# Patient Record
Sex: Female | Born: 1970 | Race: White | Hispanic: No | Marital: Single | Smoking: Current every day smoker
Health system: Southern US, Community
[De-identification: ages and names within clinical notes are randomized; demographics above are authoritative.]

## PROBLEM LIST (undated history)

## (undated) DIAGNOSIS — F1410 Cocaine abuse, uncomplicated: Secondary | ICD-10-CM

## (undated) DIAGNOSIS — F1110 Opioid abuse, uncomplicated: Secondary | ICD-10-CM

## (undated) DIAGNOSIS — K122 Cellulitis and abscess of mouth: Secondary | ICD-10-CM

## (undated) DIAGNOSIS — F1190 Opioid use, unspecified, uncomplicated: Secondary | ICD-10-CM

## (undated) DIAGNOSIS — F329 Major depressive disorder, single episode, unspecified: Secondary | ICD-10-CM

## (undated) DIAGNOSIS — F1120 Opioid dependence, uncomplicated: Secondary | ICD-10-CM

## (undated) DIAGNOSIS — I10 Essential (primary) hypertension: Secondary | ICD-10-CM

## (undated) DIAGNOSIS — F1310 Sedative, hypnotic or anxiolytic abuse, uncomplicated: Secondary | ICD-10-CM

## (undated) DIAGNOSIS — F1010 Alcohol abuse, uncomplicated: Secondary | ICD-10-CM

## (undated) DIAGNOSIS — F32A Depression, unspecified: Secondary | ICD-10-CM

## (undated) DIAGNOSIS — F419 Anxiety disorder, unspecified: Secondary | ICD-10-CM

## (undated) DIAGNOSIS — B1920 Unspecified viral hepatitis C without hepatic coma: Secondary | ICD-10-CM

---

## 1997-09-23 ENCOUNTER — Encounter: Admission: RE | Admit: 1997-09-23 | Discharge: 1997-09-23 | Payer: Self-pay | Admitting: Sports Medicine

## 1998-04-03 ENCOUNTER — Inpatient Hospital Stay (HOSPITAL_COMMUNITY): Admission: AD | Admit: 1998-04-03 | Discharge: 1998-04-03 | Payer: Self-pay | Admitting: Obstetrics

## 1998-04-05 ENCOUNTER — Ambulatory Visit (HOSPITAL_COMMUNITY): Admission: RE | Admit: 1998-04-05 | Discharge: 1998-04-05 | Payer: Self-pay | Admitting: Obstetrics

## 1999-09-07 ENCOUNTER — Emergency Department (HOSPITAL_COMMUNITY): Admission: EM | Admit: 1999-09-07 | Discharge: 1999-09-08 | Payer: Self-pay | Admitting: Emergency Medicine

## 2000-02-18 ENCOUNTER — Emergency Department (HOSPITAL_COMMUNITY): Admission: EM | Admit: 2000-02-18 | Discharge: 2000-02-18 | Payer: Self-pay

## 2000-08-21 ENCOUNTER — Encounter: Payer: Self-pay | Admitting: *Deleted

## 2000-08-21 ENCOUNTER — Inpatient Hospital Stay (HOSPITAL_COMMUNITY): Admission: AD | Admit: 2000-08-21 | Discharge: 2000-08-21 | Payer: Self-pay | Admitting: *Deleted

## 2000-11-07 ENCOUNTER — Inpatient Hospital Stay (HOSPITAL_COMMUNITY): Admission: AD | Admit: 2000-11-07 | Discharge: 2000-11-09 | Payer: Self-pay | Admitting: *Deleted

## 2000-11-07 ENCOUNTER — Encounter: Payer: Self-pay | Admitting: Obstetrics

## 2000-11-28 ENCOUNTER — Inpatient Hospital Stay (HOSPITAL_COMMUNITY): Admission: AD | Admit: 2000-11-28 | Discharge: 2000-11-30 | Payer: Self-pay | Admitting: *Deleted

## 2002-02-09 ENCOUNTER — Encounter: Payer: Self-pay | Admitting: Obstetrics and Gynecology

## 2002-02-09 ENCOUNTER — Inpatient Hospital Stay (HOSPITAL_COMMUNITY): Admission: AD | Admit: 2002-02-09 | Discharge: 2002-02-09 | Payer: Self-pay | Admitting: Obstetrics and Gynecology

## 2002-03-19 ENCOUNTER — Emergency Department (HOSPITAL_COMMUNITY): Admission: EM | Admit: 2002-03-19 | Discharge: 2002-03-19 | Payer: Self-pay | Admitting: Emergency Medicine

## 2002-05-13 ENCOUNTER — Emergency Department (HOSPITAL_COMMUNITY): Admission: EM | Admit: 2002-05-13 | Discharge: 2002-05-13 | Payer: Self-pay | Admitting: Emergency Medicine

## 2003-04-18 ENCOUNTER — Emergency Department (HOSPITAL_COMMUNITY): Admission: EM | Admit: 2003-04-18 | Discharge: 2003-04-18 | Payer: Self-pay | Admitting: Emergency Medicine

## 2003-06-03 ENCOUNTER — Emergency Department (HOSPITAL_COMMUNITY): Admission: EM | Admit: 2003-06-03 | Discharge: 2003-06-03 | Payer: Self-pay | Admitting: Emergency Medicine

## 2003-11-05 ENCOUNTER — Emergency Department (HOSPITAL_COMMUNITY): Admission: EM | Admit: 2003-11-05 | Discharge: 2003-11-05 | Payer: Self-pay | Admitting: Emergency Medicine

## 2004-02-18 ENCOUNTER — Emergency Department (HOSPITAL_COMMUNITY): Admission: EM | Admit: 2004-02-18 | Discharge: 2004-02-18 | Payer: Self-pay | Admitting: Emergency Medicine

## 2004-03-30 ENCOUNTER — Emergency Department (HOSPITAL_COMMUNITY): Admission: EM | Admit: 2004-03-30 | Discharge: 2004-03-30 | Payer: Self-pay | Admitting: Emergency Medicine

## 2004-06-26 ENCOUNTER — Emergency Department (HOSPITAL_COMMUNITY): Admission: EM | Admit: 2004-06-26 | Discharge: 2004-06-26 | Payer: Self-pay | Admitting: Emergency Medicine

## 2004-10-13 ENCOUNTER — Emergency Department (HOSPITAL_COMMUNITY): Admission: EM | Admit: 2004-10-13 | Discharge: 2004-10-13 | Payer: Self-pay | Admitting: Emergency Medicine

## 2004-11-18 ENCOUNTER — Emergency Department (HOSPITAL_COMMUNITY): Admission: EM | Admit: 2004-11-18 | Discharge: 2004-11-18 | Payer: Self-pay | Admitting: Emergency Medicine

## 2005-03-29 ENCOUNTER — Emergency Department (HOSPITAL_COMMUNITY): Admission: EM | Admit: 2005-03-29 | Discharge: 2005-03-29 | Payer: Self-pay | Admitting: Family Medicine

## 2005-11-28 ENCOUNTER — Emergency Department (HOSPITAL_COMMUNITY): Admission: EM | Admit: 2005-11-28 | Discharge: 2005-11-28 | Payer: Self-pay | Admitting: Emergency Medicine

## 2008-07-05 ENCOUNTER — Emergency Department (HOSPITAL_COMMUNITY): Admission: EM | Admit: 2008-07-05 | Discharge: 2008-07-05 | Payer: Self-pay | Admitting: Emergency Medicine

## 2008-09-15 ENCOUNTER — Emergency Department (HOSPITAL_COMMUNITY): Admission: EM | Admit: 2008-09-15 | Discharge: 2008-09-15 | Payer: Self-pay | Admitting: Emergency Medicine

## 2008-11-05 ENCOUNTER — Emergency Department (HOSPITAL_COMMUNITY): Admission: EM | Admit: 2008-11-05 | Discharge: 2008-11-05 | Payer: Self-pay | Admitting: Emergency Medicine

## 2009-09-05 ENCOUNTER — Emergency Department (HOSPITAL_COMMUNITY): Admission: EM | Admit: 2009-09-05 | Discharge: 2009-09-05 | Payer: Self-pay | Admitting: Emergency Medicine

## 2009-09-30 ENCOUNTER — Emergency Department (HOSPITAL_COMMUNITY): Admission: EM | Admit: 2009-09-30 | Discharge: 2009-09-30 | Payer: Self-pay | Admitting: Emergency Medicine

## 2009-10-11 ENCOUNTER — Emergency Department (HOSPITAL_COMMUNITY): Admission: EM | Admit: 2009-10-11 | Discharge: 2009-10-11 | Payer: Self-pay | Admitting: Emergency Medicine

## 2009-10-22 ENCOUNTER — Emergency Department (HOSPITAL_COMMUNITY): Admission: EM | Admit: 2009-10-22 | Discharge: 2009-10-22 | Payer: Self-pay | Admitting: Emergency Medicine

## 2009-11-02 ENCOUNTER — Emergency Department (HOSPITAL_COMMUNITY): Admission: EM | Admit: 2009-11-02 | Discharge: 2009-11-02 | Payer: Self-pay | Admitting: Emergency Medicine

## 2009-11-27 ENCOUNTER — Emergency Department (HOSPITAL_COMMUNITY): Admission: EM | Admit: 2009-11-27 | Discharge: 2009-11-27 | Payer: Self-pay | Admitting: Emergency Medicine

## 2009-12-08 ENCOUNTER — Emergency Department (HOSPITAL_COMMUNITY): Admission: EM | Admit: 2009-12-08 | Discharge: 2009-12-09 | Payer: Self-pay | Admitting: Emergency Medicine

## 2010-09-04 LAB — COMPREHENSIVE METABOLIC PANEL
ALT: 24 U/L (ref 0–35)
Albumin: 3.5 g/dL (ref 3.5–5.2)
CO2: 23 mEq/L (ref 19–32)
Chloride: 108 mEq/L (ref 96–112)
GFR calc non Af Amer: >60 mL/min (ref >60)
Glucose, Bld: 89 mg/dL (ref 70–99)
Potassium: 3.9 mEq/L (ref 3.5–5.1)
Sodium: 138 mEq/L (ref 135–145)
Total Bilirubin: 0.3 mg/dL (ref 0.3–1.2)
Total Protein: 7.8 g/dL (ref 6.0–8.3)

## 2010-09-04 LAB — URINALYSIS, ROUTINE W REFLEX MICROSCOPIC
Glucose, UA: NEGATIVE mg/dL
Ketones, ur: NEGATIVE mg/dL
Leukocytes, UA: NEGATIVE
Nitrite: NEGATIVE
Protein, ur: NEGATIVE mg/dL
Urobilinogen, UA: 0.2 mg/dL (ref 0.0–1.0)

## 2010-09-04 LAB — CBC
RDW: 12.7 % (ref 11.5–15.5)
WBC: 8.3 10*3/uL (ref 4.0–10.5)

## 2010-09-04 LAB — DIFFERENTIAL
Eosinophils Absolute: 0 10*3/uL (ref 0.0–0.7)
Lymphs Abs: 2.2 10*3/uL (ref 0.7–4.0)
Neutro Abs: 5.5 10*3/uL (ref 1.7–7.7)
Neutrophils Relative %: 65 % (ref 43–77)

## 2010-09-04 LAB — POCT PREGNANCY, URINE: Preg Test, Ur: NEGATIVE

## 2010-09-06 LAB — URINE MICROSCOPIC-ADD ON

## 2010-09-06 LAB — URINALYSIS, ROUTINE W REFLEX MICROSCOPIC
Nitrite: POSITIVE — AB
Protein, ur: 30 mg/dL — AB
Urobilinogen, UA: 2 mg/dL — ABNORMAL HIGH (ref 0.0–1.0)
pH: 5 (ref 5.0–8.0)

## 2010-09-06 LAB — PREGNANCY, URINE: Preg Test, Ur: NEGATIVE

## 2010-09-29 LAB — DIFFERENTIAL
Band Neutrophils: 0 % (ref 0–10)
Basophils Absolute: 0 10*3/uL (ref 0.0–0.1)
Eosinophils Absolute: 0 10*3/uL (ref 0.0–0.7)
Lymphs Abs: 2.3 10*3/uL (ref 0.7–4.0)
Monocytes Absolute: 0.6 10*3/uL (ref 0.1–1.0)
Monocytes Relative: 10 % (ref 3–12)
Neutro Abs: 3.4 10*3/uL (ref 1.7–7.7)
Neutrophils Relative %: 53 % (ref 43–77)
Promyelocytes Absolute: 0 %

## 2010-09-29 LAB — BASIC METABOLIC PANEL
CO2: 23 mEq/L (ref 19–32)
Creatinine, Ser: 0.66 mg/dL (ref 0.4–1.2)
GFR calc Af Amer: >60 mL/min (ref >60)
Glucose, Bld: 134 mg/dL — ABNORMAL HIGH (ref 70–99)

## 2010-09-29 LAB — CBC
Hemoglobin: 11.5 g/dL — ABNORMAL LOW (ref 12.0–15.0)
Platelets: 258 10*3/uL (ref 150–400)
RDW: 13.5 % (ref 11.5–15.5)

## 2011-06-08 ENCOUNTER — Encounter: Payer: Self-pay | Admitting: *Deleted

## 2011-06-08 ENCOUNTER — Emergency Department (HOSPITAL_COMMUNITY)
Admission: EM | Admit: 2011-06-08 | Discharge: 2011-06-08 | Disposition: A | Discharge status: Home / Self Care | Payer: Self-pay | Attending: Emergency Medicine | Admitting: Emergency Medicine

## 2011-06-08 DIAGNOSIS — H9209 Otalgia, unspecified ear: Secondary | ICD-10-CM | POA: Insufficient documentation

## 2011-06-08 DIAGNOSIS — F17200 Nicotine dependence, unspecified, uncomplicated: Secondary | ICD-10-CM | POA: Insufficient documentation

## 2011-06-08 DIAGNOSIS — K089 Disorder of teeth and supporting structures, unspecified: Secondary | ICD-10-CM | POA: Insufficient documentation

## 2011-06-08 DIAGNOSIS — K0889 Other specified disorders of teeth and supporting structures: Secondary | ICD-10-CM

## 2011-06-08 MED ORDER — PENICILLIN V POTASSIUM 500 MG PO TABS: 500.0000 mg | ORAL_TABLET | Freq: Three times a day (TID) | ORAL | Status: AC

## 2011-06-08 MED ORDER — HYDROCODONE-ACETAMINOPHEN 5-325 MG PO TABS: ORAL_TABLET | ORAL | Status: AC

## 2011-06-08 MED ORDER — IBUPROFEN 800 MG PO TABS: 800.0000 mg | ORAL_TABLET | Freq: Three times a day (TID) | ORAL | Status: AC | PRN

## 2011-06-08 NOTE — ED Provider Notes (Signed)
History     CSN: [id number]  Arrival date & time 06/08/11  1510   First MD Initiated Contact with Patient 06/08/11 1733      Chief Complaint  Patient presents with  . Dental Pain    (Consider location/radiation/quality/duration/timing/severity/associated sxs/prior treatment) HPI Comments: Patient with poor dentition presents with worsening sensation of swelling in right maxillary jaw and tooth pain in right mandibular jaw. She is requesting antibiotics to treat abscess. Patient has had multiple teeth extracted in the past. She denies throat swelling or trouble breathing.  Patient is a 40 y.o. female presenting with tooth pain. The history is provided by the patient.  Dental PainThe primary symptoms include mouth pain. Primary symptoms do not include dental injury, headaches, fever, shortness of breath or sore throat. The symptoms began 2 days ago. The symptoms are worsening.  Additional symptoms include: gum swelling, gum tenderness and ear pain. Additional symptoms do not include: trismus, facial swelling and trouble swallowing.    History reviewed. No pertinent past medical history.  History reviewed. No pertinent past surgical history.  No family history on file.  History  Substance Use Topics  . Smoking status: Current Everyday Smoker  . Smokeless tobacco: Not on file  . Alcohol Use: No    OB History    Grav Para Term Preterm Abortions TAB SAB Ect Mult Living                  Review of Systems  Constitutional: Negative for fever.  HENT: Positive for ear pain. Negative for sore throat, facial swelling, rhinorrhea and trouble swallowing.        Positive for dental pain  Eyes: Negative for discharge.  Respiratory: Negative for shortness of breath.   Skin: Negative for color change.  Neurological: Negative for headaches.    Allergies  Review of patient's allergies indicates no known allergies.  Home Medications   Current Outpatient Rx  Name Route Sig Dispense  Refill  . ACETAMINOPHEN 500 MG PO TABS Oral Take 500 mg by mouth every 6 (six) hours as needed. pain     . HYDROCODONE-ACETAMINOPHEN 5-325 MG PO TABS  Take 1-2 tablets every 6 hours as needed for severe pain 10 tablet 0  . IBUPROFEN 800 MG PO TABS Oral Take 1 tablet (800 mg total) by mouth every 8 (eight) hours as needed for pain. 15 tablet 0  . PENICILLIN V POTASSIUM 500 MG PO TABS Oral Take 1 tablet (500 mg total) by mouth 3 (three) times daily. 21 tablet 0    BP 134/89  Pulse 87  Temp(Src) 98.7 F (37.1 C) (Oral)  Resp 18  SpO2 100%  LMP 05/29/2011  Physical Exam  Nursing note and vitals reviewed. Constitutional: She is oriented to person, place, and time. She appears well-developed and well-nourished.  HENT:  Head: Normocephalic and atraumatic.  Right Ear: External ear normal.  Left Ear: External ear normal.       Patient with R maxillary/mandibular tooth pain and tenderness to palpation in area of right mandibular molar and right maxillary first premolar. No swelling or erythema noted on exam. Teeth are in poor repair, multiple extractions.  Eyes: Conjunctivae are normal.  Neck: Normal range of motion. Neck supple.       No Ludwig's angina  Lymphadenopathy:    She has no cervical adenopathy.  Neurological: She is alert and oriented to person, place, and time.  Skin: Skin is warm and dry.    ED Course  Procedures (including  critical care time)  Labs Reviewed - No data to display No results found.   1. Pain, dental    6:01 PM Patient counseled to take prescribed medications as directed, return with worsening facial or neck swelling, and to follow-up with his dentist as soon as possible.   6:01 PM Patient counseled on use of narcotic pain medications. Counseled not to combine these medications with others containing tylenol. Urged not to drink alcohol, drive, or perform any other activities that requires focus while taking these medications. The patient verbalizes  understanding and agrees with the plan.    MDM  Patient with toothache.  No gross abscess.  Exam unconcerning for Ludwig's angina or other deep tissue infection in neck.  Will treat with penicillin and pain medicine.  Urged patient to follow-up with dentist.          Carolee Rota, PA 06/08/11 [phone]

## 2011-06-08 NOTE — ED Notes (Signed)
Pt states she has a toothache on the right side. Pt states she need abx for her tooth because she can not afford to go to the denitist

## 2011-06-09 NOTE — ED Provider Notes (Signed)
Medical screening examination/treatment/procedure(s) were performed by non-physician practitioner and as supervising physician I was immediately available for consultation/collaboration.   Shelda Jakes, MD 06/09/11 1630

## 2012-02-09 ENCOUNTER — Encounter (HOSPITAL_COMMUNITY): Payer: Self-pay | Admitting: Emergency Medicine

## 2012-02-09 ENCOUNTER — Emergency Department (HOSPITAL_COMMUNITY)
Admission: EM | Admit: 2012-02-09 | Discharge: 2012-02-09 | Disposition: A | Discharge status: Home / Self Care | Payer: Self-pay | Attending: Emergency Medicine | Admitting: Emergency Medicine

## 2012-02-09 DIAGNOSIS — F17200 Nicotine dependence, unspecified, uncomplicated: Secondary | ICD-10-CM | POA: Insufficient documentation

## 2012-02-09 DIAGNOSIS — K089 Disorder of teeth and supporting structures, unspecified: Secondary | ICD-10-CM | POA: Insufficient documentation

## 2012-02-09 DIAGNOSIS — K0889 Other specified disorders of teeth and supporting structures: Secondary | ICD-10-CM

## 2012-02-09 HISTORY — DX: Cellulitis and abscess of mouth: K12.2

## 2012-02-09 MED ORDER — OXYCODONE-ACETAMINOPHEN 5-325 MG PO TABS: 2.0000 | ORAL_TABLET | ORAL | Status: AC | PRN

## 2012-02-09 MED ORDER — PENICILLIN V POTASSIUM 500 MG PO TABS: 500.0000 mg | ORAL_TABLET | Freq: Four times a day (QID) | ORAL | Status: AC

## 2012-02-09 MED ORDER — OXYCODONE-ACETAMINOPHEN 5-325 MG PO TABS
2.0000 | ORAL_TABLET | Freq: Once | ORAL | Status: AC
  Administered 2012-02-09: 2 via ORAL
  Filled 2012-02-09: qty 2

## 2012-02-09 NOTE — ED Notes (Signed)
Patient states she has 2 abscessed teeth but doesn't have insurance and has not seen a dentist

## 2012-02-09 NOTE — ED Provider Notes (Signed)
History     CSN: [id number]  Arrival date & time 02/09/12  1258   First MD Initiated Contact with Patient 02/09/12 1510      Chief Complaint  Patient presents with  . Dental Pain    (Consider location/radiation/quality/duration/timing/severity/associated sxs/prior treatment) Patient is a 41 y.o. female presenting with tooth pain.  Dental PainPrimary symptoms do not include fever or shortness of breath.  Additional symptoms do not include: trouble swallowing.   Alyssa Kelley 41 y.o. female   The chief complaint is:  Chief Complaint  Patient presents with  . Dental Pain    41 year old female with a history of poor dentition and previous dental abscess he presents today with a chief complaint of dental pain. She states that she has several cracked teeth in her mouth. She has pain in the right posterior upper molar and right inferior molar. She denies any discharge from the area. She denies any radiating pain into her ear or jaw. She rates her pain right now to 7/10. She denies any difficulty breathing or swallowing. She denies any fevers shakes chills arthralgias myalgias or other constitutional symptoms indicatung systemic infection     Past Medical History  Diagnosis Date  . Abscess of mouth     History reviewed. No pertinent past surgical history.  History reviewed. No pertinent family history.  History  Substance Use Topics  . Smoking status: Current Everyday Smoker  . Smokeless tobacco: Not on file  . Alcohol Use: No    OB History    Grav Para Term Preterm Abortions TAB SAB Ect Mult Living                  Review of Systems  Constitutional: Negative for fever and chills.  HENT: Negative for trouble swallowing.   Respiratory: Negative for shortness of breath.   Cardiovascular: Negative for chest pain.  Neurological: Negative for light-headedness.    Allergies  Review of patient's allergies indicates no known allergies.  Home Medications   Current  Outpatient Rx  Name Route Sig Dispense Refill  . ACETAMINOPHEN 500 MG PO TABS Oral Take 1,000 mg by mouth every 6 (six) hours as needed. pain    . OXYCODONE-ACETAMINOPHEN 5-325 MG PO TABS Oral Take 2 tablets by mouth every 4 (four) hours as needed for pain. 10 tablet 0  . PENICILLIN V POTASSIUM 500 MG PO TABS Oral Take 1 tablet (500 mg total) by mouth 4 (four) times daily. 40 tablet 0    BP 111/80  Pulse 72  Temp 98.1 F (36.7 C) (Oral)  Resp 20  Ht 4\' 11"  (1.499 m)  Wt 150 lb (68.04 kg)  BMI 30.30 kg/m2  SpO2 97%  LMP 02/07/2012  Physical Exam  Vitals reviewed. Constitutional: She is oriented to person, place, and time. She appears well-developed and well-nourished. No distress.  HENT:  Head: Normocephalic and atraumatic.       Poor dentition with multiple broken teeth. No fluctuance noted in the gumline. Conjunctiva are red and inflamed.  she is tender to palpation. No swelling or pharyngeal erythema noted.  Eyes: Conjunctivae are normal. No scleral icterus.  Neck: Normal range of motion.  Cardiovascular: Normal rate, regular rhythm and normal heart sounds.  Exam reveals no gallop and no friction rub.   No murmur heard. Pulmonary/Chest: Effort normal and breath sounds normal. No respiratory distress.  Abdominal: Soft. Bowel sounds are normal. There is no tenderness.  Lymphadenopathy:    She has no cervical adenopathy.  Neurological: She is alert and oriented to person, place, and time.  Skin: Skin is warm and dry. She is not diaphoretic.    ED Course  Procedures (including critical care time)  Labs Reviewed - No data to display No results found.   1. Pain, dental    Patient with dental pain. Poor dentition. I will discharge the patient with Percocet for her pain as well as a ten-day course of penicillin. I have advised her to call the doctor on followup to see her about her teeth. I've also provided the patient with a list of dental resources in the community. All  questions answered fully. Patient agrees with plan.   MDM  Patient safe for discharge. Discussed reasons to seek immediate care. Patient expresses understanding and agrees with plan.           Arthor Captain, PA-C 02/09/12 1641

## 2012-02-10 NOTE — ED Provider Notes (Signed)
Medical screening examination/treatment/procedure(s) were performed by non-physician practitioner and as supervising physician I was immediately available for consultation/collaboration.   Lyanne Co, MD 02/10/12 [phone]

## 2012-11-29 ENCOUNTER — Emergency Department (HOSPITAL_COMMUNITY): Payer: Self-pay

## 2012-11-29 ENCOUNTER — Emergency Department (HOSPITAL_COMMUNITY)
Admission: EM | Admit: 2012-11-29 | Discharge: 2012-11-29 | Disposition: A | Discharge status: Home / Self Care | Payer: Self-pay | Attending: Emergency Medicine | Admitting: Emergency Medicine

## 2012-11-29 ENCOUNTER — Encounter (HOSPITAL_COMMUNITY): Payer: Self-pay | Admitting: Emergency Medicine

## 2012-11-29 DIAGNOSIS — X500XXA Overexertion from strenuous movement or load, initial encounter: Secondary | ICD-10-CM | POA: Insufficient documentation

## 2012-11-29 DIAGNOSIS — Y929 Unspecified place or not applicable: Secondary | ICD-10-CM | POA: Insufficient documentation

## 2012-11-29 DIAGNOSIS — S82899A Other fracture of unspecified lower leg, initial encounter for closed fracture: Secondary | ICD-10-CM | POA: Insufficient documentation

## 2012-11-29 DIAGNOSIS — Y939 Activity, unspecified: Secondary | ICD-10-CM | POA: Insufficient documentation

## 2012-11-29 DIAGNOSIS — F17200 Nicotine dependence, unspecified, uncomplicated: Secondary | ICD-10-CM | POA: Insufficient documentation

## 2012-11-29 DIAGNOSIS — S82839A Other fracture of upper and lower end of unspecified fibula, initial encounter for closed fracture: Secondary | ICD-10-CM

## 2012-11-29 DIAGNOSIS — Z8719 Personal history of other diseases of the digestive system: Secondary | ICD-10-CM | POA: Insufficient documentation

## 2012-11-29 MED ORDER — HYDROCODONE-ACETAMINOPHEN 5-325 MG PO TABS: 1.0000 | ORAL_TABLET | ORAL | Status: DC | PRN

## 2012-11-29 MED ORDER — HYDROCODONE-ACETAMINOPHEN 5-325 MG PO TABS
2.0000 | ORAL_TABLET | Freq: Once | ORAL | Status: AC
  Administered 2012-11-29: 2 via ORAL
  Filled 2012-11-29: qty 2

## 2012-11-29 NOTE — Progress Notes (Signed)
[facility] has seen patient. Patient stated that she tried to get the [facility], but left because she had to wait to long. Patient stated she lived with a friend at the time but was told to go to [facility] because housing was not perminant. I provided her with primary care resources highlighting [facility] in case she needed the information again. I also provided her with a [facility] application and a notarized letter form just in case there is anyone who currently helps her financially.

## 2012-11-29 NOTE — ED Provider Notes (Signed)
  Medical screening examination/treatment/procedure(s) were performed by non-physician practitioner and as supervising physician I was immediately available for consultation/collaboration.   Tamea Bai, MD 11/29/12 1630 

## 2012-11-29 NOTE — ED Provider Notes (Signed)
History     CSN: [id number]  Arrival date & time 11/29/12  1221   First MD Initiated Contact with Patient 11/29/12 1227      Chief Complaint  Patient presents with  . Ankle Pain    3 day hx of r/ankle pain,     (Consider location/radiation/quality/duration/timing/severity/associated sxs/prior treatment) HPI Comments: Pt states that she twisted her right ankle 3 days ago and she has continued to have pain and swelling to there area on the right lateral area  Patient is a 42 y.o. female presenting with ankle pain. The history is provided by the patient. No language interpreter was used.  Ankle Pain Location:  Ankle Time since incident:  3 days Injury: yes   Mechanism of injury comment:  Twisting Ankle location:  R ankle Pain details:    Quality:  Aching   Radiates to:  Does not radiate   Severity:  Moderate   Past Medical History  Diagnosis Date  . Abscess of mouth     History reviewed. No pertinent past surgical history.  History reviewed. No pertinent family history.  History  Substance Use Topics  . Smoking status: Current Every Day Smoker    Types: Cigarettes  . Smokeless tobacco: Not on file  . Alcohol Use: No    OB History   Grav Para Term Preterm Abortions TAB SAB Ect Mult Living                  Review of Systems  Constitutional: Negative.   Respiratory: Negative.   Cardiovascular: Negative.     Allergies  Review of patient's allergies indicates no known allergies.  Home Medications   Current Outpatient Rx  Name  Route  Sig  Dispense  Refill  . acetaminophen (TYLENOL) 500 MG tablet   Oral   Take 1,000 mg by mouth every 6 (six) hours as needed. pain           BP 113/80  Pulse 89  Temp(Src) 98.4 F (36.9 C) (Oral)  Resp 16  Wt 185 lb (83.915 kg)  BMI 37.35 kg/m2  SpO2 98%  LMP 10/15/2012  Physical Exam  Nursing note and vitals reviewed. Constitutional: She is oriented to person, place, and time. She appears well-developed and  well-nourished.  HENT:  Head: Normocephalic and atraumatic.  Cardiovascular: Normal rate and regular rhythm.   Pulmonary/Chest: Effort normal and breath sounds normal.  Musculoskeletal: Normal range of motion.  Swelling noted to the right lateral ankle  Neurological: She is alert and oriented to person, place, and time.  Skin: Skin is warm and dry.    ED Course  Procedures (including critical care time)  Labs Reviewed - No data to display Dg Ankle Complete Right  11/29/2012   *RADIOLOGY REPORT*  Clinical Data: Ankle pain post twisted ankle 2 days ago  RIGHT ANKLE - COMPLETE 3+ VIEW  Comparison: None.  Findings: Three views of the right ankle submitted.  There is nondisplaced fracture in distal fibula.  Ankle mortise is preserved.  Soft tissue swelling adjacent to lateral malleolus.  IMPRESSION: Nondisplaced fracture distal fibula with adjacent soft tissue swelling.  Ankle mortise is preserved.   Original Report Authenticated By: Natasha Mead, M.D.     1. Fracture of distal fibula       MDM  Pt splinted and given crutches:pt given vicodin for pain:pt to follow up with DR. Yates with ortho       Teressa Lower, NP 11/29/12 1530

## 2012-11-29 NOTE — ED Notes (Signed)
Patient transported to X-ray 

## 2012-11-29 NOTE — ED Notes (Signed)
Ortho tech called 

## 2012-11-29 NOTE — Progress Notes (Signed)
Orthopedic Tech Progress Note Patient Details:  Alyssa Kelley 06-Jan-1971 [id number] Applied stirrup splint to RLE.  Fitted crutches and instructed pt. In use of same. Ortho Devices Type of Ortho Device: Crutches;Stirrup splint Ortho Device/Splint Location: RLE Ortho Device/Splint Interventions: Application   Lesle Chris 11/29/2012, 2:51 PM

## 2012-11-29 NOTE — Progress Notes (Signed)
During [facility] ED 11/29/12 visit CM spoke with pt who confirms self pay [facility] resident with no pcp. CM discussed and provided written information for self pay pcps, importance of pcp for f/u care, www.needymeds.org, discounted pharmacies and other [place] resources such as financial assistance, DSS and  health department  Reviewed resources for [employer] self pay pcps like [facility], family medicine at [address], [facility] family practice, general medical clinics, [facility] urgent care plus others, [facility] out patient pharmacies and housing Pt voiced understanding and appreciation of resources provided  Pt states she was informed to go to [facility] to get "orange card" but has not been to [facility] yeat

## 2012-11-29 NOTE — ED Notes (Signed)
Pt reports that she twisted r/ankle 3 days ago. Pain unresponsive to Tylenol

## 2013-09-09 ENCOUNTER — Encounter (HOSPITAL_COMMUNITY): Payer: Self-pay | Admitting: Emergency Medicine

## 2013-09-09 ENCOUNTER — Emergency Department (HOSPITAL_COMMUNITY)
Admission: EM | Admit: 2013-09-09 | Discharge: 2013-09-10 | Disposition: A | Discharge status: Home / Self Care | Payer: Self-pay | Attending: Emergency Medicine | Admitting: Emergency Medicine

## 2013-09-09 DIAGNOSIS — Z91199 Patient's noncompliance with other medical treatment and regimen due to unspecified reason: Secondary | ICD-10-CM | POA: Insufficient documentation

## 2013-09-09 DIAGNOSIS — F1410 Cocaine abuse, uncomplicated: Secondary | ICD-10-CM | POA: Diagnosis present

## 2013-09-09 DIAGNOSIS — F1010 Alcohol abuse, uncomplicated: Secondary | ICD-10-CM | POA: Diagnosis present

## 2013-09-09 DIAGNOSIS — Z9119 Patient's noncompliance with other medical treatment and regimen: Secondary | ICD-10-CM | POA: Insufficient documentation

## 2013-09-09 DIAGNOSIS — Z8659 Personal history of other mental and behavioral disorders: Secondary | ICD-10-CM | POA: Insufficient documentation

## 2013-09-09 DIAGNOSIS — F1110 Opioid abuse, uncomplicated: Secondary | ICD-10-CM | POA: Diagnosis present

## 2013-09-09 DIAGNOSIS — Z872 Personal history of diseases of the skin and subcutaneous tissue: Secondary | ICD-10-CM | POA: Insufficient documentation

## 2013-09-09 DIAGNOSIS — F1910 Other psychoactive substance abuse, uncomplicated: Secondary | ICD-10-CM

## 2013-09-09 DIAGNOSIS — F17200 Nicotine dependence, unspecified, uncomplicated: Secondary | ICD-10-CM | POA: Insufficient documentation

## 2013-09-09 DIAGNOSIS — Z79899 Other long term (current) drug therapy: Secondary | ICD-10-CM | POA: Insufficient documentation

## 2013-09-09 NOTE — ED Notes (Signed)
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  Pt denies SI and HI.  Pt is alert and oriented NAD

## 2013-09-10 ENCOUNTER — Encounter (HOSPITAL_COMMUNITY): Payer: Self-pay | Admitting: Emergency Medicine

## 2013-09-10 DIAGNOSIS — F1910 Other psychoactive substance abuse, uncomplicated: Secondary | ICD-10-CM

## 2013-09-10 DIAGNOSIS — F1010 Alcohol abuse, uncomplicated: Secondary | ICD-10-CM | POA: Diagnosis present

## 2013-09-10 DIAGNOSIS — F1110 Opioid abuse, uncomplicated: Secondary | ICD-10-CM | POA: Diagnosis present

## 2013-09-10 DIAGNOSIS — F1410 Cocaine abuse, uncomplicated: Secondary | ICD-10-CM | POA: Diagnosis present

## 2013-09-10 LAB — CBC WITH DIFFERENTIAL/PLATELET
BASOS PCT: 0 % (ref 0–1)
Basophils Absolute: 0 10*3/uL (ref 0.0–0.1)
EOS ABS: 0 10*3/uL (ref 0.0–0.7)
Eosinophils Relative: 0 % (ref 0–5)
HCT: 38.4 % (ref 36.0–46.0)
Hemoglobin: 12.5 g/dL (ref 12.0–15.0)
LYMPHS ABS: 2.1 10*3/uL (ref 0.7–4.0)
Lymphocytes Relative: 22 % (ref 12–46)
MCH: 29.4 pg (ref 26.0–34.0)
MCHC: 32.6 g/dL (ref 30.0–36.0)
MCV: 90.4 fL (ref 78.0–100.0)
Monocytes Absolute: 0.8 10*3/uL (ref 0.1–1.0)
Monocytes Relative: 8 % (ref 3–12)
NEUTROS PCT: 70 % (ref 43–77)
Neutro Abs: 6.6 10*3/uL (ref 1.7–7.7)
Platelets: 264 10*3/uL (ref 150–400)
RBC: 4.25 MIL/uL (ref 3.87–5.11)
RDW: 13.5 % (ref 11.5–15.5)
WBC: 9.4 10*3/uL (ref 4.0–10.5)

## 2013-09-10 LAB — RAPID URINE DRUG SCREEN, HOSP PERFORMED
AMPHETAMINES: NOT DETECTED
BARBITURATES: NOT DETECTED
BENZODIAZEPINES: NOT DETECTED
Cocaine: POSITIVE — AB
Opiates: NOT DETECTED
Tetrahydrocannabinol: NOT DETECTED

## 2013-09-10 LAB — ETHANOL: Alcohol, Ethyl (B): <11 mg/dL (ref 0–11)

## 2013-09-10 LAB — COMPREHENSIVE METABOLIC PANEL WITH GFR
ALT: 46 U/L — ABNORMAL HIGH (ref 0–35)
AST: 53 U/L — ABNORMAL HIGH (ref 0–37)
Albumin: 3.2 g/dL — ABNORMAL LOW (ref 3.5–5.2)
Alkaline Phosphatase: 99 U/L (ref 39–117)
BUN: 9 mg/dL (ref 6–23)
CO2: 28 meq/L (ref 19–32)
Calcium: 9.6 mg/dL (ref 8.4–10.5)
Chloride: 95 meq/L — ABNORMAL LOW (ref 96–112)
Creatinine, Ser: 0.67 mg/dL (ref 0.50–1.10)
GFR calc Af Amer: >90 mL/min
GFR calc non Af Amer: >90 mL/min
Glucose, Bld: 117 mg/dL — ABNORMAL HIGH (ref 70–99)
Potassium: 3.5 meq/L — ABNORMAL LOW (ref 3.7–5.3)
Sodium: 136 meq/L — ABNORMAL LOW (ref 137–147)
Total Bilirubin: 0.3 mg/dL (ref 0.3–1.2)
Total Protein: 8.4 g/dL — ABNORMAL HIGH (ref 6.0–8.3)

## 2013-09-10 MED ORDER — NICOTINE 21 MG/24HR TD PT24
21.0000 mg | MEDICATED_PATCH | Freq: Every day | TRANSDERMAL | Status: DC
  Administered 2013-09-10: 21 mg via TRANSDERMAL
  Filled 2013-09-10: qty 1

## 2013-09-10 MED ORDER — LORAZEPAM 1 MG PO TABS: 0.0000 mg | ORAL_TABLET | Freq: Two times a day (BID) | ORAL | Status: DC

## 2013-09-10 MED ORDER — VITAMIN B-1 100 MG PO TABS
100.0000 mg | ORAL_TABLET | Freq: Every day | ORAL | Status: DC
  Administered 2013-09-10: 100 mg via ORAL
  Filled 2013-09-10: qty 1

## 2013-09-10 MED ORDER — IBUPROFEN 200 MG PO TABS: 600.0000 mg | ORAL_TABLET | Freq: Three times a day (TID) | ORAL | Status: DC | PRN

## 2013-09-10 MED ORDER — THIAMINE HCL 100 MG/ML IJ SOLN: 100.0000 mg | Freq: Every day | INTRAMUSCULAR | Status: DC

## 2013-09-10 MED ORDER — LORAZEPAM 1 MG PO TABS
0.0000 mg | ORAL_TABLET | Freq: Four times a day (QID) | ORAL | Status: DC
  Administered 2013-09-10: 1 mg via ORAL
  Filled 2013-09-10: qty 1

## 2013-09-10 MED ORDER — ALUM & MAG HYDROXIDE-SIMETH 200-200-20 MG/5ML PO SUSP: 30.0000 mL | ORAL | Status: DC | PRN

## 2013-09-10 MED ORDER — ONDANSETRON HCL 4 MG PO TABS: 4.0000 mg | ORAL_TABLET | Freq: Three times a day (TID) | ORAL | Status: DC | PRN

## 2013-09-10 NOTE — Discharge Instructions (Signed)
Alcohol and Nutrition °Nutrition serves two purposes. It provides energy. It also maintains body structure and function. Food supplies energy. It also provides the building blocks needed to replace worn or damaged cells. Alcoholics often eat poorly. This limits their supply of essential nutrients. This affects energy supply and structure maintenance. Alcohol also affects the body's nutrients in: °· Digestion. °· Storage. °· Using and getting rid of waste products. °IMPAIRMENT OF NUTRIENT DIGESTION AND UTILIZATION  °· Once ingested, food must be broken down into small components (digested). Then it is available for energy. It helps maintain body structure and function. Digestion begins in the mouth. It continues in the stomach and intestines, with help from the pancreas. The nutrients from digested food are absorbed from the intestines into the blood. Then they are carried to the liver. The liver prepares nutrients for: °· Immediate use. °· Storage and future use. °· Alcohol inhibits the breakdown of nutrients into usable molecules. °· It decreases secretion of digestive enzymes from the pancreas. °· Alcohol impairs nutrient absorption by damaging the cells lining the stomach and intestines. °· It also interferes with moving some nutrients into the blood. °· In addition, nutritional deficiencies themselves may lead to further absorption problems. °· For example, folate deficiency changes the cells that line the small intestine. This impairs how water is absorbed. It also affects absorbed nutrients. These include glucose, sodium, and additional folate. °· Even if nutrients are digested and absorbed, alcohol can prevent them from being fully used. It changes their transport, storage, and excretion. Impaired utilization of nutrients by alcoholics is indicated by: °· Decreased liver stores of vitamins, such as vitamin A. °· Increased excretion of nutrients such as fat. °ALCOHOL AND ENERGY SUPPLY  °· Three basic  nutritional components found in food are: °· Carbohydrates. °· Proteins. °· Fats. °· These are used as energy. Some alcoholics take in as much as 50% of their total daily calories from alcohol. They often neglect important foods. °· Even when enough food is eaten, alcohol can impair the ways the body controls blood sugar (glucose) levels. It may either increase or decrease blood sugar. °· In non-diabetic alcoholics, increased blood sugar (hyperglycemia) is caused by poor insulin secretion. It is usually temporary. °· Decreased blood sugar (hypoglycemia) can cause serious injury even if this condition is short-lived. Low blood sugar can happen when a fasting or malnourished person drinks alcohol. When there is no food to supply energy, stored sugar is used up. The products of alcohol inhibit forming glucose from other compounds such as amino acids. As a result, alcohol causes the brain and other body tissue to lack glucose. It is needed for energy and function. °· Alcohol is an energy source. But how the body processes and uses the energy from alcohol is complex. Also, when alcohol is substituted for carbohydrates, subjects tend to lose weight. This indicates that they get less energy from alcohol than from food. °ALCOHOL - MAINTAINING CELL STRUCTURE AND FUNCTION  °Structure °Cells are made mostly of protein. So an adequate protein diet is important for maintaining cell structure. This is especially true if cells are being damaged. Research indicates that alcohol affects protein nutrition by causing impaired: °· Digestion of proteins to amino acids. °· Processing of amino acids by the small intestine and liver. °· Synthesis of proteins from amino acids. °· Protein secretion by the liver. °Function °Nutrients are essential for the body to function well. They provide the tools that the body needs to work well:  °·   Proteins.  Vitamins.  Minerals. Alcohol can disrupt body function. It may cause nutrient  deficiencies. And it may interfere with the way nutrients are processed. Vitamins  Vitamins are essential to maintain growth and normal metabolism. They regulate many of the body`s processes. Chronic heavy drinking causes deficiencies in many vitamins. This is caused by eating less. And, in some cases, vitamins may be poorly absorbed. For example, alcohol inhibits fat absorption. It impairs how the vitamins A, E, and D are normally absorbed along with dietary fats. Not enough vitamin A may cause night blindness. Not enough vitamin D may cause softening of the bones.  Some alcoholics lack vitamins A, C, D, E, K, and the B vitamins. These are all involved in wound healing and cell maintenance. In particular, because vitamin K is necessary for blood clotting, lacking that vitamin can cause delayed clotting. The result is excess bleeding. Lacking other vitamins involved in brain function may cause severe neurological damage. Minerals Deficiencies of minerals such as calcium, magnesium, iron, and zinc are common in alcoholics. The alcohol itself does not seem to affect how these minerals are absorbed. Rather, they seem to occur secondary to other alcohol-related problems, such as:  Less calcium absorbed.  Not enough magnesium.  More urinary excretion.  Vomiting.  Diarrhea.  Not enough iron due to gastrointestinal bleeding.  Not enough zinc or losses related to other nutrient deficiencies.  Mineral deficiencies can cause a variety of medical consequences. These range from calcium-related bone disease to zinc-related night blindness and skin lesions. ALCOHOL, MALNUTRITION, AND MEDICAL COMPLICATIONS  Liver Disease   Alcoholic liver damage is caused primarily by alcohol itself. But poor nutrition may increase the risk of alcohol-related liver damage. For example, nutrients normally found in the liver are known to be affected by drinking alcohol. These include carotenoids, which are the major  sources of vitamin A, and vitamin E compounds. Decreases in such nutrients may play some role in alcohol-related liver damage. Pancreatitis  Research suggests that malnutrition may increase the risk of developing alcoholic pancreatitis. Research suggests that a diet lacking in protein may increase alcohol's damaging effect on the pancreas. Brain  Nutritional deficiencies may have severe effects on brain function. These may be permanent. Specifically, thiamine deficiencies are often seen in alcoholics. They can cause severe neurological problems. These include:  Impaired movement.  Memory loss seen in Wernicke-Korsakoff syndrome. Pregnancy  Alcohol has toxic effects on fetal development. It causes alcohol-related birth defects. They include fetal alcohol syndrome. Alcohol itself is toxic to the fetus. Also, the nutritional deficiency can affect how the fetus develops. That may compound the risk of developmental damage.  Nutritional needs during pregnancy are 10% to 30% greater than normal. Food intake can increase by as much as 140% to cover the needs of both mother and fetus. An alcoholic mother`s nutritional problems may adversely affect the nutrition of the fetus. And alcohol itself can also restrict nutrition flow to the fetus. NUTRITIONAL STATUS OF ALCOHOLICS  Techniques for assessing nutritional status include:  Taking body measurements to estimate fat reserves. They include:  Weight.  Height.  Mass.  Skin fold thickness.  Performing blood analysis to provide measurements of circulating:  Proteins.  Vitamins.  Minerals.  These techniques tend to be imprecise. For many nutrients, there is no clear "cut-off" point that would allow an accurate definition of deficiency. So assessing the nutritional status of alcoholics is limited by these techniques. Dietary status may provide information about the risk of developing nutritional problems.  Dietary status is assessed by:  Taking  patients' dietary histories.  Evaluating the amount and types of food they are eating.  It is difficult to determine what exact amount of alcohol begins to have damaging effects on nutrition. In general, moderate drinkers have 2 drinks or less per day. They seem to be at little risk for nutritional problems. Various medical disorders begin to appear at greater levels.  Research indicates that the majority of even the heaviest drinkers have few obvious nutritional deficiencies. Many alcoholics who are hospitalized for medical complications of their disease do have severe malnutrition. Alcoholics tend to eat poorly. Often they eat less than the amounts of food necessary to provide enough:  Carbohydrates.  Protein.  Fat.  Vitamins A and C.  B vitamins.  Minerals like calcium and iron. Of major concern is alcohol's effect on digesting food and use of nutrients. It may shift a mildly malnourished person toward severe malnutrition. Document Released: 03/30/2005 Document Revised: 08/28/2011 Document Reviewed: 09/13/2005 [facility] Patient Information 2014 [place].  Alcohol Use Disorder Alcohol use disorder is a mental disorder. It is not a one-time incident of heavy drinking. Alcohol use disorder is the excessive and uncontrollable use of alcohol over time that leads to problems with functioning in one or more areas of daily living. People with this disorder risk harming themselves and others when they drink to excess. Alcohol use disorder also can cause other mental disorders, such as mood and anxiety disorders, and serious physical problems. People with alcohol use disorder often misuse other drugs.  Alcohol use disorder is common and widespread. Some people with this disorder drink alcohol to cope with or escape from negative life events. Others drink to relieve chronic pain or symptoms of mental illness. People with a family history of alcohol use disorder are at higher risk of losing  control and using alcohol to excess.  SYMPTOMS  Signs and symptoms of alcohol use disorder may include the following:   Consumption ofalcohol inlarger amounts or over a longer period of time than intended.  Multiple unsuccessful attempts to cutdown or control alcohol use.   A great deal of time spent obtaining alcohol, using alcohol, or recovering from the effects of alcohol (hangover).  A strong desire or urge to use alcohol (cravings).   Continued use of alcohol despite problems at work, school, or home because of alcohol use.   Continued use of alcohol despite problems in relationships because of alcohol use.  Continued use of alcohol in situations when it is physically hazardous, such as driving a car.  Continued use of alcohol despite awareness of a physical or psychological problem that is likely related to alcohol use. Physical problems related to alcohol use can involve the brain, heart, liver, stomach, and intestines. Psychological problems related to alcohol use include intoxication, depression, anxiety, psychosis, delirium, and dementia.   The need for increased amounts of alcohol to achieve the same desired effect, or a decreased effect from the consumption of the same amount of alcohol (tolerance).  Withdrawal symptoms upon reducing or stopping alcohol use, or alcohol use to reduce or avoid withdrawal symptoms. Withdrawal symptoms include:  Racing heart.  Hand tremor.  Difficulty sleeping.  Nausea.  Vomiting.  Hallucinations.  Restlessness.  Seizures. DIAGNOSIS Alcohol use disorder is diagnosed through an assessment by your caregiver. Your caregiver may start by asking three or four questions to screen for excessive or problematic alcohol use. To confirm a diagnosis of alcohol use disorder, at least two symptoms (  see SYMPTOMS) must be present within a 6770-month period. The severity of alcohol use disorder depends on the number of symptoms:  Mild two or  three.  Moderate four or five.  Severe six or more. Your caregiver may perform a physical exam or use results from lab tests to see if you have physical problems resulting from alcohol use. Your caregiver may refer you to a mental health professional for evaluation. TREATMENT  Some people with alcohol use disorder are able to reduce their alcohol use to low-risk levels. Some people with alcohol use disorder need to quit drinking alcohol. When necessary, mental health professionals with specialized training in substance use treatment can help. Your caregiver can help you decide how severe your alcohol use disorder is and what type of treatment you need. The following forms of treatment are available:   Detoxification. Detoxification involves the use of prescription medication to prevent alcohol withdrawal symptoms in the first week after quitting. This is important for people with a history of symptoms of withdrawal and for heavy drinkers who are likely to have withdrawal symptoms. Alcohol withdrawal can be dangerous and, in severe cases, cause death. Detoxification is usually provided in a hospital or in-patient substance use treatment facility.  Counseling or talk therapy. Talk therapy is provided by substance use treatment counselors. It addresses the reasons people use alcohol and ways to keep them from drinking again. The goals of talk therapy are to help people with alcohol use disorder find healthy activities and ways to cope with life stress, to identify and avoid triggers for alcohol use, and to handle cravings, which can cause relapse.  Medication.Different medications can help treat alcohol use disorder through the following actions:  Decrease alcohol cravings.  Decrease the positive reward response felt from alcohol use.  Produce an uncomfortable physical reaction when alcohol is used (aversion therapy).  Support groups. Support groups are run by people who have quit drinking. They  provide emotional support, advice, and guidance. These forms of treatment are often combined. Some people with alcohol use disorder benefit from intensive combination treatment provided by specialized substance use treatment centers. Both inpatient and outpatient treatment programs are available. Document Released: 07/13/2004 Document Revised: 02/05/2013 Document Reviewed: 09/12/2012 [facility] Patient Information 2014 [place].  Drug Abuse and Addiction in Sports There are many types of drugs that one may become addicted to including illegal drugs (marijuana, cocaine, amphetamines, hallucinogens, and narcotics), prescription drugs (hydrocodone, codeine, and alprazolam), and other chemicals such as alcohol or nicotine. Two types of addiction exist: physical and emotional. Physical addiction usually occurs after prolonged use of a drug. However, some drugs may only take a couple uses before addiction can occur. Physical addiction is marked by withdrawal symptoms, in which the person experiences negative symptoms such as sweat, anxiety, tremors, hallucinations, or cravings in the absence of using the drug. Emotional dependence is the psychological desire for the "high" that the drugs produce when taken. SYMPTOMS   Inattentiveness.  Negligence.  Forgetfulness.  Insomnia.  Mood swings. RISK INCREASES WITH:   Family history of addiction.  Personal history of addictive personality. Studies have shown that risktakers, which many athletes are, have a higher risk of addiction. PREVENTION The only adequate prevention of drug abuse is abstinence from drugs. TREATMENT  The first step in quitting substance abuse is recognizing the problem and realizing that one has the power to change. Quitting requires a plan and support from others. It is often necessary to seek medical assistance. Caregivers are available  to offer counseling, and for certain cases, medicine to diminish the physical symptoms of  withdrawal. Many organizations exist such as Alcoholics Anonymous, Narcotics Anonymous, or the [employer] on Alcoholism that offer support for individuals who have chosen to quit their habits. Document Released: 06/05/2005 Document Revised: 08/28/2011 Document Reviewed: 09/17/2008 [facility] Patient Information 2014 [place], [place].  Chemical Dependency Chemical dependency is an addiction to drugs or alcohol. It is characterized by the repeated behavior of seeking out and using drugs and alcohol despite harmful consequences to the health and safety of ones self and others.  RISK FACTORS There are certain situations or behaviors that increase a person's risk for chemical dependency. These include:  A family history of chemical dependency.  A history of mental health issues, including depression and anxiety.  A home environment where drugs and alcohol are easily available to you.  Drug or alcohol use at a young age. SYMPTOMS  The following symptoms can indicate chemical dependency:  Inability to limit the use of drugs or alcohol.  Nausea, sweating, shakiness, and anxiety that occurs when alcohol or drugs are not being used.  An increase in amount of drugs or alcohol that is necessary to get drunk or high. People who experience these symptoms can assess their use of drugs and alcohol by asking themselves the following questions:  Have you been told by friends or family that they are worried about your use of alcohol or drugs?  Do friends and family ever tell you about things you did while drinking alcohol or using drugs that you do not remember?  Do you lie about using alcohol or drugs or about the amounts you use?  Do you have difficulty completing daily tasks unless you use alcohol or drugs?  Is the level of your work or school performance lower because of your drug or alcohol use?  Do you get sick from using drugs or alcohol but keep using anyway?  Do you feel uncomfortable  in social situations unless you use alcohol or drugs?  Do you use drugs or alcohol to help forget problems? An answer of yes to any of these questions may indicate chemical dependency. Professional evaluation is suggested. Document Released: 05/30/2001 Document Revised: 08/28/2011 Document Reviewed: 08/11/2010 [facility] Patient Information 2014 [place], [place].  Polysubstance Abuse When people abuse more than one drug or type of drug it is called polysubstance or polydrug abuse. For example, many smokers also drink alcohol. This is one form of polydrug abuse. Polydrug abuse also refers to the use of a drug to counteract an unpleasant effect produced by another drug. It may also be used to help with withdrawal from another drug. People who take stimulants may become agitated. Sometimes this agitation is countered with a tranquilizer. This helps protect against the unpleasant side effects. Polydrug abuse also refers to the use of different drugs at the same time.  Anytime drug use is interfering with normal living activities, it has become abuse. This includes problems with family and friends. Psychological dependence has developed when your mind tells you that the drug is needed. This is usually followed by physical dependence which has developed when continuing increases of drug are required to get the same feeling or "high". This is known as addiction or chemical dependency. A person's risk is much higher if there is a history of chemical dependency in the family. SIGNS OF CHEMICAL DEPENDENCY  You have been told by friends or family that drugs have become a problem.  You fight when  using drugs.  You are having blackouts (not remembering what you do while using).  You feel sick from using drugs but continue using.  You lie about use or amounts of drugs (chemicals) used.  You need chemicals to get you going.  You are suffering in work performance or in school because of drug use.  You get  sick from use of drugs but continue to use anyway.  You need drugs to relate to people or feel comfortable in social situations.  You use drugs to forget problems. "Yes" answered to any of the above signs of chemical dependency indicates there are problems. The longer the use of drugs continues, the greater the problems will become. If there is a family history of drug or alcohol use, it is best not to experiment with these drugs. Continual use leads to tolerance. After tolerance develops more of the drug is needed to get the same feeling. This is followed by addiction. With addiction, drugs become the most important part of life. It becomes more important to take drugs than participate in the other usual activities of life. This includes relating to friends and family. Addiction is followed by dependency. Dependency is a condition where drugs are now needed not just to get high, but to feel normal. Addiction cannot be cured but it can be stopped. This often requires outside help and the care of professionals. Treatment centers are listed in the yellow pages under: Cocaine, Narcotics, and Alcoholics Anonymous. Most hospitals and clinics can refer you to a specialized care center. Talk to your caregiver if you need help. Document Released: 01/25/2005 Document Revised: 08/28/2011 Document Reviewed: 06/05/2005 [facility] Patient Information 2014 [place], [place].

## 2013-09-10 NOTE — Progress Notes (Signed)
[facility] provided pt with a [facility] Orange Card application, highlighting [facility], to help patient establish primary care.  °

## 2013-09-10 NOTE — BHH Counselor (Signed)
Writer gave patient resource for [facility] treatment [facility] pt sts she has been to before. Pt is interested in long term SA treatment.    Evette Cristalaroline Paige Zyanna Leisinger, [place] Assessment Counselor

## 2013-09-10 NOTE — ED Notes (Signed)
Page, from [facility] Has spoken with her at length; and she is now eating her breakfast.  She has no requests and is in no distress.  She is oriented x 4 with clear speech.

## 2013-09-10 NOTE — [facility] Counselor (Signed)
[facility].   Evette Cristalaroline Paige Brooks Kinnan, [place] Assessment Counselor

## 2013-09-10 NOTE — ED Provider Notes (Signed)
CSN: [id number]     Arrival date & time 09/09/13  2206 History   First MD Initiated Contact with Patient 09/09/13 2327     Chief Complaint  Patient presents with  . Medical Clearance     (Consider location/radiation/quality/duration/timing/severity/associated sxs/prior Treatment) HPI History provided by pt.   Pt presents w/ request for alcohol and crack cocaine detox.  Has been abusing both on a daily basis since she was 43 years old.  Drinks 3-4 40oz beers per day, most recently just pta.  Has gone through rehab programs in the remote past.  No h/o DTs.  Does not abuse any other substances.  Has h/o PTSD and is non-compliant w/ her medication d/t finances.  Denies SI/HI.  Is currently treated at a methadone clinic.  Past Medical History  Diagnosis Date  . Abscess of mouth    History reviewed. No pertinent past surgical history. History reviewed. No pertinent family history. History  Substance Use Topics  . Smoking status: Current Every Day Smoker    Types: Cigarettes  . Smokeless tobacco: Not on file  . Alcohol Use: No   OB History   Grav Para Term Preterm Abortions TAB SAB Ect Mult Living                 Review of Systems  All other systems reviewed and are negative.      Allergies  Review of patient's allergies indicates no known allergies.  Home Medications   Current Outpatient Rx  Name  Route  Sig  Dispense  Refill  . methadone (DOLOPHINE) 5 MG/5ML solution   Oral   Take 70 mg by mouth daily.          BP 147/82  Pulse 65  Temp(Src) 99.3 F (37.4 C) (Oral)  Resp 18  Ht 5\' 1"  (1.549 m)  Wt 160 lb (72.576 kg)  BMI 30.25 kg/m2  SpO2 99% Physical Exam  Nursing note and vitals reviewed. Constitutional: She is oriented to person, place, and time. She appears well-developed and well-nourished. No distress.  HENT:  Head: Normocephalic and atraumatic.  Mouth/Throat: Oropharynx is clear and moist.  Eyes: Pupils are equal, round, and reactive to light.   Normal appearance  Neck: Normal range of motion.  Cardiovascular: Normal rate and regular rhythm.   Pulmonary/Chest: Effort normal and breath sounds normal. No respiratory distress.  Musculoskeletal: Normal range of motion.  Neurological: She is alert and oriented to person, place, and time.  No tremors  Skin: Skin is warm and dry. No rash noted.  Psychiatric: She has a normal mood and affect. Her behavior is normal.    ED Course  Procedures (including critical care time) Labs Review Labs Reviewed - No data to display Imaging Review No results found.   EKG Interpretation None      MDM   Final diagnoses:  Polysubstance abuse    42yo F presents w/ request for alcohol and crack cocaine detox.  No signs of serious withdrawal currently.  Psych holding and CIWA protocol orders written.  Medical clearance labs are pending.  12:07 AM   No significant lab findings.  Medically clear.      Otilio Miuatherine E Conya Ellinwood, PA-C 09/10/13 [phone]

## 2013-09-10 NOTE — BH Assessment (Signed)
Assessment Note  Alyssa Kelley is an 43 y.o. female.  Pt presents voluntarily for [facility] for detox for cocaine and alcohol. Pt's UDS + for cocaine. Pt sts she drinks approx. three to four 40 oz beers. Last drink was 3/24, approx. three 40 oz beers. Pt sts she smokes approx. $65 crack cocaine daily and last use was 3/24. Pt sts she started using methadone three weeks ago [facility]) and prior to that she injected heroin and used pain pills for years.   Patient denies SI and HI. She denies [facility] and no delusions noted. Pt sts she has [facility] to [facility]. Longest amount of clean time is 1 year but she was able to get 6 mos on a few occasions. Pt describes mood as "up and down". She endorses fatigue, tearfulness, loss of appetite, isolating behavior and loss of interest in usual pleasures. Pt sts she has PTSD. She reports her father was shot and killed and then her mom died from cirrhosis 6 mos later when pt was 51. Pt reports severe anxiety. Pt has no hx of seizures and has never been in [employer]. Pt's affect is depressed and she is cooperative. Pt sts she takes Paxil, Vistaril and Indoral for tremors. Pt reports withdrawal symptoms of tremors, nausea and chills.    Axis I:  Alcohol Use Disorder, Severe             Cocaine Use Disorder, Severe             Opioid Use Disorder, on Maintenance Therapy            PTSD            Substance Induced Mood Disorder  Axis II: Deferred Axis III:  Past Medical History  Diagnosis Date  . Abscess of mouth    Axis IV: economic problems, occupational problems, other psychosocial or environmental problems and problems related to social environment Axis V: 41-50 serious symptoms  Past Medical History:  Past Medical History  Diagnosis Date  . Abscess of mouth     History reviewed. No pertinent past surgical history.  Family History: History reviewed. No pertinent family history.  Social History:  reports that she has been  smoking Cigarettes.  She has been smoking about 0.00 packs per day. She does not have any smokeless tobacco history on file. She reports that she drinks alcohol. She reports that she uses illicit drugs (Cocaine, Oxycodone, and Heroin).  Additional Social History:  Alcohol / Drug Use Pain Medications: see PTA meds list - pt sts hx of abusing oxycodone and dilaudid Prescriptions: see PTA meds list - pt denies abuse Over the Counter: see PTA meds list - pt denies abuse History of alcohol / drug use?: Yes Longest period of sobriety (when/how long): 1 yr Negative Consequences of Use: Personal relationships;Financial Withdrawal Symptoms: Nausea / Vomiting;Fever / Chills;Tremors Substance #1 Name of Substance 1: alcohol 1 - Age of First Use: 16 1 - Amount (size/oz): three to four 40 oz beers 1 - Frequency: daily 1 - Duration: since age 34 1 - Last Use / Amount: 09/09/13 - three 40 oz beers Substance #2 Name of Substance 2: crack cocaine 2 - Age of First Use: 16 2 - Amount (size/oz): $65 2 - Frequency: daily 2 - Duration: since age 24 2 - Last Use / Amount: 09/09/13 -  Substance #3 Name of Substance 3: opiates - injected heroin, used pain pills (dilaudid, oxycodone) 3 -  Age of First Use: 18 3 - Amount (size/oz): 1 g of heroin daily or pain pills daily 3 - Frequency: daily 3 - Duration: on and off since age 22 3 - Last Use / Amount: 3 weeks ago [facility]  CIWA: CIWA-Ar BP: 125/71 mmHg Pulse Rate: 62 Nausea and Vomiting: mild nausea with no vomiting Tactile Disturbances: very mild itching, pins and needles, burning or numbness Tremor: not visible, but can be felt fingertip to fingertip Auditory Disturbances: not present Paroxysmal Sweats: no sweat visible Visual Disturbances: very mild sensitivity Anxiety: two Headache, Fullness in Head: none present Agitation: somewhat more than normal activity Orientation and Clouding of Sensorium: oriented and can  do serial additions CIWA-Ar Total: 7 COWS:    Allergies: No Known Allergies  Home Medications:  (Not in a hospital admission)  OB/GYN Status:  No LMP recorded.  General Assessment Data [facility] of [facility] Is this a Tele or Face-to-Face Assessment?: Face-to-Face Is this an Initial Assessment or a Re-assessment for this encounter?: Initial Assessment Living Arrangements: Other relatives (aunt, pt's kids ages 42 & 36) Can pt return to current living arrangement?: Yes Admission Status: Voluntary Is patient capable of signing voluntary admission?: Yes Transfer from: Home Referral Source: Self/Family/Friend     [facility] Crisis Care Plan Living Arrangements: Other relatives (aunt, pt's kids ages 40 & 81)  Education Status Is patient currently in school?: No Highest grade of school patient has completed: 43 Name of school: [employer] in [place]  Risk to self Suicidal Ideation: No Suicidal Intent: No Is patient at risk for suicide?: No Suicidal Plan?: No Access to Means: No What has been your use of drugs/alcohol within the last 12 months?: daily alcohol and crack use Previous Attempts/Gestures: No How many times?: 0 Other Self Harm Risks: none Triggers for Past Attempts:  (n/a) Intentional Self Injurious Behavior: None Family Suicide History: No Recent stressful life event(s): Other (Comment);Financial Problems (substance abuse, cant find job) Persecutory voices/beliefs?: No Depression: Yes Depression Symptoms: Tearfulness;Isolating;Loss of interest in usual pleasures;Fatigue (loss of appetite) Substance abuse history and/or treatment for substance abuse?: Yes Suicide prevention information given to non-admitted patients: Not applicable  Risk to Others Homicidal Ideation: No Thoughts of Harm to Others: No Current Homicidal Intent: No Current Homicidal Plan: No Access to Homicidal Means: No Identified Victim: none History of harm to others?: No Assessment of  Violence: None Noted Violent Behavior Description: none Does patient have access to weapons?: No Criminal Charges Pending?: No Does patient have a court date: No  Psychosis Hallucinations: None noted Delusions: None noted  Mental Status Report Appear/Hygiene: Disheveled Eye Contact: Good Motor Activity: Freedom of movement Speech: Logical/coherent;Soft Level of Consciousness: Quiet/awake;Alert Mood: Labile;Depressed;Anhedonia;Sad;Anxious Affect: Sad;Depressed Anxiety Level: Severe Thought Processes: Relevant;Coherent Judgement: Unimpaired Orientation: Person;Time;Situation;Place Obsessive Compulsive Thoughts/Behaviors: None  Cognitive Functioning Concentration: Normal Memory: Recent Intact;Remote Intact IQ: Average Insight: Fair Impulse Control: Poor Appetite: Poor Sleep: No Change Total Hours of Sleep: 6 Vegetative Symptoms: None  ADLScreening [facility] Assessment Services) Patient's cognitive ability adequate to safely complete daily activities?: Yes Patient able to express need for assistance with ADLs?: Yes Independently performs ADLs?: Yes (appropriate for developmental age)  Prior Inpatient Therapy Prior Inpatient Therapy: Yes Prior Therapy Dates: over sevearl years Prior Therapy [facility]ilty/Provider([facility] Reason for Treatment: substance abuse  Prior Outpatient Therapy Prior Outpatient Therapy: No Prior Therapy Dates: na Prior Therapy Facilty/Provider(s): na Reason for Treatment: na  ADL Screening (condition at time  of admission) Patient's cognitive ability adequate to safely complete daily activities?: Yes Is the patient deaf or have difficulty hearing?: No Does the patient have difficulty seeing, even when wearing glasses/contacts?: No Does the patient have difficulty concentrating, remembering, or making decisions?: No Patient able to express need for assistance with ADLs?: Yes Does the patient have difficulty dressing or bathing?:  No Independently performs ADLs?: Yes (appropriate for developmental age) Does the patient have difficulty walking or climbing stairs?: No Weakness of Legs: None Weakness of Arms/Hands: None  Home Assistive Devices/Equipment Home Assistive Devices/Equipment: None    Abuse/Neglect Assessment (Assessment to be complete while patient is alone) Physical Abuse: Denies Verbal Abuse: Denies Sexual Abuse: Denies Exploitation of patient/patient's resources: Denies Self-Neglect: Denies Values / Beliefs Cultural Requests During Hospitalization: None Spiritual Requests During Hospitalization: None   Advance Directives (For Healthcare) Advance Directive: Patient does not have advance directive;Patient would not like information    Additional Information 1:1 In Past 12 Months?: No CIRT Risk: No Elopement Risk: No Does patient have medical clearance?: Yes     Disposition:  Disposition Initial Assessment Completed for this Encounter: Yes Disposition of Patient: Inpatient treatment program Type of inpatient treatment program: Adult  On Site Evaluation by:   Reviewed with Physician:    Donnamarie RossettiMCLEAN, Ameen Mostafa P 09/10/2013 9:08 AM

## 2013-09-10 NOTE — ED Notes (Signed)
I had seen her at 0730, at which time she was sound asleep with her skin being normal, warm and dry, and she was breathing normally.  She continues to sleep, which we allow her to do.  We anticipate breakfast delivery shortly, which I have confirmed has been ordered.

## 2013-09-10 NOTE — ED Provider Notes (Signed)
Medical screening examination/treatment/procedure(s) were performed by non-physician practitioner and as supervising physician I was immediately available for consultation/collaboration.   EKG Interpretation None       Bonny Vanleeuwen M Case Vassell, MD 09/10/13 0836 

## 2013-09-10 NOTE — Consult Note (Addendum)
[facility] Consult   Reason for Consult:  Heroin detox Referring Physician:  EDP  Alyssa Kelley is an 43 y.o. female. Total Time spent with patient: 45 minutes  Assessment: AXIS I:  Alcohol Abuse and Substance Abuse AXIS II:  Deferred AXIS III:   Past Medical History  Diagnosis Date  . Abscess of mouth    AXIS IV:  other psychosocial or environmental problems and problems related to social environment AXIS V:  61-70 mild symptoms  Plan:  No evidence of imminent risk to self or others at present.   Patient does not meet criteria for psychiatric inpatient admission. Supportive therapy provided about ongoing stressors. Discussed crisis plan, support from social network, calling 911, coming to the Emergency Department, and calling Suicide Hotline.  Subjective:   Alyssa Kelley is a 43 y.o. female patient.  HPI:  Patient states that she want to detox and go to rehab.  Patient states that she uses alcohol, cocaine, and heroin daily. Patient states that she was in rehab 1 year ago ([facility] for 14 days) and was clean for 6 months.  "I am currently in a methadone clinic.   Patient denies suicidal/homicidal ideation, psychosis, and paranoia.  No mental health history other than substance abuse and detox/rehab on multiple occassions.  HPI Elements:   Location:  Polysubstance abuse. Quality:  wanting detox for polysubstances. Severity:  Polysubstance abuse. Timing:  Detox/rehab 1 year ago and relapsed as soon as she was discharged.  Review of Systems  Constitutional: Negative for chills and diaphoresis.  Gastrointestinal: Negative for nausea, vomiting, abdominal pain and diarrhea.  Musculoskeletal: Negative.   Neurological: Negative for seizures and headaches.  Psychiatric/Behavioral: Positive for substance abuse. Negative for depression, suicidal ideas, hallucinations and memory loss. The patient is not nervous/anxious and does not have insomnia.     Denies family  history of substance abuse Past Psychiatric History: Past Medical History  Diagnosis Date  . Abscess of mouth     reports that she has been smoking Cigarettes.  She has been smoking about 0.00 packs per day. She does not have any smokeless tobacco history on file. She reports that she drinks alcohol. She reports that she uses illicit drugs (Cocaine, Oxycodone, and Heroin). History reviewed. No pertinent family history. Family History Substance Abuse: No Family Supports: Yes, List: Living Arrangements: Other relatives (aunt, pt's kids ages 78 & 69) Can pt return to current living arrangement?: Yes Abuse/Neglect [facility]) Physical Abuse: Denies Verbal Abuse: Denies Sexual Abuse: Denies Allergies:  No Known Allergies  ACT Assessment Complete:  No:   Past Psychiatric History: Diagnosis:  Polysubstance abuse and dependence  Hospitalizations:  Multiple for detox and rehab  Outpatient Care:  Methadone clinic  Substance Abuse Care:  Heroin, Cocaine, Alcohol  Self-Mutilation:  Denies  Suicidal Attempts:  Denies  Homicidal Behaviors:  Denies   Violent Behaviors:  Denies   Place of Residence:  [place] Marital Status:  Single Employed/Unemployed:  Unemployed Education:   Family Supports:  Yes Objective: Blood pressure 141/81, pulse 63, temperature 98.2 F (36.8 C), temperature source Oral, resp. rate 16, height $RemoveBe'5\' 1"'qBlceeUmD$  (1.549 m), weight 72.576 kg (160 lb), SpO2 99.00%.Body mass index is 30.25 kg/(m^2). Results for orders placed during the hospital encounter of 09/09/13 (from the past 72 hour(s))  CBC WITH DIFFERENTIAL     Status: None   Collection Time    09/10/13 12:17 AM      Result Value Ref Range   WBC 9.4  4.0 -  10.5 K/uL   RBC 4.25  3.87 - 5.11 MIL/uL   Hemoglobin 12.5  12.0 - 15.0 g/dL   HCT 38.4  36.0 - 46.0 %   MCV 90.4  78.0 - 100.0 fL   MCH 29.4  26.0 - 34.0 pg   MCHC 32.6  30.0 - 36.0 g/dL   RDW 13.5  11.5 - 15.5 %   Platelets 264  150 - 400 K/uL   Neutrophils Relative  % 70  43 - 77 %   Neutro Abs 6.6  1.7 - 7.7 K/uL   Lymphocytes Relative 22  12 - 46 %   Lymphs Abs 2.1  0.7 - 4.0 K/uL   Monocytes Relative 8  3 - 12 %   Monocytes Absolute 0.8  0.1 - 1.0 K/uL   Eosinophils Relative 0  0 - 5 %   Eosinophils Absolute 0.0  0.0 - 0.7 K/uL   Basophils Relative 0  0 - 1 %   Basophils Absolute 0.0  0.0 - 0.1 K/uL  COMPREHENSIVE METABOLIC PANEL     Status: Abnormal   Collection Time    09/10/13 12:17 AM      Result Value Ref Range   Sodium 136 (*) 137 - 147 mEq/L   Potassium 3.5 (*) 3.7 - 5.3 mEq/L   Chloride 95 (*) 96 - 112 mEq/L   CO2 28  19 - 32 mEq/L   Glucose, Bld 117 (*) 70 - 99 mg/dL   BUN 9  6 - 23 mg/dL   Creatinine, Ser 0.67  0.50 - 1.10 mg/dL   Calcium 9.6  8.4 - 10.5 mg/dL   Total Protein 8.4 (*) 6.0 - 8.3 g/dL   Albumin 3.2 (*) 3.5 - 5.2 g/dL   AST 53 (*) 0 - 37 U/L   ALT 46 (*) 0 - 35 U/L   Alkaline Phosphatase 99  39 - 117 U/L   Total Bilirubin 0.3  0.3 - 1.2 mg/dL   GFR calc non Af Amer >90  >90 mL/min   GFR calc Af Amer >90  >90 mL/min   Comment: (NOTE)     The eGFR has been calculated using the CKD EPI equation.     This calculation has not been validated in all clinical situations.     eGFR's persistently <90 mL/min signify possible Chronic Kidney     Disease.  ETHANOL     Status: None   Collection Time    09/10/13 12:17 AM      Result Value Ref Range   Alcohol, Ethyl (B) <11  0 - 11 mg/dL   Comment:            LOWEST DETECTABLE LIMIT FOR     SERUM ALCOHOL IS 11 mg/dL     FOR MEDICAL PURPOSES ONLY  URINE RAPID DRUG SCREEN (HOSP PERFORMED)     Status: Abnormal   Collection Time    09/10/13 12:35 AM      Result Value Ref Range   Opiates NONE DETECTED  NONE DETECTED   Cocaine POSITIVE (*) NONE DETECTED   Benzodiazepines NONE DETECTED  NONE DETECTED   Amphetamines NONE DETECTED  NONE DETECTED   Tetrahydrocannabinol NONE DETECTED  NONE DETECTED   Barbiturates NONE DETECTED  NONE DETECTED   Comment:            DRUG SCREEN  FOR MEDICAL PURPOSES     ONLY.  IF CONFIRMATION IS NEEDED     FOR ANY PURPOSE, NOTIFY LAB  WITHIN 5 DAYS.                LOWEST DETECTABLE LIMITS     FOR URINE DRUG SCREEN     Drug Class       Cutoff (ng/mL)     Amphetamine      1000     Barbiturate      200     Benzodiazepine   591     Tricyclics       638     Opiates          300     Cocaine          300     THC              50   Labs are reviewed and are pertinent for UDS positive for Cocaine and ETOH  < 11.  Home medications reviewed no changes.  Current Facility-Administered Medications  Medication Dose Route Frequency Provider Last Rate Last Dose  . alum & mag hydroxide-simeth (MAALOX/MYLANTA) 200-200-20 MG/5ML suspension 30 mL  30 mL Oral PRN Arville Lime Schinlever, PA-C      . ibuprofen (ADVIL,MOTRIN) tablet 600 mg  600 mg Oral Q8H PRN Remer Macho, PA-C      . LORazepam (ATIVAN) tablet 0-4 mg  0-4 mg Oral 4 times per day Remer Macho, PA-C   1 mg at 09/10/13 0444   Followed by  . [START ON 09/12/2013] LORazepam (ATIVAN) tablet 0-4 mg  0-4 mg Oral Q12H Catherine E Schinlever, PA-C      . nicotine (NICODERM CQ - dosed in mg/24 hours) patch 21 mg  21 mg Transdermal Daily Arville Lime Schinlever, PA-C   21 mg at 09/10/13 1011  . ondansetron (ZOFRAN) tablet 4 mg  4 mg Oral Q8H PRN Arville Lime Schinlever, PA-C      . thiamine (VITAMIN B-1) tablet 100 mg  100 mg Oral Daily Arville Lime Schinlever, PA-C   100 mg at 09/10/13 1011   Or  . thiamine (B-1) injection 100 mg  100 mg Intravenous Daily Remer Macho, PA-C       Current Outpatient Prescriptions  Medication Sig Dispense Refill  . methadone (DOLOPHINE) 5 MG/5ML solution Take 70 mg by mouth daily.        Psychiatric Specialty Exam:     Blood pressure 141/81, pulse 63, temperature 98.2 F (36.8 C), temperature source Oral, resp. rate 16, height _0  (1.549 m), weight 72.576 kg (160 lb), SpO2 99.00%.Body mass index is 30.25 kg/(m^2).  General  Appearance: Casual  Eye Contact::  Good  Speech:  Clear and Coherent and Normal Rate  Volume:  Normal  Mood:  "Good Mood"  Affect:  Congruent  Thought Process:  Circumstantial  Orientation:  Full (Time, Place, and Person)  Thought Content:  "I want long term with methadone"  Suicidal Thoughts:  No  Homicidal Thoughts:  No  Memory:  Immediate;   Good Recent;   Good Remote;   Good  Judgement:  Poor  Insight:  Lacking  Psychomotor Activity:  Normal  Concentration:  Fair  Recall:  Good  Fund of Knowledge:Good  Language: Good  Akathisia:  No  Handed:  Right  AIMS (if indicated):     Assets:  Communication Skills Desire for Improvement Housing  Sleep:      Musculoskeletal: Strength & Muscle Tone: within normal limits Gait & Station: normal Patient leans: N/A  Treatment Plan Summary: Outpatient and rehab resources  Consulted with SW and no rehab beds found.  Disposition:  Discharge home recommended.  Patient to be given resources for outpatient substance abuse and rehab resources to follow up with.    Discharge Assessment     Demographic Factors:  Female  Total Time spent with patient: 15 minutes  Psychiatric Specialty Exam: Same as above  Musculoskeletal: Same as above  Mental Status Per Nursing Assessment::   On Admission:     Current Mental Status by Physician: Patient denies suicidal/homicidal ideation, psychosis, and paranoia  Loss Factors: NA  Historical Factors: NA  Risk Reduction Factors:   Positive social support  Continued Clinical Symptoms:  Alcohol/Substance Abuse/Dependencies  Cognitive Features That Contribute To Risk:  None    Suicide Risk:  Minimal: No identifiable suicidal ideation.  Patients presenting with no risk factors but with morbid ruminations; may be classified as minimal risk based on the severity of the depressive symptoms  Discharge Diagnoses:  Same as above  Plan Of Care/Follow-up recommendations:  Activity:   Resume usual activity Diet:  Resume usual diet  Is patient on multiple antipsychotic therapies at discharge:  No   Has Patient had three or more failed trials of antipsychotic monotherapy by history:  No  Recommended Plan for Multiple Antipsychotic Therapies: NA  Rankin, Shuvon, FNP-BC 09/10/2013 11:01 AM  Face to face evaluation and I agree with this note

## 2013-09-10 NOTE — ED Notes (Signed)
The psychiatrist and his PA are speaking with pt. As I write this.

## 2013-11-24 ENCOUNTER — Encounter (HOSPITAL_COMMUNITY): Payer: Self-pay | Admitting: Emergency Medicine

## 2013-11-24 ENCOUNTER — Emergency Department (HOSPITAL_COMMUNITY)
Admission: EM | Admit: 2013-11-24 | Discharge: 2013-11-24 | Disposition: A | Discharge status: Home / Self Care | Payer: Self-pay | Attending: Emergency Medicine | Admitting: Emergency Medicine

## 2013-11-24 ENCOUNTER — Emergency Department (HOSPITAL_COMMUNITY): Payer: Self-pay

## 2013-11-24 DIAGNOSIS — B9689 Other specified bacterial agents as the cause of diseases classified elsewhere: Secondary | ICD-10-CM | POA: Insufficient documentation

## 2013-11-24 DIAGNOSIS — N760 Acute vaginitis: Secondary | ICD-10-CM | POA: Insufficient documentation

## 2013-11-24 DIAGNOSIS — N393 Stress incontinence (female) (male): Secondary | ICD-10-CM | POA: Insufficient documentation

## 2013-11-24 DIAGNOSIS — R109 Unspecified abdominal pain: Secondary | ICD-10-CM

## 2013-11-24 DIAGNOSIS — Z8719 Personal history of other diseases of the digestive system: Secondary | ICD-10-CM | POA: Insufficient documentation

## 2013-11-24 DIAGNOSIS — R740 Nonspecific elevation of levels of transaminase and lactic acid dehydrogenase [LDH]: Secondary | ICD-10-CM

## 2013-11-24 DIAGNOSIS — R7401 Elevation of levels of liver transaminase levels: Secondary | ICD-10-CM | POA: Insufficient documentation

## 2013-11-24 DIAGNOSIS — R7402 Elevation of levels of lactic acid dehydrogenase (LDH): Secondary | ICD-10-CM | POA: Insufficient documentation

## 2013-11-24 DIAGNOSIS — F17200 Nicotine dependence, unspecified, uncomplicated: Secondary | ICD-10-CM | POA: Insufficient documentation

## 2013-11-24 DIAGNOSIS — A499 Bacterial infection, unspecified: Secondary | ICD-10-CM | POA: Insufficient documentation

## 2013-11-24 LAB — URINALYSIS, ROUTINE W REFLEX MICROSCOPIC
BILIRUBIN URINE: NEGATIVE
Glucose, UA: NEGATIVE mg/dL
Hgb urine dipstick: NEGATIVE
Ketones, ur: NEGATIVE mg/dL
Leukocytes, UA: NEGATIVE
Nitrite: NEGATIVE
PH: 6.5 (ref 5.0–8.0)
Protein, ur: NEGATIVE mg/dL
Specific Gravity, Urine: 1.012 (ref 1.005–1.030)
UROBILINOGEN UA: 1 mg/dL (ref 0.0–1.0)

## 2013-11-24 LAB — COMPREHENSIVE METABOLIC PANEL
ALT: 72 U/L — AB (ref 0–35)
AST: 90 U/L — AB (ref 0–37)
Albumin: 3.3 g/dL — ABNORMAL LOW (ref 3.5–5.2)
Alkaline Phosphatase: 71 U/L (ref 39–117)
BUN: 12 mg/dL (ref 6–23)
CALCIUM: 9.4 mg/dL (ref 8.4–10.5)
CHLORIDE: 100 meq/L (ref 96–112)
CO2: 22 meq/L (ref 19–32)
Creatinine, Ser: 0.69 mg/dL (ref 0.50–1.10)
GFR calc Af Amer: >90 mL/min (ref >90)
Glucose, Bld: 108 mg/dL — ABNORMAL HIGH (ref 70–99)
Potassium: 4.3 mEq/L (ref 3.7–5.3)
SODIUM: 136 meq/L — AB (ref 137–147)
Total Bilirubin: 0.3 mg/dL (ref 0.3–1.2)
Total Protein: 7.8 g/dL (ref 6.0–8.3)

## 2013-11-24 LAB — CBC WITH DIFFERENTIAL/PLATELET
BASOS ABS: 0 10*3/uL (ref 0.0–0.1)
BASOS PCT: 0 % (ref 0–1)
EOS PCT: 0 % (ref 0–5)
Eosinophils Absolute: 0 10*3/uL (ref 0.0–0.7)
HCT: 39.9 % (ref 36.0–46.0)
Hemoglobin: 13.2 g/dL (ref 12.0–15.0)
LYMPHS PCT: 28 % (ref 12–46)
Lymphs Abs: 1.9 10*3/uL (ref 0.7–4.0)
MCH: 29.7 pg (ref 26.0–34.0)
MCHC: 33.1 g/dL (ref 30.0–36.0)
MCV: 89.7 fL (ref 78.0–100.0)
Monocytes Absolute: 0.8 10*3/uL (ref 0.1–1.0)
Monocytes Relative: 12 % (ref 3–12)
NEUTROS ABS: 3.9 10*3/uL (ref 1.7–7.7)
Neutrophils Relative %: 60 % (ref 43–77)
Platelets: 260 10*3/uL (ref 150–400)
RBC: 4.45 MIL/uL (ref 3.87–5.11)
RDW: 14.3 % (ref 11.5–15.5)
WBC: 6.7 10*3/uL (ref 4.0–10.5)

## 2013-11-24 LAB — WET PREP, GENITAL
TRICH WET PREP: NONE SEEN
YEAST WET PREP: NONE SEEN

## 2013-11-24 LAB — LIPASE, BLOOD: Lipase: 25 U/L (ref 11–59)

## 2013-11-24 MED ORDER — HYDROMORPHONE HCL PF 1 MG/ML IJ SOLN
0.5000 mg | INTRAMUSCULAR | Status: AC
  Administered 2013-11-24: 0.5 mg via INTRAVENOUS
  Filled 2013-11-24: qty 1

## 2013-11-24 MED ORDER — SODIUM CHLORIDE 0.9 % IV BOLUS (SEPSIS)
1000.0000 mL | Freq: Once | INTRAVENOUS | Status: AC
  Administered 2013-11-24: 1000 mL via INTRAVENOUS

## 2013-11-24 MED ORDER — ONDANSETRON HCL 4 MG/2ML IJ SOLN
4.0000 mg | Freq: Once | INTRAMUSCULAR | Status: AC
  Administered 2013-11-24: 4 mg via INTRAVENOUS
  Filled 2013-11-24: qty 2

## 2013-11-24 MED ORDER — DIPHENHYDRAMINE HCL 50 MG/ML IJ SOLN
12.5000 mg | Freq: Once | INTRAMUSCULAR | Status: AC
  Administered 2013-11-24: 12.5 mg via INTRAVENOUS
  Filled 2013-11-24: qty 1

## 2013-11-24 MED ORDER — IOHEXOL 300 MG/ML  SOLN
100.0000 mL | Freq: Once | INTRAMUSCULAR | Status: AC | PRN
Start: 2013-11-24 — End: 2013-11-24
  Administered 2013-11-24: 100 mL via INTRAVENOUS

## 2013-11-24 MED ORDER — MORPHINE SULFATE 4 MG/ML IJ SOLN
4.0000 mg | INTRAMUSCULAR | Status: DC | PRN
  Administered 2013-11-24: 4 mg via INTRAVENOUS
  Filled 2013-11-24 (×2): qty 1

## 2013-11-24 MED ORDER — METRONIDAZOLE 500 MG PO TABS
500.0000 mg | ORAL_TABLET | Freq: Once | ORAL | Status: AC
  Administered 2013-11-24: 500 mg via ORAL
  Filled 2013-11-24: qty 1

## 2013-11-24 MED ORDER — METRONIDAZOLE 500 MG PO TABS: 500.0000 mg | ORAL_TABLET | Freq: Two times a day (BID) | ORAL | Status: DC

## 2013-11-24 MED ORDER — IOHEXOL 300 MG/ML  SOLN
50.0000 mL | Freq: Once | INTRAMUSCULAR | Status: AC | PRN
  Administered 2013-11-24: 50 mL via ORAL

## 2013-11-24 NOTE — Progress Notes (Signed)
  CARE MANAGEMENT ED NOTE 11/24/2013  Patient:  ZANARI, EISENSTEIN   Account Number:  [id number]  Date Initiated:  11/24/2013  Documentation initiated by:  Radford Pax  Subjective/Objective Assessment:   Patient presents to Ed with abdominal pain.     Subjective/Objective Assessment Detail:     Action/Plan:   Action/Plan Detail:   Anticipated DC Date:  11/24/2013     Status Recommendation to Physician:   Result of Recommendation:    Other ED Services  Consult Working Plan    DC Planning Services  Other  PCP issues  Medication Assistance    Choice offered to / List presented to:            Status of service:  Completed, signed off  ED Comments:   ED Comments Detail:  EDCM spoke to patient at bedside.  Patient without pcp or insurance.  [facility] has provided patient with resources for pcp and orange card.  Patient to be discharged on Flagyl 14 tablets.  [facility] provided patient discount coupon for [facility] pharmacy and also provide patient with RX discount card.  Patient thankful for resources.  No further EDCM needs at this time.

## 2013-11-24 NOTE — ED Notes (Signed)
Pt returned from CT °

## 2013-11-24 NOTE — ED Provider Notes (Signed)
CSN: [id number]     Arrival date & time 11/24/13  1116 History   First MD Initiated Contact with Patient 11/24/13 1219     Chief Complaint  Patient presents with  . Abdominal Pain     (Consider location/radiation/quality/duration/timing/severity/associated sxs/prior Treatment) HPI  Alyssa Kelley is a 43 y.o. female complaining of severe right lower quadrant pain onset last night. Pain is getting progressively worse, she has no appetite. Denies fever, chills, change in bowel or bladder habits, abnormal vaginal discharge. She is history of 2 prior C-sections, still has both appendix and gallbladder. Patient reports stress incontinence worsening over the last month. He has no primary care OB/GYN because she does have insurance.  Past Medical History  Diagnosis Date  . Abscess of mouth    History reviewed. No pertinent past surgical history. No family history on file. History  Substance Use Topics  . Smoking status: Current Every Day Smoker    Types: Cigarettes  . Smokeless tobacco: Not on file  . Alcohol Use: Yes   OB History   Grav Para Term Preterm Abortions TAB SAB Ect Mult Living                 Review of Systems  10 systems reviewed and found to be negative, except as noted in the HPI.  Allergies  Review of patient's allergies indicates no known allergies.  Home Medications   Prior to Admission medications   Medication Sig Start Date End Date Taking? Authorizing Provider  metroNIDAZOLE (FLAGYL) 500 MG tablet Take 1 tablet (500 mg total) by mouth 2 (two) times daily. One po bid x 7 days 11/24/13   Joni Reining Lynnell Fiumara, PA-C   BP 119/81  Pulse 80  Temp(Src) 98.5 F (36.9 C) (Oral)  Resp 20  SpO2 99%  LMP 11/10/2013 Physical Exam  Nursing note and vitals reviewed. Constitutional: She is oriented to person, place, and time. She appears well-developed and well-nourished. No distress.  HENT:  Head: Normocephalic.  Mouth/Throat: Oropharynx is clear and moist.  Eyes:  Conjunctivae and EOM are normal. Pupils are equal, round, and reactive to light.  Neck: Normal range of motion. Neck supple.  Cardiovascular: Normal rate, regular rhythm and intact distal pulses.   Pulmonary/Chest: Effort normal and breath sounds normal. No stridor. No respiratory distress. She has no wheezes. She has no rales. She exhibits no tenderness.  Abdominal: Soft. Bowel sounds are normal. She exhibits no distension and no mass. There is tenderness. There is no rebound and no guarding.  Bowel sounds normal. Tender to the right lower quadrant. Rovsing, Psoas and obturator are negative.  Genitourinary:  Pelvic exam chaperoned by nurse:  No rashes or lesions, skin tag which patient says is chronic on the be a majora on the right side.  There is a thin, yellow, profuse, foul-smelling vaginal discharge. There is no cervical motion or adnexal tenderness  Musculoskeletal: Normal range of motion.  Neurological: She is alert and oriented to person, place, and time.  Psychiatric: She has a normal mood and affect.    ED Course  Procedures (including critical care time) Labs Review Labs Reviewed  WET PREP, GENITAL - Abnormal; Notable for the following:    Clue Cells Wet Prep HPF POC MANY (*)    WBC, Wet Prep HPF POC FEW (*)    All other components within normal limits  COMPREHENSIVE METABOLIC PANEL - Abnormal; Notable for the following:    Sodium 136 (*)    Glucose, Bld 108 (*)  Albumin 3.3 (*)    AST 90 (*)    ALT 72 (*)    All other components within normal limits  GC/CHLAMYDIA PROBE AMP  CBC WITH DIFFERENTIAL  URINALYSIS, ROUTINE W REFLEX MICROSCOPIC  LIPASE, BLOOD    Imaging Review Ct Abdomen Pelvis W Contrast  11/24/2013   CLINICAL DATA:  43 year old female with right lower quadrant pain. Initial encounter.  EXAM: CT ABDOMEN AND PELVIS WITH CONTRAST  TECHNIQUE: Multidetector CT imaging of the abdomen and pelvis was performed using the standard protocol following bolus  administration of intravenous contrast.  CONTRAST:  63mL OMNIPAQUE IOHEXOL 300 MG/ML SOLN, OMNIPAQUE IOHEXOL 300 MG/ML SOLN  COMPARISON:  Abdominal ultrasound 11/18/2004.  FINDINGS: Mild dependent atelectasis.  No pericardial or pleural effusion.  No acute osseous abnormality identified.  No pelvic free fluid.  Negative rectum.  Negative uterus and adnexa.  Redundant sigmoid colon, otherwise negative. Stool throughout the colon which is nondilated. Other colon segments including the cecum and appendix are normal. Oral contrast in small bowel loops but has not yet reached the distal small bowel. No dilated or inflamed small bowel. Negative stomach and duodenum.  Mild fatty infiltration of the liver along the falciform ligament. The liver otherwise appears normal. Negative gallbladder, spleen, pancreas, adrenal glands, and portal venous system. Major arterial structures in the abdomen and pelvis are patent. No abdominal free fluid.  Mild right renal midpole scarring. Otherwise negative kidneys. No nephrolithiasis or perinephric stranding. Normal renal contrast excretion on delayed images. No lymphadenopathy identified. Unremarkable bladder.  IMPRESSION: Normal appendix and no acute or inflammatory findings identified in the abdomen or pelvis.   Electronically Signed   By: Augusto Gamble M.D.   On: 11/24/2013 15:03     EKG Interpretation None      MDM   Final diagnoses:  Abdominal pain  Bacterial vaginosis  Stress incontinence in female  Transaminitis    Filed Vitals:   11/24/13 1125 11/24/13 1420  BP: 119/81   Pulse: 76 80  Temp: 98.5 F (36.9 C)   TempSrc: Oral   Resp: 20   SpO2: 95% 99%    Medications  morphine 4 MG/ML injection 4 mg (4 mg Intravenous Given 11/24/13 1335)  metroNIDAZOLE (FLAGYL) tablet 500 mg (not administered)  sodium chloride 0.9 % bolus 1,000 mL (1,000 mLs Intravenous New Bag/Given 11/24/13 1348)  iohexol (OMNIPAQUE) 300 MG/ML solution 50 mL (50 mLs Oral Contrast  Given 11/24/13 1257)  diphenhydrAMINE (BENADRYL) injection 12.5 mg (12.5 mg Intravenous Given 11/24/13 1401)  ondansetron (ZOFRAN) injection 4 mg (4 mg Intravenous Given 11/24/13 1402)  HYDROmorphone (DILAUDID) injection 0.5 mg (0.5 mg Intravenous Given 11/24/13 1404)  iohexol (OMNIPAQUE) 300 MG/ML solution 100 mL (100 mLs Intravenous Contrast Given 11/24/13 1426)    Alyssa Kelley is a 43 y.o. female presenting with worsening right lower quadrant pain associated with decreased by mouth intake, patient is afebrile, no white count, serial abdominal exams remained benign. CT to rule out appendicitis, pelvic exam with no cervical motion or adnexal tenderness in foul-smelling profuse vaginal discharge: BV versus trich.  Patient had hive-like reaction when morphine infused. Pt didn't Benadryl and switch to Dilaudid. No further issues.   CAT scan with no acute abnormalities. Serial abdominal exams remained benign. Wet prep with clue cells. Nonspecific elevation in LFTs. Advised recheck with primary care in 2 weeks. Will treat for bacterial vaginosis. Because of patient's history of opiate abuse I've recommended Motrin and Tylenol for pain control. Patient agrees. We have discussed  return precautions which she is verbalized back to me demonstrating understanding.   Evaluation does not show pathology that would require ongoing emergent intervention or inpatient treatment. Pt is hemodynamically stable and mentating appropriately. Discussed findings and plan with patient/guardian, who agrees with care plan. All questions answered. Return precautions discussed and outpatient follow up given.   New Prescriptions   METRONIDAZOLE (FLAGYL) 500 MG TABLET    Take 1 tablet (500 mg total) by mouth 2 (two) times daily. One po bid x 7 days         Wynetta Emeryicole Ereka Brau, PA-C 11/24/13 1623

## 2013-11-24 NOTE — Progress Notes (Signed)
[facility] provided pt with a list of primary care resources and a [facility] [employer] Card application to help patient establish primary care.  °

## 2013-11-24 NOTE — ED Notes (Signed)
Redness , itching and small hives noted to IV insertion site after 1/2 of Morphine administration. PA Joni Reining at bedside and made aware. IV site patent and tested by PA Joni Reining as well as this Char[employer] fundraiser. Pt denies any pain when NS is administered. Other 1/2 of Morphine wasted in sharps container.

## 2013-11-24 NOTE — ED Notes (Signed)
Pt ambulated to restroom with steady gait.

## 2013-11-24 NOTE — Discharge Instructions (Signed)
°  Take your antibiotics as directed and to completion. You should never have any leftover antibiotics! Push fluids and stay well hydrated.   Do not drink alcohol while you are taking flagyl (metronidazole) because it will make you very sick.   Do not hesitate to return to the Emergency Department for any new, worsening or concerning symptoms.   If you do not have a primary care doctor you can establish one at the   [facility]: [address] [place] [address] [phone]  After you establish care. Let them know you were seen in the emergency room. They must obtain records for further management.

## 2013-11-24 NOTE — ED Notes (Signed)
Inflamed site marked to monitor for swelling and hives

## 2013-11-24 NOTE — ED Notes (Addendum)
Pt c/o lower right abd pain since last night. C/o nausea but no v/d. Had fish on the grill last night.

## 2013-11-25 LAB — GC/CHLAMYDIA PROBE AMP
CT Probe RNA: NEGATIVE
GC Probe RNA: NEGATIVE

## 2013-11-25 NOTE — ED Provider Notes (Signed)
Medical screening examination/treatment/procedure(s) were performed by non-physician practitioner and as supervising physician I was immediately available for consultation/collaboration.  Flint Melter, MD 11/25/13 [phone]

## 2013-12-22 ENCOUNTER — Emergency Department (HOSPITAL_COMMUNITY): Payer: Self-pay

## 2013-12-22 ENCOUNTER — Emergency Department (HOSPITAL_COMMUNITY)
Admission: EM | Admit: 2013-12-22 | Discharge: 2013-12-23 | Disposition: A | Discharge status: Home / Self Care | Payer: Self-pay | Attending: Emergency Medicine | Admitting: Emergency Medicine

## 2013-12-22 ENCOUNTER — Encounter (HOSPITAL_COMMUNITY): Payer: Self-pay | Admitting: Emergency Medicine

## 2013-12-22 DIAGNOSIS — J189 Pneumonia, unspecified organism: Secondary | ICD-10-CM

## 2013-12-22 DIAGNOSIS — J159 Unspecified bacterial pneumonia: Secondary | ICD-10-CM | POA: Insufficient documentation

## 2013-12-22 DIAGNOSIS — Z3202 Encounter for pregnancy test, result negative: Secondary | ICD-10-CM | POA: Insufficient documentation

## 2013-12-22 DIAGNOSIS — Z8719 Personal history of other diseases of the digestive system: Secondary | ICD-10-CM | POA: Insufficient documentation

## 2013-12-22 DIAGNOSIS — F17200 Nicotine dependence, unspecified, uncomplicated: Secondary | ICD-10-CM | POA: Insufficient documentation

## 2013-12-22 HISTORY — DX: Opioid abuse, uncomplicated: F11.10

## 2013-12-22 LAB — PREGNANCY, URINE: Preg Test, Ur: NEGATIVE

## 2013-12-22 MED ORDER — KETOROLAC TROMETHAMINE 60 MG/2ML IM SOLN
30.0000 mg | Freq: Once | INTRAMUSCULAR | Status: DC
  Filled 2013-12-22: qty 2

## 2013-12-22 NOTE — ED Notes (Signed)
Patient is alert and oriented x3.  She is complaining of URQ pain that started this morning. Patient states that the pain has gotten worse over the last hour.  Currently she rates her pain  9 of 10.  Patient is a methadone clinic patient

## 2013-12-22 NOTE — ED Notes (Signed)
Bed: [id number] Expected date: 12/22/13 Expected time: 11:01 PM Means of arrival: Ambulance Comments: Side pain

## 2013-12-22 NOTE — ED Provider Notes (Signed)
CSN: [id number]     Arrival date & time 12/22/13  2312 History   First MD Initiated Contact with Patient 12/22/13 2315     Chief Complaint  Patient presents with  . Abdominal Pain    URQ     (Consider location/radiation/quality/duration/timing/severity/associated sxs/prior Treatment) Patient is a 43 y.o. female presenting with abdominal pain.  Abdominal Pain   Alyssa Kelley is a 43 y.o. female complaining of acute onset of right chest right upper quadrant pain onset 30 minutes prior to arrival, pain is severe, states she only feels pain when she takes a deep breath patient reports a dry cough. She denies fever, chills, hemoptysis, shortness of breath, exogenous estrogen, recent travel, history of DVT or PE, nausea, vomiting, change in bowel or bladder habits, abnormal vaginal discharge. 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  Past Medical History  Diagnosis Date  . Abscess of mouth   . Heroin abuse    History reviewed. No pertinent past surgical history. History reviewed. No pertinent family history. History  Substance Use Topics  . Smoking status: Current Every Day Smoker    Types: Cigarettes  . Smokeless tobacco: Not on file  . Alcohol Use: Yes   OB History   Grav Para Term Preterm Abortions TAB SAB Ect Mult Living                 Review of Systems  Gastrointestinal: Positive for abdominal pain.    10 systems reviewed and found to be negative, except as noted in the HPI.   Allergies  Review of patient's allergies indicates no known allergies.  Home Medications   Prior to Admission medications   Medication Sig Start Date End Date Taking? Authorizing Provider  METHADONE HCL PO Take 100 mg by mouth daily.   Yes Historical Provider, MD   BP 120/86  Pulse 78  Temp(Src) 98.1 F (36.7 C) (Oral)  Resp 20  Ht 5\' 1"  (1.549 m)  Wt 178 lb (80.74 kg)  BMI 33.65 kg/m2  SpO2 99%  LMP 11/10/2013 Physical Exam  Nursing note and vitals  reviewed. Constitutional: She is oriented to person, place, and time. She appears well-developed and well-nourished. No distress.  HENT:  Head: Normocephalic and atraumatic.  Mouth/Throat: Oropharynx is clear and moist.  Eyes: Conjunctivae and EOM are normal. Pupils are equal, round, and reactive to light.  Neck: Normal range of motion.  Cardiovascular: Normal rate, regular rhythm and intact distal pulses.   Pulmonary/Chest: Effort normal and breath sounds normal. No stridor. No respiratory distress. She has no wheezes. She has no rales.  Abdominal: Soft. Bowel sounds are normal. She exhibits no distension and no mass. There is no tenderness. There is no rebound and no guarding.  Murphy sign negative, no tenderness to palpation over McBurney's point, Rovsings, Psoas and obturator all negative.   Musculoskeletal: Normal range of motion. She exhibits no edema and no tenderness.  No calf asymmetry, superficial collaterals, palpable cords, edema, Homans sign negative bilaterally.    Neurological: She is alert and oriented to person, place, and time.  Skin: Skin is warm. No rash noted.  Psychiatric: She has a normal mood and affect.    ED Course  Procedures (including critical care time) Labs Review Labs Reviewed  URINALYSIS, ROUTINE W REFLEX MICROSCOPIC - Abnormal; Notable for the following:    Color, Urine AMBER (*)    Specific Gravity, Urine 1.033 (*)    Hgb urine dipstick TRACE (*)    Leukocytes, UA  SMALL (*)    All other components within normal limits  URINE MICROSCOPIC-ADD ON - Abnormal; Notable for the following:    Bacteria, UA FEW (*)    All other components within normal limits  PREGNANCY, URINE  D-DIMER, QUANTITATIVE  CBC WITH DIFFERENTIAL  BASIC METABOLIC PANEL  HEPATIC FUNCTION PANEL  LIPASE, BLOOD  CBC WITH DIFFERENTIAL    Imaging Review No results found.   EKG Interpretation None      MDM   Final diagnoses:  None    Filed Vitals:   12/22/13 2317   BP: 120/86  Pulse: 78  Temp: 98.1 F (36.7 C)  TempSrc: Oral  Resp: 20  Height: 5\' 1"  (1.549 m)  Weight: 178 lb (80.74 kg)  SpO2: 99%    Medications  ketorolac (TORADOL) injection 30 mg (30 mg Intramuscular Given 12/23/13 0029)    Alyssa Kelley is a 43 y.o. female presenting with acute onset of what she describes as right upper quadrant pain. On my physical exam deep palpation of the right upper quadrant shows no tenderness to palpation. The pain appears to be more in the right chest. Physical exam is not consistent with a DVT however given the acute onset of pain and pleuritic nature d-dimer is ordered.  D-dimer is negative, UA and urine pregnancy also unremarkable. Chest x-ray, abdominal ultrasound and majority of blood work pending. [facility].       Wynetta Emeryicole Hanzel Pizzo, PA-C 12/23/13 [phone]

## 2013-12-22 NOTE — ED Notes (Signed)
Pt a hard stick, unable to insert peripheral IV. PA states it is ok to do blood work and hold off on IV for now. Phlebotomist at bedside.

## 2013-12-23 LAB — URINALYSIS, ROUTINE W REFLEX MICROSCOPIC
BILIRUBIN URINE: NEGATIVE
Glucose, UA: NEGATIVE mg/dL
KETONES UR: NEGATIVE mg/dL
Nitrite: NEGATIVE
Protein, ur: NEGATIVE mg/dL
SPECIFIC GRAVITY, URINE: 1.033 — AB (ref 1.005–1.030)
UROBILINOGEN UA: 1 mg/dL (ref 0.0–1.0)
pH: 5.5 (ref 5.0–8.0)

## 2013-12-23 LAB — CBC WITH DIFFERENTIAL/PLATELET
BASOS PCT: 0 % (ref 0–1)
Basophils Absolute: 0 10*3/uL (ref 0.0–0.1)
Eosinophils Absolute: 0 10*3/uL (ref 0.0–0.7)
Eosinophils Relative: 0 % (ref 0–5)
HCT: 39 % (ref 36.0–46.0)
Hemoglobin: 13.1 g/dL (ref 12.0–15.0)
LYMPHS PCT: 8 % — AB (ref 12–46)
Lymphs Abs: 1.3 10*3/uL (ref 0.7–4.0)
MCH: 30.7 pg (ref 26.0–34.0)
MCHC: 33.6 g/dL (ref 30.0–36.0)
MCV: 91.3 fL (ref 78.0–100.0)
Monocytes Absolute: 1.5 10*3/uL — ABNORMAL HIGH (ref 0.1–1.0)
Monocytes Relative: 9 % (ref 3–12)
NEUTROS PCT: 83 % — AB (ref 43–77)
Neutro Abs: 14.4 10*3/uL — ABNORMAL HIGH (ref 1.7–7.7)
Platelets: 223 10*3/uL (ref 150–400)
RBC: 4.27 MIL/uL (ref 3.87–5.11)
RDW: 13.6 % (ref 11.5–15.5)
WBC: 17.1 10*3/uL — ABNORMAL HIGH (ref 4.0–10.5)

## 2013-12-23 LAB — BASIC METABOLIC PANEL
Anion gap: 12 (ref 5–15)
BUN: 10 mg/dL (ref 6–23)
CALCIUM: 8.9 mg/dL (ref 8.4–10.5)
CO2: 25 mEq/L (ref 19–32)
Chloride: 100 mEq/L (ref 96–112)
Creatinine, Ser: 0.66 mg/dL (ref 0.50–1.10)
Glucose, Bld: 99 mg/dL (ref 70–99)
POTASSIUM: 3.9 meq/L (ref 3.7–5.3)
SODIUM: 137 meq/L (ref 137–147)

## 2013-12-23 LAB — HEPATIC FUNCTION PANEL
ALT: 55 U/L — ABNORMAL HIGH (ref 0–35)
AST: 52 U/L — ABNORMAL HIGH (ref 0–37)
Albumin: 2.9 g/dL — ABNORMAL LOW (ref 3.5–5.2)
Alkaline Phosphatase: 79 U/L (ref 39–117)
BILIRUBIN TOTAL: 0.4 mg/dL (ref 0.3–1.2)
Bilirubin, Direct: <0.2 mg/dL (ref 0.0–0.3)
TOTAL PROTEIN: 7.8 g/dL (ref 6.0–8.3)

## 2013-12-23 LAB — URINE MICROSCOPIC-ADD ON

## 2013-12-23 LAB — D-DIMER, QUANTITATIVE: D-Dimer, Quant: 0.39 ug/mL-FEU (ref 0.00–0.48)

## 2013-12-23 LAB — LIPASE, BLOOD: Lipase: 19 U/L (ref 11–59)

## 2013-12-23 MED ORDER — IBUPROFEN 600 MG PO TABS: 600.0000 mg | ORAL_TABLET | Freq: Four times a day (QID) | ORAL | Status: DC | PRN

## 2013-12-23 MED ORDER — LEVOFLOXACIN 500 MG PO TABS
750.0000 mg | ORAL_TABLET | Freq: Once | ORAL | Status: AC
  Administered 2013-12-23: 750 mg via ORAL
  Filled 2013-12-23: qty 2

## 2013-12-23 MED ORDER — IBUPROFEN 800 MG PO TABS: 800.0000 mg | ORAL_TABLET | Freq: Once | ORAL | Status: DC

## 2013-12-23 MED ORDER — AMOXICILLIN-POT CLAVULANATE 875-125 MG PO TABS: 1.0000 | ORAL_TABLET | Freq: Two times a day (BID) | ORAL | Status: DC

## 2013-12-23 MED ORDER — KETOROLAC TROMETHAMINE 60 MG/2ML IM SOLN
30.0000 mg | Freq: Once | INTRAMUSCULAR | Status: AC
  Administered 2013-12-23: 30 mg via INTRAMUSCULAR

## 2013-12-23 NOTE — ED Provider Notes (Signed)
Medical screening examination/treatment/procedure(s) were performed by non-physician practitioner and as supervising physician I was immediately available for consultation/collaboration.   EKG Interpretation None        Hanley SeamenJohn L Nachman Sundt, MD 12/23/13 [phone]

## 2013-12-23 NOTE — Discharge Instructions (Signed)

## 2013-12-23 NOTE — ED Notes (Signed)
Patient is alert and oriented x3.  She was given DC instructions and follow up visit instructions.  Patient gave verbal understanding. She was DC ambulatory under her own power to home.  V/S stable.  He was not showing any signs of distress on DC 

## 2013-12-23 NOTE — ED Provider Notes (Signed)
0230 - patient care assumed from [facility], PA-C at shift change. Patient with labs and imaging pending. She presents today for right thoracic back/chest wall/upper abdominal discomfort. Labs today significant for leukocytosis of 17.1. LFTs at baseline. Lipase normal. Chest x-ray findings consistent with pneumonia to the right mid lung field. Imaging findings consistent with location of patient's discomfort.  Patient treated in ED with Levaquin. She ambulates in the ED without hypoxia. Believe patient is stable and appropriate for discharge for outpatient management of her CAP. Will prescribe Augmentin BID x 10 days and ibuprofen for pain control. PCP f/u advised; referral to [facility] given. Return precautions provided and patient agreeable to plan with no unaddressed concerns.   Filed Vitals:   12/22/13 2317 12/23/13 0215  BP: 120/86   Pulse: 78   Temp: 98.1 F (36.7 C)   TempSrc: Oral   Resp: 20   Height: 5\' 1"  (1.549 m)   Weight: 178 lb (80.74 kg)   SpO2: 99% 97%   Results for orders placed during the hospital encounter of 12/22/13  BASIC METABOLIC PANEL      Result Value Ref Range   Sodium 137  137 - 147 mEq/L   Potassium 3.9  3.7 - 5.3 mEq/L   Chloride 100  96 - 112 mEq/L   CO2 25  19 - 32 mEq/L   Glucose, Bld 99  70 - 99 mg/dL   BUN 10  6 - 23 mg/dL   Creatinine, Ser 1.61  0.50 - 1.10 mg/dL   Calcium 8.9  8.4 - 09.6 mg/dL   GFR calc non Af Amer >90  >90 mL/min   GFR calc Af Amer >90  >90 mL/min   Anion gap 12  5 - 15  HEPATIC FUNCTION PANEL      Result Value Ref Range   Total Protein 7.8  6.0 - 8.3 g/dL   Albumin 2.9 (*) 3.5 - 5.2 g/dL   AST 52 (*) 0 - 37 U/L   ALT 55 (*) 0 - 35 U/L   Alkaline Phosphatase 79  39 - 117 U/L   Total Bilirubin 0.4  0.3 - 1.2 mg/dL   Bilirubin, Direct <0.4  0.0 - 0.3 mg/dL   Indirect Bilirubin NOT CALCULATED  0.3 - 0.9 mg/dL  LIPASE, BLOOD      Result Value Ref Range   Lipase 19  11 - 59 U/L  URINALYSIS, ROUTINE W REFLEX MICROSCOPIC      Result Value Ref Range   Color, Urine AMBER (*) YELLOW   APPearance CLEAR  CLEAR   Specific Gravity, Urine 1.033 (*) 1.005 - 1.030   pH 5.5  5.0 - 8.0   Glucose, UA NEGATIVE  NEGATIVE mg/dL   Hgb urine dipstick TRACE (*) NEGATIVE   Bilirubin Urine NEGATIVE  NEGATIVE   Ketones, ur NEGATIVE  NEGATIVE mg/dL   Protein, ur NEGATIVE  NEGATIVE mg/dL   Urobilinogen, UA 1.0  0.0 - 1.0 mg/dL   Nitrite NEGATIVE  NEGATIVE   Leukocytes, UA SMALL (*) NEGATIVE  PREGNANCY, URINE      Result Value Ref Range   Preg Test, Ur NEGATIVE  NEGATIVE  D-DIMER, QUANTITATIVE      Result Value Ref Range   D-Dimer, Quant 0.39  0.00 - 0.48 ug/mL-FEU  URINE MICROSCOPIC-ADD ON      Result Value Ref Range   Squamous Epithelial / LPF RARE  RARE   WBC, UA 3-6  <3 WBC/hpf   RBC / HPF 3-6  <3 RBC/hpf  Bacteria, UA FEW (*) RARE   Urine-Other MUCOUS PRESENT    CBC WITH DIFFERENTIAL      Result Value Ref Range   WBC 17.1 (*) 4.0 - 10.5 K/uL   RBC 4.27  3.87 - 5.11 MIL/uL   Hemoglobin 13.1  12.0 - 15.0 g/dL   HCT 16.139.0  09.636.0 - 04.546.0 %   MCV 91.3  78.0 - 100.0 fL   MCH 30.7  26.0 - 34.0 pg   MCHC 33.6  30.0 - 36.0 g/dL   RDW 40.913.6  81.111.5 - 91.415.5 %   Platelets 223  150 - 400 K/uL   Neutrophils Relative % 83 (*) 43 - 77 %   Neutro Abs 14.4 (*) 1.7 - 7.7 K/uL   Lymphocytes Relative 8 (*) 12 - 46 %   Lymphs Abs 1.3  0.7 - 4.0 K/uL   Monocytes Relative 9  3 - 12 %   Monocytes Absolute 1.5 (*) 0.1 - 1.0 K/uL   Eosinophils Relative 0  0 - 5 %   Eosinophils Absolute 0.0  0.0 - 0.7 K/uL   Basophils Relative 0  0 - 1 %   Basophils Absolute 0.0  0.0 - 0.1 K/uL   Dg Chest 2 View  12/23/2013   CLINICAL DATA:  ABDOMINAL PAIN  EXAM: CHEST  2 VIEW  COMPARISON:  None.  FINDINGS: Cardiac and mediastinal silhouettes are within normal limits.  Lungs are normally inflated. Patchy and hazy infiltrate seen within the right mid lung, suspicious for pneumonia. Additional patchy opacity within the left lung base may reflect atelectasis  or infiltrate. No pneumothorax. No pulmonary edema or pleural effusion.  No acute osseus abnormality.  IMPRESSION: 1. Patchy infiltrate within the right mid lung, suspicious for pneumonia. 2. Linear and patchy opacities at the left lung base, which may reflect atelectasis or additional infiltrate.   Electronically Signed   By: Rise MuBenjamin  McClintock M.D.   On: 12/23/2013 01:24   [place] Abdomen Complete  12/23/2013   CLINICAL DATA:  Right upper quadrant pain.  EXAM: ULTRASOUND ABDOMEN COMPLETE  COMPARISON:  11/18/2004  FINDINGS: Gallbladder:  No gallstones or wall thickening visualized. No sonographic Murphy sign noted.  Common bile duct:  Diameter: 5 mm  Liver:  No focal lesion identified. Within normal limits in parenchymal echogenicity.  IVC:  No abnormality visualized.  Pancreas:  Visualized portion unremarkable.  Spleen:  Size and appearance within normal limits.  Right Kidney:  Length: 11.9 cm. Echogenicity within normal limits. No mass or hydronephrosis visualized.  Left Kidney:  Length: 10.3 cm. Echogenicity within normal limits. No mass or hydronephrosis visualized.  Abdominal aorta:  No aneurysm visualized.  Other findings:  None.  IMPRESSION: Negative. No evidence of gallstones, hydronephrosis, or other significant abnormality.   Electronically Signed   By: Myles RosenthalJohn  Stahl M.D.   On: 12/23/2013 01:13   Ct Abdomen Pelvis W Contrast  11/24/2013   CLINICAL DATA:  43 year old female with right lower quadrant pain. Initial encounter.  EXAM: CT ABDOMEN AND PELVIS WITH CONTRAST  TECHNIQUE: Multidetector CT imaging of the abdomen and pelvis was performed using the standard protocol following bolus administration of intravenous contrast.  CONTRAST:  50mL OMNIPAQUE IOHEXOL 300 MG/ML SOLN, 100mL OMNIPAQUE IOHEXOL 300 MG/ML SOLN  COMPARISON:  Abdominal ultrasound 11/18/2004.  FINDINGS: Mild dependent atelectasis.  No pericardial or pleural effusion.  No acute osseous abnormality identified.  No pelvic free fluid.  Negative  rectum.  Negative uterus and adnexa.  Redundant sigmoid colon, otherwise negative. Stool throughout  the colon which is nondilated. Other colon segments including the cecum and appendix are normal. Oral contrast in small bowel loops but has not yet reached the distal small bowel. No dilated or inflamed small bowel. Negative stomach and duodenum.  Mild fatty infiltration of the liver along the falciform ligament. The liver otherwise appears normal. Negative gallbladder, spleen, pancreas, adrenal glands, and portal venous system. Major arterial structures in the abdomen and pelvis are patent. No abdominal free fluid.  Mild right renal midpole scarring. Otherwise negative kidneys. No nephrolithiasis or perinephric stranding. Normal renal contrast excretion on delayed images. No lymphadenopathy identified. Unremarkable bladder.  IMPRESSION: Normal appendix and no acute or inflammatory findings identified in the abdomen or pelvis.   Electronically Signed   By: Augusto GambleLee  Hall M.D.   On: 11/24/2013 15:03      Antony MaduraKelly Zayd Bonet, PA-C 12/23/13 0240  Antony MaduraKelly Quinette Hentges, PA-C 12/23/13 0300

## 2013-12-25 ENCOUNTER — Emergency Department (HOSPITAL_COMMUNITY): Payer: Self-pay

## 2013-12-25 ENCOUNTER — Emergency Department (HOSPITAL_COMMUNITY)
Admission: EM | Admit: 2013-12-25 | Discharge: 2013-12-25 | Disposition: A | Discharge status: Home / Self Care | Payer: Self-pay | Attending: Emergency Medicine | Admitting: Emergency Medicine

## 2013-12-25 ENCOUNTER — Encounter (HOSPITAL_COMMUNITY): Payer: Self-pay | Admitting: Emergency Medicine

## 2013-12-25 DIAGNOSIS — Z792 Long term (current) use of antibiotics: Secondary | ICD-10-CM | POA: Insufficient documentation

## 2013-12-25 DIAGNOSIS — J189 Pneumonia, unspecified organism: Secondary | ICD-10-CM

## 2013-12-25 DIAGNOSIS — F17200 Nicotine dependence, unspecified, uncomplicated: Secondary | ICD-10-CM | POA: Insufficient documentation

## 2013-12-25 DIAGNOSIS — J159 Unspecified bacterial pneumonia: Secondary | ICD-10-CM | POA: Insufficient documentation

## 2013-12-25 DIAGNOSIS — Z8719 Personal history of other diseases of the digestive system: Secondary | ICD-10-CM | POA: Insufficient documentation

## 2013-12-25 DIAGNOSIS — Z79899 Other long term (current) drug therapy: Secondary | ICD-10-CM | POA: Insufficient documentation

## 2013-12-25 MED ORDER — IBUPROFEN 800 MG PO TABS
800.0000 mg | ORAL_TABLET | Freq: Once | ORAL | Status: AC
  Administered 2013-12-25: 800 mg via ORAL
  Filled 2013-12-25: qty 1

## 2013-12-25 NOTE — ED Provider Notes (Signed)
CSN: [id number]     Arrival date & time 12/25/13  1546 History   First MD Initiated Contact with Patient 12/25/13 1610     Chief Complaint  Patient presents with  . Shortness of Breath     (Consider location/radiation/quality/duration/timing/severity/associated sxs/prior Treatment) Patient is a 43 y.o. female presenting with cough.  Cough Cough characteristics:  Productive Sputum characteristics:  Wallace Cullens Severity:  Moderate Onset quality:  Gradual Duration:  3 days Timing:  Constant Progression:  Unchanged Chronicity:  New Smoker: yes   Context comment:  Seen in the ED about 2 days ago, dx with pneumonia, but can't afford her Augmentin Relieved by:  Nothing Worsened by:  Nothing tried Ineffective treatments:  None tried Associated symptoms: chest pain (right lower) and shortness of breath   Associated symptoms: no fever     Past Medical History  Diagnosis Date  . Abscess of mouth   . Heroin abuse    History reviewed. No pertinent past surgical history. History reviewed. No pertinent family history. History  Substance Use Topics  . Smoking status: Current Every Day Smoker    Types: Cigarettes  . Smokeless tobacco: Not on file  . Alcohol Use: Yes   OB History   Grav Para Term Preterm Abortions TAB SAB Ect Mult Living                 Review of Systems  Constitutional: Negative for fever.  Respiratory: Positive for cough and shortness of breath.   Cardiovascular: Positive for chest pain (right lower).  All other systems reviewed and are negative.     Allergies  Review of patient's allergies indicates no known allergies.  Home Medications   Prior to Admission medications   Medication Sig Start Date End Date Taking? Authorizing Provider  METHADONE HCL PO Take 100 mg by mouth daily.   Yes Historical Provider, MD  amoxicillin-clavulanate (AUGMENTIN) 875-125 MG per tablet Take 1 tablet by mouth 2 (two) times daily. 12/23/13   Antony Madura, PA-C   BP 136/81  Pulse  87  Temp(Src) 98.6 F (37 C) (Oral)  Resp 19  SpO2 99%  LMP 12/25/2013 Physical Exam  Nursing note and vitals reviewed. Constitutional: She is oriented to person, place, and time. She appears well-developed and well-nourished. No distress.  HENT:  Head: Normocephalic and atraumatic.  Mouth/Throat: Oropharynx is clear and moist.  Eyes: Conjunctivae are normal. Pupils are equal, round, and reactive to light. No scleral icterus.  Neck: Neck supple.  Cardiovascular: Normal rate, regular rhythm, normal heart sounds and intact distal pulses.   No murmur heard. Pulmonary/Chest: Effort normal and breath sounds normal. No stridor. No respiratory distress. She has no rales.  Abdominal: Soft. Bowel sounds are normal. She exhibits no distension. There is no tenderness.  Musculoskeletal: Normal range of motion.  Neurological: She is alert and oriented to person, place, and time.  Skin: Skin is warm and dry. No rash noted.  Psychiatric: She has a normal mood and affect. Her behavior is normal.    ED Course  Procedures (including critical care time) Labs Review Labs Reviewed - No data to display  Imaging Review No results found.   EKG Interpretation None      MDM   Final diagnoses:  Community acquired pneumonia    43 yo female diagnosed with pneumonia after being evaluated for chest pain a few days ago.  She has had a purulent cough, mild SOB, and chest pain.  She had an extensive workup a few days  ago, including negative RUQ US, negative D Dimer.  She reports that she is no worse than she was a few days ago, but has not gotten any better.  She came to the ED only because she needed help getting her antibiotics filled.  Social work has seen the patient and has assisted the patient in obtaining her prescription (which she still has).  I don't think we need repeat labs or imaging.  She appears stable for continued outpatient treatment.    Candyce ChurnJohn David Trell Secrist III, MD 12/25/13 [id number]

## 2013-12-25 NOTE — Discharge Instructions (Signed)

## 2013-12-25 NOTE — Progress Notes (Signed)
  CARE MANAGEMENT ED NOTE 12/25/2013  Patient:  Wynelle BeckmannWILSON,Kymoni N   Account Number:  [id number]  Date Initiated:  12/25/2013  Documentation initiated by:  Radford PaxFERRERO,Avyon Herendeen  Subjective/Objective Assessment:   Patient presents to Ed with needing assistance with filling her antibiotic prescription.     Subjective/Objective Assessment Detail:   Patient diagnosed with pneumonia a few dats ago.     Action/Plan:   Action/Plan Detail:   Anticipated DC Date:  12/25/2013     Status Recommendation to Physician:   Result of Recommendation:    Other ED Services  Consult Working Plan    DC Planning Services  CM consult  Medication Assistance  Other  PCP issues    Choice offered to / List presented to:            Status of service:  Completed, signed off  ED Comments:   ED Comments Detail:  EDCM spoke toi patient at bedside.  Patien confirms she [place] a [place]cp [place]. EDCM asked patient is she was homeless?  Patient replied, "I go here and there."  Patient informed EDCm that she is also unemployed.  [facility] June 2015.  Patient was given resources for finding a pcp, information on the [facility], financial resources int he community.  [facility] but they would not fill them.  [facility] with [facility].  [facility] informed [facility] that [facility] Mon-Thurs from 9am-1030am.  EDCM informed patient that she may eceive assistance with her medications, speak to a Engineer, agriculturalsocial worker or financial counselor at the [facility].  EDCM stressed the importance of having a pcp.  [facility] provided [facility] with [facility] letter and list of participatient pharmacies.  EDCM instructed the patient to take the letter to the pharmacy with her prescription within seven days otherwise the Match letter will expire.  [facility] also informed patient that she will not be eligible again for this service  until July of 2016.  Patient is agreeable to three dollar copay. Patient thankful for assistance.  No further EDCM needs at this time.

## 2013-12-25 NOTE — ED Notes (Signed)
Patient c/o increased SOB. Patient states she was seen on 12/22/13 and was diagnosed with pneumonia. Patient states she was unable to get antibiotics filled due to lack of money.

## 2014-05-12 ENCOUNTER — Encounter (HOSPITAL_COMMUNITY): Payer: Self-pay

## 2014-05-12 ENCOUNTER — Emergency Department (HOSPITAL_COMMUNITY)
Admission: EM | Admit: 2014-05-12 | Discharge: 2014-05-12 | Disposition: A | Discharge status: Home / Self Care | Payer: Self-pay | Attending: Emergency Medicine | Admitting: Emergency Medicine

## 2014-05-12 ENCOUNTER — Emergency Department (HOSPITAL_COMMUNITY): Payer: Self-pay

## 2014-05-12 DIAGNOSIS — Z8701 Personal history of pneumonia (recurrent): Secondary | ICD-10-CM | POA: Insufficient documentation

## 2014-05-12 DIAGNOSIS — R059 Cough, unspecified: Secondary | ICD-10-CM

## 2014-05-12 DIAGNOSIS — Z720 Tobacco use: Secondary | ICD-10-CM | POA: Insufficient documentation

## 2014-05-12 DIAGNOSIS — Z792 Long term (current) use of antibiotics: Secondary | ICD-10-CM | POA: Insufficient documentation

## 2014-05-12 DIAGNOSIS — Z8719 Personal history of other diseases of the digestive system: Secondary | ICD-10-CM | POA: Insufficient documentation

## 2014-05-12 DIAGNOSIS — Z79899 Other long term (current) drug therapy: Secondary | ICD-10-CM | POA: Insufficient documentation

## 2014-05-12 DIAGNOSIS — R05 Cough: Secondary | ICD-10-CM | POA: Insufficient documentation

## 2014-05-12 MED ORDER — DM-GUAIFENESIN ER 30-600 MG PO TB12: 1.0000 | ORAL_TABLET | Freq: Two times a day (BID) | ORAL | Status: DC

## 2014-05-12 NOTE — Discharge Instructions (Signed)
Cough, Adult  A cough is a reflex that helps clear your throat and airways. It can help heal the body or may be a reaction to an irritated airway. A cough may only last 2 or 3 weeks (acute) or may last more than 8 weeks (chronic).  CAUSES Acute cough:  Viral or bacterial infections. Chronic cough:  Infections.  Allergies.  Asthma.  Post-nasal drip.  Smoking.  Heartburn or acid reflux.  Some medicines.  Chronic lung problems (COPD).  Cancer. SYMPTOMS   Cough.  Fever.  Chest pain.  Increased breathing rate.  High-pitched whistling sound when breathing (wheezing).  Colored mucus that you cough up (sputum). TREATMENT   A bacterial cough may be treated with antibiotic medicine.  A viral cough must run its course and will not respond to antibiotics.  Your caregiver may recommend other treatments if you have a chronic cough. HOME CARE INSTRUCTIONS   Only take over-the-counter or prescription medicines for pain, discomfort, or fever as directed by your caregiver. Use cough suppressants only as directed by your caregiver.  Use a cold steam vaporizer or humidifier in your bedroom or home to help loosen secretions.  Sleep in a semi-upright position if your cough is worse at night.  Rest as needed.  Stop smoking if you smoke. SEEK IMMEDIATE MEDICAL CARE IF:   You have pus in your sputum.  Your cough starts to worsen.  You cannot control your cough with suppressants and are losing sleep.  You begin coughing up blood.  You have difficulty breathing.  You develop pain which is getting worse or is uncontrolled with medicine.  You have a fever. MAKE SURE YOU:   Understand these instructions.  Will watch your condition.  Will get help right away if you are not doing well or get worse. Document Released: 12/02/2010 Document Revised: 08/28/2011 Document Reviewed: 12/02/2010 [facility] Patient Information 2015 [employer] [place]. This information is not intended  to replace advice given to you by your health care provider. Make sure you discuss any questions you have with your health care provider.   You were evaluated in the ED today for your cough. There does not appear to be any emergent or acute causes for your cough. Your chest x-ray did not show any evidence of a pneumonia. Please follow-up with your primary care for further evaluation and management of your symptoms. Please take your prescribed medication as directed. Return to ED for worsening symptoms, fevers, chest pain or other respiratory difficulty

## 2014-05-12 NOTE — ED Provider Notes (Signed)
CSN: [id number]     Arrival date & time 05/12/14  1045 History   First MD Initiated Contact with Patient 05/12/14 1058     Chief Complaint  Patient presents with  . Cough     (Consider location/radiation/quality/duration/timing/severity/associated sxs/prior Treatment) HPI Alyssa Kelley is a 43 y.o. female who is here for evaluation of cough. Patient states she was treated for a pneumonia here 2 months ago, completed her outpatient antibiotics but has had a persistent cough since then. She has tried Mucinex, cough drops and Delsym with some relief. She denies any fevers, shortness of breath, chest pain or other URI symptoms. She reports her cough is sometimes productive with a green sputum. She is currently still smoking. Denies any recent travel, unilateral leg swelling, hemoptysis, recent surgeries or history of clot.  Past Medical History  Diagnosis Date  . Abscess of mouth   . Heroin abuse    History reviewed. No pertinent past surgical history. History reviewed. No pertinent family history. History  Substance Use Topics  . Smoking status: Current Every Day Smoker    Types: Cigarettes  . Smokeless tobacco: Not on file  . Alcohol Use: Yes   OB History    No data available     Review of Systems  Respiratory: Positive for cough.   All other systems reviewed and are negative.     Allergies  Review of patient's allergies indicates no known allergies.  Home Medications   Prior to Admission medications   Medication Sig Start Date End Date Taking? Authorizing Provider  amoxicillin-clavulanate (AUGMENTIN) 875-125 MG per tablet Take 1 tablet by mouth 2 (two) times daily. 12/23/13   Antony MaduraKelly Humes, PA-C  METHADONE HCL PO Take 100 mg by mouth daily.    Historical Provider, MD   BP 148/85 mmHg  Pulse 66  Temp(Src) 98.5 F (36.9 C) (Oral)  Resp 20  SpO2 98%  LMP 05/05/2014 Physical Exam  Constitutional: She is oriented to person, place, and time. She appears well-developed  and well-nourished.  HENT:  Head: Normocephalic and atraumatic.  Mouth/Throat: Oropharynx is clear and moist.  Eyes: Conjunctivae and EOM are normal. Pupils are equal, round, and reactive to light. Right eye exhibits no discharge. Left eye exhibits no discharge. No scleral icterus.  Neck: Neck supple. No JVD present.  No meningismus  Cardiovascular: Normal rate, regular rhythm and normal heart sounds.   Pulmonary/Chest: Effort normal and breath sounds normal. No respiratory distress. She has no wheezes. She has no rales.  Abdominal: Soft. There is no tenderness.  Musculoskeletal: She exhibits no tenderness.  Neurological: She is alert and oriented to person, place, and time.  Cranial Nerves II-XII grossly intact  Skin: Skin is warm and dry. No rash noted.  Psychiatric: She has a normal mood and affect.  Nursing note and vitals reviewed.   ED Course  Procedures (including critical care time) Labs Review Labs Reviewed - No data to display  Imaging Review No results found.   EKG Interpretation None      MDM  Vitals stable - WNL -afebrile Pt resting comfortably in ED. PE--not concerning for other acute or emergent pathology. Patient's persistent cough may be multifactorial with consistent smoking on top of a viral syndrome. PERC Neg. Doubt PE or other vascular pathology. Low concern for ACS.  Imaging--chest x-ray shows no acute cardiopulmonary pathology. No evidence of pneumonia Discussed treatment with Mucinex DM and continued supportive care. Education provided on smoking cessation.  Discussed f/u with PCP and return  precautions, pt very amenable to plan. Patient stable, in good condition and is appropriate for discharge  Final diagnoses:  Cough       Sharlene MottsBenjamin W Mardy Lucier, PA-C 05/12/14 1202  Samuel JesterKathleen McManus, DO 05/12/14 1652

## 2014-05-12 NOTE — ED Notes (Signed)
Per pt, had pneumonia x 2 months ago.  Pt has been having cough for 3 weeks.  Phlegm.  No fever.  Slight shortness of breath.

## 2014-07-03 ENCOUNTER — Encounter (HOSPITAL_COMMUNITY): Payer: Self-pay

## 2014-07-03 ENCOUNTER — Emergency Department (HOSPITAL_COMMUNITY)
Admission: EM | Admit: 2014-07-03 | Discharge: 2014-07-03 | Disposition: A | Discharge status: Home / Self Care | Payer: Self-pay | Attending: Emergency Medicine | Admitting: Emergency Medicine

## 2014-07-03 DIAGNOSIS — Z872 Personal history of diseases of the skin and subcutaneous tissue: Secondary | ICD-10-CM | POA: Insufficient documentation

## 2014-07-03 DIAGNOSIS — R945 Abnormal results of liver function studies: Secondary | ICD-10-CM | POA: Insufficient documentation

## 2014-07-03 DIAGNOSIS — F1410 Cocaine abuse, uncomplicated: Secondary | ICD-10-CM | POA: Insufficient documentation

## 2014-07-03 DIAGNOSIS — F1910 Other psychoactive substance abuse, uncomplicated: Secondary | ICD-10-CM

## 2014-07-03 DIAGNOSIS — F1310 Sedative, hypnotic or anxiolytic abuse, uncomplicated: Secondary | ICD-10-CM | POA: Insufficient documentation

## 2014-07-03 DIAGNOSIS — E876 Hypokalemia: Secondary | ICD-10-CM | POA: Insufficient documentation

## 2014-07-03 DIAGNOSIS — Z79899 Other long term (current) drug therapy: Secondary | ICD-10-CM | POA: Insufficient documentation

## 2014-07-03 DIAGNOSIS — R7989 Other specified abnormal findings of blood chemistry: Secondary | ICD-10-CM

## 2014-07-03 DIAGNOSIS — Z720 Tobacco use: Secondary | ICD-10-CM | POA: Insufficient documentation

## 2014-07-03 LAB — ETHANOL: Alcohol, Ethyl (B): <5 mg/dL (ref 0–9)

## 2014-07-03 LAB — CBC WITH DIFFERENTIAL/PLATELET
Basophils Absolute: 0 10*3/uL (ref 0.0–0.1)
Basophils Relative: 0 % (ref 0–1)
Eosinophils Absolute: 0 10*3/uL (ref 0.0–0.7)
Eosinophils Relative: 0 % (ref 0–5)
HCT: 41.4 % (ref 36.0–46.0)
Hemoglobin: 14.3 g/dL (ref 12.0–15.0)
Lymphocytes Relative: 12 % (ref 12–46)
Lymphs Abs: 1 10*3/uL (ref 0.7–4.0)
MCH: 30.6 pg (ref 26.0–34.0)
MCHC: 34.5 g/dL (ref 30.0–36.0)
MCV: 88.7 fL (ref 78.0–100.0)
MONO ABS: 0.6 10*3/uL (ref 0.1–1.0)
Monocytes Relative: 7 % (ref 3–12)
NEUTROS ABS: 6.5 10*3/uL (ref 1.7–7.7)
NEUTROS PCT: 81 % — AB (ref 43–77)
Platelets: 325 10*3/uL (ref 150–400)
RBC: 4.67 MIL/uL (ref 3.87–5.11)
RDW: 13.4 % (ref 11.5–15.5)
WBC: 8.1 10*3/uL (ref 4.0–10.5)

## 2014-07-03 LAB — RAPID URINE DRUG SCREEN, HOSP PERFORMED
Amphetamines: NOT DETECTED
BARBITURATES: NOT DETECTED
Benzodiazepines: POSITIVE — AB
COCAINE: POSITIVE — AB
Opiates: NOT DETECTED
TETRAHYDROCANNABINOL: NOT DETECTED

## 2014-07-03 LAB — COMPREHENSIVE METABOLIC PANEL
ALT: 100 U/L — ABNORMAL HIGH (ref 0–35)
ANION GAP: 7 (ref 5–15)
AST: 114 U/L — AB (ref 0–37)
Albumin: 3.9 g/dL (ref 3.5–5.2)
Alkaline Phosphatase: 87 U/L (ref 39–117)
BUN: 9 mg/dL (ref 6–23)
CHLORIDE: 92 meq/L — AB (ref 96–112)
CO2: 32 mmol/L (ref 19–32)
CREATININE: 0.7 mg/dL (ref 0.50–1.10)
Calcium: 9 mg/dL (ref 8.4–10.5)
GFR calc Af Amer: >90 mL/min (ref >90)
GFR calc non Af Amer: >90 mL/min (ref >90)
Glucose, Bld: 121 mg/dL — ABNORMAL HIGH (ref 70–99)
POTASSIUM: 2.9 mmol/L — AB (ref 3.5–5.1)
Sodium: 131 mmol/L — ABNORMAL LOW (ref 135–145)
Total Bilirubin: 0.9 mg/dL (ref 0.3–1.2)
Total Protein: 8.8 g/dL — ABNORMAL HIGH (ref 6.0–8.3)

## 2014-07-03 LAB — ACETAMINOPHEN LEVEL: Acetaminophen (Tylenol), Serum: <10 ug/mL — ABNORMAL LOW (ref 10–30)

## 2014-07-03 LAB — SALICYLATE LEVEL

## 2014-07-03 MED ORDER — POTASSIUM CHLORIDE CRYS ER 20 MEQ PO TBCR: 20.0000 meq | EXTENDED_RELEASE_TABLET | Freq: Every day | ORAL | Status: DC

## 2014-07-03 MED ORDER — CLONAZEPAM 1 MG PO TABS: 1.0000 mg | ORAL_TABLET | Freq: Three times a day (TID) | ORAL | Status: DC | PRN

## 2014-07-03 MED ORDER — POTASSIUM CHLORIDE CRYS ER 20 MEQ PO TBCR
40.0000 meq | EXTENDED_RELEASE_TABLET | Freq: Once | ORAL | Status: AC
  Administered 2014-07-03: 40 meq via ORAL
  Filled 2014-07-03: qty 2

## 2014-07-03 MED ORDER — LORAZEPAM 1 MG PO TABS
1.0000 mg | ORAL_TABLET | Freq: Once | ORAL | Status: AC
  Administered 2014-07-03: 1 mg via ORAL
  Filled 2014-07-03: qty 1

## 2014-07-03 NOTE — ED Notes (Signed)
Pt states she has been using for years.  Methadone, benzo alcohol, cocaine.  Pt wants detox.  Last use was  Yesterday.  Previous detox programs but not here.

## 2014-07-03 NOTE — Discharge Instructions (Signed)
Take klonopin as needed for withdrawal symptoms.   Stop drinking alcohol and drugs.   Take potassium pills.   Recheck potassium level and liver enzyme in a week with your doctor.   Call detox centers.   Return to ER if you have withdrawals, seizures, thoughts of suicide.    Emergency Department Resource Guide 1) Find a Doctor and Pay Out of Pocket Although you won't have to find out who is covered by your insurance plan, it is a good idea to ask around and get recommendations. You will then need to call the office and see if the doctor you have chosen will accept you as a new patient and what types of options they offer for patients who are self-pay. Some doctors offer discounts or will set up payment plans for their patients who do not have insurance, but you will need to ask so you aren't surprised when you get to your appointment.  2) Contact Your Local Health Department Not all health departments have doctors that can see patients for sick visits, but many do, so it is worth a call to see if yours does. If you don't know where your local health department is, you can check in your phone book. The CDC also has a tool to help you locate your state's health department, and many state websites also have listings of all of their local health departments.  3) Find a Walk-in Clinic If your illness is not likely to be very severe or complicated, you may want to try a walk in clinic. These are popping up all over the country in pharmacies, drugstores, and shopping centers. They're usually staffed by nurse practitioners or physician assistants that have been trained to treat common illnesses and complaints. They're usually fairly quick and inexpensive. However, if you have serious medical issues or chronic medical problems, these are probably not your best option.  No Primary Care Doctor: - Call Health Connect at  [phone] - they can help you locate a primary care doctor that  accepts your  insurance, provides certain services, etc. - Physician Referral Service- [phone]  Chronic Pain Problems: Organization         Address  Phone   Notes  [facility]  [phone] Patients need to be referred by their primary care doctor.   Medication Assistance: Organization         Address  Phone   Notes  [facility] [address] [place], [place] [address] [phone] --Must be a resident of [place] -- Must have NO insurance coverage whatsoever (no Medicaid/ Medicare, etc.) -- The pt. MUST have a primary care doctor that directs their care regularly and follows them in the community   Med[facility]ist  [phone]   [employer]  [phone]    Agencies that provide inexpensive medical care: Organization         Address  Phone   Notes  [facility]  [phone]   [facility]    [phone]   [facility] [address] [place], [place] [address] [phone]) [phone]   [facility] [address], [place] [phone]   [facility]    [phone](336) [phone]   [facility]    [phone]) [phone]   [facility]  [address], [place] Phone:  [phone], Fax:  [phone] Hours of Operation:  9 am - 6 pm, M-F.  Also accepts Medicaid/Medicare and self-pay.  °[facility] ° [address], [place] Phone: [phone], Fax: [phone]. Hours of Operation:  8:30 am - 5:30 pm, M-F.  Also accepts Medicaid and self-pay.  °[facility] [place] [address], [place] Phone: [phone]) [phone]   °[facility] [address], [place], [place] ([phone], Ext. 123 Mondays & Thursdays: 7-9 AM.  First 15 patients are seen on a first come, first serve basis. °  ° °Medicaid-accepting [place] Providers: ° °Organization          Address  Phone   Notes  °[facility] [address], [place] [phone] Also accepts self-pay patients.  °[facility] [address], [place] ° ([phone]) [phone]   °[facility] [address], [place] [phone]) [phone]   °[facility] [address] [place] [address], [place] ([phone]) [phone]   °Veita Bland [address], [place]  ° [phone]) [phone] Only accepts [place] Access Medicaid patients after they have their name applied to their card.  ° °Self-Pay (no insurance) in [place]: ° °Organization         Address  Phone   Notes  °Sickle Cell Patients, [facility] [address], [place] ([phone]) [phone]   °[facility] [address], [place] ([phone]) [phone]   °[facility] [place] ° [address], [place] [phone]) [phone]   °[facility]/Dr. Osei-Bonsu ° [address] [place] [address], [place] or [address], [place] [phone]) [phone] Phone number for both [place] and [place] locations is the same.  °[facility] [address], [place] ([phone]) [phone]   °[facility] [address] [place] [address], [place] or [address] ([phone]) [phone] °(336) [phone]   °[facility] [address], [place] ([phone]) [phone], phone; ([phone]) [phone], fax Sees patients 1st and 3rd Saturday of every month.  Must not qualify for public or private insurance (i.e. Medicaid, Medicare, [place] Health Choice, Veterans' Benefits) • Household income should be no more than 200% of the poverty level •The clinic cannot treat you if you are pregnant or think you are pregnant • Sexually transmitted diseases are not treated at the clinic.  ° ° °Dental Care: °Organization         Address  Phone  Notes  °[place] [facility] [address],  [place] ([phone]) [phone] Accepts children up to age 21 who are enrolled in Medicaid or [place] Health Choice; pregnant women with a Medicaid card; and children who have applied for Medicaid or [place] Health Choice, but were declined, whose parents can pay a reduced fee at time of service.  °[place] [facility] [place]  [address], [place] ([phone]) [phone] Accepts children up to age 21 who are enrolled in Medicaid or [place] Health Choice; pregnant women with a Medicaid card; and children who have applied for Medicaid or  Health Choice, but were declined, whose parents can pay a reduced fee at time of service.  °[facility] ° [address], [place] ([phone]) [phone] Patients are seen by appointment only. Walk-ins are not accepted. [facility] will see patients 18 years   of age and older. °Monday - Tuesday (8am-5pm) °Most Wednesdays (8:30-5pm) °$30 per visit, cash only  °[facility] ° [address], [place] [phone]) [phone] Patients are seen by appointment only. Walk-ins are not accepted. [facility] will see patients 18 years of age and older. °One Wednesday Evening (Monthly: Volunteer Based).  $30 per visit, cash only  °[facility]  ([phone] for adults; Children under age 4, call Graduate Pediatric Dentistry at [phone]. Children aged 4-14, please call [phone] to request a pediatric application. ° Dental services are provided in all areas of dental care including fillings, crowns and bridges, complete and partial dentures, implants, gum treatment, root canals, and extractions. Preventive care is also provided. Treatment is provided to both adults and children. °Patients are selected via a lottery and there is often a waiting list. °  °[facility] [address], °[place] ° ([phone]) [phone] www.drcivils.com °  °[facility] [address], [place], [place]  (336)[phone], Ext. 123 Second and Fourth Thursday of each month, opens at 6:30 AM; Clinic ends at 9 AM.  Patients are seen on a first-come first-served basis, and a limited number are seen during each clinic.  ° °[facility] ° [address], [place], [place] ([phone]) [phone]   Eligibility Requirements °You must have lived in [place], [place], or [place] counties for at least the last three months. °  You cannot be eligible for state or federal sponsored healthcare insurance, including Veterans Administration, Medicaid, or Medicare. °  You generally cannot be eligible for healthcare insurance through your employer.  °  How to apply: °Eligibility screenings are held every Tuesday and Wednesday afternoon from 1:00 pm until 4:00 pm. You do not need an appointment for the interview!  °[facility] [address] [place] [address], [place], [place] [phone]   °[facility]  [phone]   °[facility]  336-703-3100   °[facility]  336-570-6415   ° °Behavioral Health Resources in the Community: °Intensive Outpatient Programs °Organization         Address  Phone  Notes  °[facility] [address], [place], [place] [phone]   °[facility] [address], [place], [place] [phone]   °[facility] [address], [place], [place] ° 336-882-2125   °[facility] [address],  °[place], [place] 1-800-853-5163 or 336-641-4981   °Substance Abuse Resources °Organization         Address  Phone  Notes  °[facility]  336-882-2125   °[facility]  336-784-9470   °[facility]  336-285-9073   °[facility]  336-845-3988   °Residential & Outpatient Substance Abuse Program  1-800-659-3381   °Psychological Services °Organization         Address  Phone  Notes  °[facility]  336- [phone]   °[facility]  336- 378-7881    °[facility] [address], [place] 1-800-853-5163 or 336-641-4981   ° °Mobile Crisis Teams °Organization         Address  Phone  Notes  °[facility], Mobile Crisis Care Unit  1-877-626-1772   °Assertive °[facility] ° [address] [place], [place] 336-834-9664   °Sharon DeEsch [address] °[place]  336-554-5454   ° °Self-Help/Support Groups °Organization           Address  Phone             Notes  [place] [facility] [place] - variety of support groups  [place] Call for more information  Narcotics Anonymous (NA), [facility] [address], [employer] [place]  2 meetings at this location   Special educational needs teacher         Address  Phone  Notes  [facility] [place],    [place]  [phone])[phone]   [facility]  [address] [place] [address], [place], [place]   [place], [place] [phone] Admissions: 8am-3pm M-F  Incentives Substance [place] [address],    [place], [place] [phone]   [facility] [address] [place], [place], [place]   [facility] [address]  [place], [place]   [facility] - Intensive Outpatient [address], [place], [place]   [facility] ([place].,  [place], [place] 1-(574)711-1611 or [phone]   [facility] ([facility]) [address] [place], [place] Accepts Medicaid  [facility] [place] [address]  [place] [phone] Substance Abuse/Addiction Treatment   [facility] Organization         Address  Phone  Notes  [facility]  [phone]   Domenic Schwab, PhD [address] [place], [place]   [phone] or [phone]   [place], [place] [phone]   [facility] [address] [place], [place] [phone] Insurance/Medicaid/sponsorship through [facility] [address]                                    [place], [place] [phone] [place] [address], [place] [phone]    Dr. Adele Schilder  [phone]   [facility]. 1) [address], [place] 2) [place] 3)  [address], [place] [phone]  [phone]   [place] [phone] or [phone] (After Hours)

## 2014-07-03 NOTE — Progress Notes (Signed)
  CARE MANAGEMENT ED NOTE 07/03/2014  Patient:  Alyssa Kelley,Alyssa Kelley   Account Number:  [id number]  Date Initiated:  07/03/2014  Documentation initiated by:  Edd ArbourGIBBS,KIMBERLY  Subjective/Objective Assessment:   44 yr old self [place] pt she has been using for years.  Methadone, benzo alcohol, cocaine.  Pt wants detox.  Last use was  Yesterday.  Previous detox programs but not here.                     Subjective/Objective Assessment Detail:   As soon as Cm entered her triage room she stated "I can't go back out there.  Tell the doctor I need to speak to him again because I am now suicidal cause I can't go back out there." After CM assessment EDP and ED RN informed  pt confirms no pcp nor insurance  Pt informed Cm she did not f/u after [facility] ED CPM CM saw her in July 2015 because " I was out there doing drugs still"  Pt recalls previous MATCH assistance.  Reports she "has a place to stay",  lives at [address] [place] [address] and updated her home number from [phone] to [phone] States [phone] is her cell number     Action/Plan:   Consulted by EDP Yao to see pt for med assist but pt not eligible she is no longer homeless and received MATCH assist in July 2015 [facility] CM Pt informed she is not eligible for [facility] assistance and stated "Okay" CM provided pt with   Action/Plan Detail:   klor 20 meq -2 tabs for $2.16 at [facility] with free coupon & klonopin 1 mg 8 tabs for $3.19 with free coupon at [facility] Pt was given coupons for the discounts   Anticipated DC Date:  07/03/2014     Status Recommendation to Physician:   Result of Recommendation:    Other ED Services  Consult Working Plan    DC Planning Services  Other  Outpatient Services - Pt will follow up  PCP issues  GCCN / P4HM (established/new)    Choice offered to / List presented to:            Status of service:  Completed, signed off  ED Comments:   ED Comments Detail:  CM spoke with pt who  confirms self pay [facility] with no pcp. CM discussed and provided written information for self pay pcps, importance of pcp for f/u care, www.needymeds.org, www.goodrx.com, discounted pharmacies and other [place] resources such as [facility], [facility], affordable care act,  financial assistance, DSS and  health department  Reviewed resources for [place] self pay pcps like [facility], family medicine at [address], [facility], general medical clinics, [facility] urgent care plus others, medication resources, [facility] out patient pharmacies and housing Pt voiced understanding and appreciation of resources provided  Provided [facility] contact information Agreed to referral to [facility] and helped updated her number to be reached at for services Referral completed

## 2014-07-03 NOTE — ED Notes (Signed)
Pt spoke to case manager regarding meds and ability for discount options

## 2014-07-03 NOTE — ED Notes (Signed)
MD at bedside. 

## 2014-07-03 NOTE — ED Provider Notes (Signed)
CSN: [id number]     Arrival date & time 07/03/14  [id number] History   First MD Initiated Contact with Patient 07/03/14 [phone]     Chief Complaint  Patient presents with  . Drug Problem     (Consider location/radiation/quality/duration/timing/severity/associated sxs/prior Treatment) The history is provided by the patient.  Alyssa Kelley is a 44 y.o. 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 She drinks around 12 cans of beer daily and last week was last night. Currently on methadone for heroin use before. Also use cocaine occasionally. Has been using Xanax about 6200 pills daily. Has previous detox before. Denies suicidal homicidal ideation. Denies chest pain or shortness of breath.    Past Medical History  Diagnosis Date  . Abscess of mouth   . Heroin abuse    History reviewed. No pertinent past surgical history. History reviewed. No pertinent family history. History  Substance Use Topics  . Smoking status: Current Every Day Smoker    Types: Cigarettes  . Smokeless tobacco: Not on file  . Alcohol Use: Yes   OB History    No data available     Review of Systems  Psychiatric/Behavioral: Negative for suicidal ideas, hallucinations and self-injury.  All other systems reviewed and are negative.     Allergies  Review of patient's allergies indicates no known allergies.  Home Medications   Prior to Admission medications   Medication Sig Start Date End Date Taking? Authorizing Provider  METHADONE HCL PO Take 100 mg by mouth daily.   Yes Historical Provider, MD  amoxicillin-clavulanate (AUGMENTIN) 875-125 MG per tablet Take 1 tablet by mouth 2 (two) times daily. Patient not taking: Reported on 05/12/2014 12/23/13   Antony MaduraKelly Humes, PA-C  dextromethorphan-guaiFENesin [facility](MUCINEX DM) 30-600 MG per 12 hr tablet Take 1 tablet by mouth 2 (two) times daily. Patient not taking: Reported on 07/03/2014 05/12/14   Earle GellBenjamin W  Cartner, PA-C   BP 113/81 mmHg  Pulse 98  Temp(Src) 97.8 F (36.6 C) (Oral)  Resp 18  SpO2 100%  LMP 07/02/2014 Physical Exam  Constitutional: She is oriented to person, place, and time. She appears well-nourished.  HENT:  Head: Normocephalic.  Mouth/Throat: Oropharynx is clear and moist.  Eyes: Conjunctivae and EOM are normal. Pupils are equal, round, and reactive to light.  Neck: Normal range of motion. Neck supple.  Cardiovascular: Normal rate, regular rhythm and normal heart sounds.   Pulmonary/Chest: Effort normal and breath sounds normal. No respiratory distress. She has no wheezes. She has no rales.  Abdominal: Soft. Bowel sounds are normal. She exhibits no distension. There is no tenderness. There is no rebound.  Musculoskeletal: Normal range of motion. She exhibits no edema or tenderness.  Neurological: She is alert and oriented to person, place, and time. No cranial nerve deficit. Coordination normal.  Mild tremors. Nl strength throughout   Skin: Skin is warm and dry.  Psychiatric: She has a normal mood and affect. Her behavior is normal. Judgment and thought content normal.  Nursing note and vitals reviewed.   ED Course  Procedures (including critical care time) Labs Review Labs Reviewed  CBC WITH DIFFERENTIAL - Abnormal; Notable for the following:    Neutrophils Relative % 81 (*)    All other components within normal limits  COMPREHENSIVE METABOLIC PANEL - Abnormal; Notable for the following:    Sodium 131 (*)    Potassium 2.9 (*)    Chloride 92 (*)  Glucose, Bld 121 (*)    Total Protein 8.8 (*)    AST 114 (*)    ALT 100 (*)    All other components within normal limits  URINE RAPID DRUG SCREEN (HOSP PERFORMED) - Abnormal; Notable for the following:    Cocaine POSITIVE (*)    Benzodiazepines POSITIVE (*)    All other components within normal limits  ACETAMINOPHEN LEVEL - Abnormal; Notable for the following:    Acetaminophen (Tylenol), Serum <10.0 (*)     All other components within normal limits  ETHANOL  SALICYLATE LEVEL    Imaging Review No results found.   EKG Interpretation None      MDM   Final diagnoses:  None   Alyssa Kelley is a 44 y.o. female here with polysubstance abuse. Given significant benzo and alcohol use, will consider getting placement for detox. Will get psych clearance labs.   12:13 PM CIWA 3. Not tachy currently. Just has some tremors. K 2.9 likely from dehydration. LFTs slightly elevated but she has known hep C and has no abdominal pain. Unfortunately [facility]. Given low CIWA, will dc with short course of klonopin as needed for withdrawal symptoms. Will give some potassium to go home as well. Recommend repeat CMP in a week.     Richardean Canal, MD 07/03/14 1215

## 2014-11-11 ENCOUNTER — Emergency Department (HOSPITAL_COMMUNITY)
Admission: EM | Admit: 2014-11-11 | Discharge: 2014-11-12 | Disposition: A | Discharge status: Other Acute Inpatient Hospital | Payer: Federal, State, Local not specified - Other

## 2014-11-11 ENCOUNTER — Encounter (HOSPITAL_COMMUNITY): Payer: Self-pay | Admitting: Emergency Medicine

## 2014-11-11 DIAGNOSIS — F1110 Opioid abuse, uncomplicated: Secondary | ICD-10-CM | POA: Insufficient documentation

## 2014-11-11 DIAGNOSIS — Z3202 Encounter for pregnancy test, result negative: Secondary | ICD-10-CM | POA: Insufficient documentation

## 2014-11-11 DIAGNOSIS — Y9389 Activity, other specified: Secondary | ICD-10-CM | POA: Insufficient documentation

## 2014-11-11 DIAGNOSIS — F1310 Sedative, hypnotic or anxiolytic abuse, uncomplicated: Secondary | ICD-10-CM | POA: Insufficient documentation

## 2014-11-11 DIAGNOSIS — S00511A Abrasion of lip, initial encounter: Secondary | ICD-10-CM | POA: Insufficient documentation

## 2014-11-11 DIAGNOSIS — S5011XA Contusion of right forearm, initial encounter: Secondary | ICD-10-CM | POA: Insufficient documentation

## 2014-11-11 DIAGNOSIS — Y998 Other external cause status: Secondary | ICD-10-CM | POA: Insufficient documentation

## 2014-11-11 DIAGNOSIS — Z720 Tobacco use: Secondary | ICD-10-CM | POA: Insufficient documentation

## 2014-11-11 DIAGNOSIS — Z8719 Personal history of other diseases of the digestive system: Secondary | ICD-10-CM | POA: Insufficient documentation

## 2014-11-11 DIAGNOSIS — Y9289 Other specified places as the place of occurrence of the external cause: Secondary | ICD-10-CM | POA: Insufficient documentation

## 2014-11-11 DIAGNOSIS — Z79899 Other long term (current) drug therapy: Secondary | ICD-10-CM | POA: Insufficient documentation

## 2014-11-11 DIAGNOSIS — S0083XA Contusion of other part of head, initial encounter: Secondary | ICD-10-CM | POA: Insufficient documentation

## 2014-11-11 DIAGNOSIS — F332 Major depressive disorder, recurrent severe without psychotic features: Secondary | ICD-10-CM | POA: Diagnosis present

## 2014-11-11 DIAGNOSIS — R45851 Suicidal ideations: Secondary | ICD-10-CM

## 2014-11-11 DIAGNOSIS — F1010 Alcohol abuse, uncomplicated: Secondary | ICD-10-CM | POA: Insufficient documentation

## 2014-11-11 DIAGNOSIS — F1410 Cocaine abuse, uncomplicated: Secondary | ICD-10-CM | POA: Insufficient documentation

## 2014-11-11 DIAGNOSIS — F419 Anxiety disorder, unspecified: Secondary | ICD-10-CM | POA: Insufficient documentation

## 2014-11-11 DIAGNOSIS — S5012XA Contusion of left forearm, initial encounter: Secondary | ICD-10-CM | POA: Insufficient documentation

## 2014-11-11 LAB — URINE MICROSCOPIC-ADD ON

## 2014-11-11 LAB — URINALYSIS, ROUTINE W REFLEX MICROSCOPIC
Bilirubin Urine: NEGATIVE
Glucose, UA: NEGATIVE mg/dL
KETONES UR: NEGATIVE mg/dL
Leukocytes, UA: NEGATIVE
NITRITE: NEGATIVE
PH: 5 (ref 5.0–8.0)
PROTEIN: NEGATIVE mg/dL
Specific Gravity, Urine: 1.01 (ref 1.005–1.030)
Urobilinogen, UA: 0.2 mg/dL (ref 0.0–1.0)

## 2014-11-11 LAB — RAPID URINE DRUG SCREEN, HOSP PERFORMED
Amphetamines: NOT DETECTED
BARBITURATES: NOT DETECTED
BENZODIAZEPINES: POSITIVE — AB
COCAINE: POSITIVE — AB
OPIATES: NOT DETECTED
Tetrahydrocannabinol: NOT DETECTED

## 2014-11-11 LAB — CBC
HCT: 37.4 % (ref 36.0–46.0)
Hemoglobin: 12.8 g/dL (ref 12.0–15.0)
MCH: 30.7 pg (ref 26.0–34.0)
MCHC: 34.2 g/dL (ref 30.0–36.0)
MCV: 89.7 fL (ref 78.0–100.0)
Platelets: 302 10*3/uL (ref 150–400)
RBC: 4.17 MIL/uL (ref 3.87–5.11)
RDW: 13.6 % (ref 11.5–15.5)
WBC: 9.5 10*3/uL (ref 4.0–10.5)

## 2014-11-11 LAB — COMPREHENSIVE METABOLIC PANEL
ALBUMIN: 3.7 g/dL (ref 3.5–5.0)
ALT: 62 U/L — AB (ref 14–54)
AST: 85 U/L — AB (ref 15–41)
Alkaline Phosphatase: 60 U/L (ref 38–126)
Anion gap: 13 (ref 5–15)
BUN: 10 mg/dL (ref 6–20)
CHLORIDE: 98 mmol/L — AB (ref 101–111)
CO2: 27 mmol/L (ref 22–32)
Calcium: 9.2 mg/dL (ref 8.9–10.3)
Creatinine, Ser: 0.7 mg/dL (ref 0.44–1.00)
GFR calc Af Amer: >60 mL/min (ref >60)
GFR calc non Af Amer: >60 mL/min (ref >60)
Glucose, Bld: 86 mg/dL (ref 65–99)
POTASSIUM: 3 mmol/L — AB (ref 3.5–5.1)
Sodium: 138 mmol/L (ref 135–145)
TOTAL PROTEIN: 8 g/dL (ref 6.5–8.1)
Total Bilirubin: 0.3 mg/dL (ref 0.3–1.2)

## 2014-11-11 LAB — ETHANOL

## 2014-11-11 LAB — PREGNANCY, URINE: Preg Test, Ur: NEGATIVE

## 2014-11-11 MED ORDER — LORAZEPAM 1 MG PO TABS
0.0000 mg | ORAL_TABLET | Freq: Two times a day (BID) | ORAL | Status: DC
  Filled 2014-11-11: qty 1

## 2014-11-11 MED ORDER — VITAMIN B-1 100 MG PO TABS
100.0000 mg | ORAL_TABLET | Freq: Every day | ORAL | Status: DC
  Administered 2014-11-12: 100 mg via ORAL

## 2014-11-11 MED ORDER — THIAMINE HCL 100 MG/ML IJ SOLN: 100.0000 mg | Freq: Every day | INTRAMUSCULAR | Status: DC

## 2014-11-11 MED ORDER — ONDANSETRON HCL 4 MG PO TABS: 4.0000 mg | ORAL_TABLET | Freq: Three times a day (TID) | ORAL | Status: DC | PRN

## 2014-11-11 MED ORDER — PAROXETINE HCL 20 MG PO TABS
20.0000 mg | ORAL_TABLET | Freq: Every day | ORAL | Status: DC
  Administered 2014-11-11 – 2014-11-12 (×2): 20 mg via ORAL
  Filled 2014-11-11 (×2): qty 1

## 2014-11-11 MED ORDER — LORAZEPAM 1 MG PO TABS
0.0000 mg | ORAL_TABLET | Freq: Four times a day (QID) | ORAL | Status: DC
  Administered 2014-11-11: 1 mg via ORAL

## 2014-11-11 MED ORDER — IBUPROFEN 200 MG PO TABS
600.0000 mg | ORAL_TABLET | Freq: Three times a day (TID) | ORAL | Status: DC | PRN
  Administered 2014-11-12: 600 mg via ORAL
  Filled 2014-11-11: qty 3

## 2014-11-11 MED ORDER — LORAZEPAM 2 MG/ML IJ SOLN: 0.0000 mg | Freq: Two times a day (BID) | INTRAMUSCULAR | Status: DC

## 2014-11-11 MED ORDER — CLONIDINE HCL 0.1 MG PO TABS
0.1000 mg | ORAL_TABLET | Freq: Two times a day (BID) | ORAL | Status: DC
  Administered 2014-11-11 – 2014-11-12 (×2): 0.1 mg via ORAL
  Filled 2014-11-11 (×2): qty 1

## 2014-11-11 MED ORDER — VITAMIN B-1 100 MG PO TABS
100.0000 mg | ORAL_TABLET | Freq: Every day | ORAL | Status: DC
  Administered 2014-11-11: 100 mg via ORAL
  Filled 2014-11-11: qty 1

## 2014-11-11 MED ORDER — POTASSIUM CHLORIDE CRYS ER 20 MEQ PO TBCR
40.0000 meq | EXTENDED_RELEASE_TABLET | Freq: Once | ORAL | Status: AC
  Administered 2014-11-11: 40 meq via ORAL
  Filled 2014-11-11: qty 2

## 2014-11-11 MED ORDER — ALUM & MAG HYDROXIDE-SIMETH 200-200-20 MG/5ML PO SUSP: 30.0000 mL | ORAL | Status: DC | PRN

## 2014-11-11 MED ORDER — POTASSIUM CHLORIDE CRYS ER 20 MEQ PO TBCR
20.0000 meq | EXTENDED_RELEASE_TABLET | Freq: Every day | ORAL | Status: DC
  Administered 2014-11-11 – 2014-11-12 (×2): 20 meq via ORAL
  Filled 2014-11-11 (×2): qty 1

## 2014-11-11 MED ORDER — PROPRANOLOL HCL 20 MG PO TABS
20.0000 mg | ORAL_TABLET | Freq: Three times a day (TID) | ORAL | Status: DC
  Administered 2014-11-11 – 2014-11-12 (×2): 20 mg via ORAL
  Filled 2014-11-11 (×4): qty 1

## 2014-11-11 MED ORDER — VITAMIN B-1 100 MG PO TABS
100.0000 mg | ORAL_TABLET | Freq: Every day | ORAL | Status: DC
  Administered 2014-11-11 – 2014-11-12 (×2): 100 mg via ORAL
  Filled 2014-11-11 (×2): qty 1

## 2014-11-11 MED ORDER — LORAZEPAM 2 MG/ML IJ SOLN: 0.0000 mg | Freq: Four times a day (QID) | INTRAMUSCULAR | Status: DC

## 2014-11-11 MED ORDER — LORAZEPAM 1 MG PO TABS: 1.0000 mg | ORAL_TABLET | Freq: Three times a day (TID) | ORAL | Status: DC | PRN

## 2014-11-11 NOTE — ED Notes (Signed)
Patient states she takes "5-6" benzos per day that she gets "off the streets". Detox protocol initiated.

## 2014-11-11 NOTE — ED Notes (Signed)
Pt belongings wanded by security.  
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
## 2014-11-11 NOTE — ED Notes (Signed)
Pt changed to wine scrubs, belongings collected.  Security made aware of need for wanding.

## 2014-11-11 NOTE — [facility] Counselor (Signed)
Per Catha NottinghamJamison, DNP - patient will be reassessed in the morning for a final disposition.
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
## 2014-11-11 NOTE — ED Notes (Signed)
Bed: WTR5 Expected date:  Expected time:  Means of arrival:  Comments: EMS-detox

## 2014-11-11 NOTE — ED Notes (Signed)
Per EDPA, pt reports she is suicidal now that she knows she cannot get detox here.

## 2014-11-11 NOTE — ED Notes (Signed)
Ambulatory with steady gait to void in BR 

## 2014-11-11 NOTE — Progress Notes (Signed)
CM spoke with pt who confirms self pay [place] resident with no pcp.  CM discussed and provided written information for self pay pcps, discussed the importance of pcp vs EDP services for f/u care, www.needymeds.org, www.goodrx.com, discounted pharmacies and other [place] resources such as [facility] , [facility], affordable care act,  [place] med assist, financial assistance, self pay dental services, [place] med assist, DSS and  health department  Reviewed resources for [place] self pay pcps like [facility], [facility] [address], [facility], [facility], local urgent care centers, [facility], [facility], general [facility] clinics, [facility], [facility] plus others, medication resources, [facility] out patient pharmacies and housing Pt voiced understanding and appreciation of resources provided   Provided [facility] contact information Pt agreed to a referral Cm completed referral Pt to be contact by [facility] clinical liason 
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
## 2014-11-12 ENCOUNTER — Inpatient Hospital Stay (HOSPITAL_COMMUNITY)
Admission: AD | Admit: 2014-11-12 | Discharge: 2014-11-22 | DRG: 885 | Disposition: A | Discharge status: Home / Self Care | Payer: No Typology Code available for payment source | Source: Intra-hospital | Attending: Psychiatry | Admitting: Psychiatry

## 2014-11-12 ENCOUNTER — Encounter (HOSPITAL_COMMUNITY): Payer: Self-pay | Admitting: *Deleted

## 2014-11-12 DIAGNOSIS — F1410 Cocaine abuse, uncomplicated: Secondary | ICD-10-CM | POA: Diagnosis not present

## 2014-11-12 DIAGNOSIS — F1010 Alcohol abuse, uncomplicated: Secondary | ICD-10-CM | POA: Diagnosis not present

## 2014-11-12 DIAGNOSIS — I10 Essential (primary) hypertension: Secondary | ICD-10-CM | POA: Diagnosis present

## 2014-11-12 DIAGNOSIS — F1390 Sedative, hypnotic, or anxiolytic use, unspecified, uncomplicated: Secondary | ICD-10-CM

## 2014-11-12 DIAGNOSIS — F1020 Alcohol dependence, uncomplicated: Secondary | ICD-10-CM | POA: Diagnosis present

## 2014-11-12 DIAGNOSIS — F332 Major depressive disorder, recurrent severe without psychotic features: Principal | ICD-10-CM | POA: Diagnosis present

## 2014-11-12 DIAGNOSIS — Z9119 Patient's noncompliance with other medical treatment and regimen: Secondary | ICD-10-CM | POA: Diagnosis present

## 2014-11-12 DIAGNOSIS — F10239 Alcohol dependence with withdrawal, unspecified: Secondary | ICD-10-CM | POA: Diagnosis present

## 2014-11-12 DIAGNOSIS — F419 Anxiety disorder, unspecified: Secondary | ICD-10-CM | POA: Diagnosis not present

## 2014-11-12 DIAGNOSIS — R45851 Suicidal ideations: Secondary | ICD-10-CM | POA: Diagnosis present

## 2014-11-12 DIAGNOSIS — F1920 Other psychoactive substance dependence, uncomplicated: Secondary | ICD-10-CM | POA: Diagnosis not present

## 2014-11-12 DIAGNOSIS — F515 Nightmare disorder: Secondary | ICD-10-CM | POA: Diagnosis not present

## 2014-11-12 DIAGNOSIS — F4310 Post-traumatic stress disorder, unspecified: Secondary | ICD-10-CM | POA: Diagnosis present

## 2014-11-12 DIAGNOSIS — F17210 Nicotine dependence, cigarettes, uncomplicated: Secondary | ICD-10-CM | POA: Diagnosis present

## 2014-11-12 DIAGNOSIS — F1099 Alcohol use, unspecified with unspecified alcohol-induced disorder: Secondary | ICD-10-CM

## 2014-11-12 DIAGNOSIS — F39 Unspecified mood [affective] disorder: Secondary | ICD-10-CM | POA: Diagnosis not present

## 2014-11-12 DIAGNOSIS — F329 Major depressive disorder, single episode, unspecified: Secondary | ICD-10-CM | POA: Diagnosis present

## 2014-11-12 DIAGNOSIS — G4700 Insomnia, unspecified: Secondary | ICD-10-CM | POA: Diagnosis not present

## 2014-11-12 HISTORY — DX: Essential (primary) hypertension: I10

## 2014-11-12 HISTORY — DX: Depression, unspecified: F32.A

## 2014-11-12 HISTORY — DX: Major depressive disorder, single episode, unspecified: F32.9

## 2014-11-12 HISTORY — DX: Anxiety disorder, unspecified: F41.9

## 2014-11-12 MED ORDER — THIAMINE HCL 100 MG/ML IJ SOLN: 100.0000 mg | Freq: Every day | INTRAMUSCULAR | Status: DC

## 2014-11-12 MED ORDER — VITAMIN B-1 100 MG PO TABS
100.0000 mg | ORAL_TABLET | Freq: Every day | ORAL | Status: DC
  Filled 2014-11-12: qty 1

## 2014-11-12 MED ORDER — LORAZEPAM 2 MG/ML IJ SOLN: 0.0000 mg | Freq: Two times a day (BID) | INTRAMUSCULAR | Status: AC

## 2014-11-12 MED ORDER — IBUPROFEN 600 MG PO TABS
600.0000 mg | ORAL_TABLET | Freq: Three times a day (TID) | ORAL | Status: DC | PRN
  Administered 2014-11-12 – 2014-11-15 (×6): 600 mg via ORAL
  Filled 2014-11-12 (×7): qty 1

## 2014-11-12 MED ORDER — PROPRANOLOL HCL 20 MG PO TABS
20.0000 mg | ORAL_TABLET | Freq: Three times a day (TID) | ORAL | Status: DC
  Administered 2014-11-12 – 2014-11-14 (×6): 20 mg via ORAL
  Filled 2014-11-12: qty 2
  Filled 2014-11-12 (×11): qty 1

## 2014-11-12 MED ORDER — LORAZEPAM 2 MG/ML IJ SOLN: 0.0000 mg | Freq: Four times a day (QID) | INTRAMUSCULAR | Status: DC

## 2014-11-12 MED ORDER — LORAZEPAM 1 MG PO TABS
0.0000 mg | ORAL_TABLET | Freq: Two times a day (BID) | ORAL | Status: DC
  Administered 2014-11-12 – 2014-11-13 (×2): 1 mg via ORAL
  Filled 2014-11-12: qty 1

## 2014-11-12 MED ORDER — VITAMIN B-1 100 MG PO TABS
100.0000 mg | ORAL_TABLET | Freq: Every day | ORAL | Status: DC
  Administered 2014-11-13 – 2014-11-22 (×10): 100 mg via ORAL
  Filled 2014-11-12: qty 1
  Filled 2014-11-12: qty 14
  Filled 2014-11-12: qty 1
  Filled 2014-11-12: qty 14
  Filled 2014-11-12 (×5): qty 1
  Filled 2014-11-12 (×2): qty 14
  Filled 2014-11-12 (×5): qty 1

## 2014-11-12 MED ORDER — TRAZODONE HCL 50 MG PO TABS
50.0000 mg | ORAL_TABLET | Freq: Every day | ORAL | Status: DC
  Administered 2014-11-12: 50 mg via ORAL
  Filled 2014-11-12: qty 1

## 2014-11-12 MED ORDER — TRAZODONE HCL 50 MG PO TABS
50.0000 mg | ORAL_TABLET | Freq: Every day | ORAL | Status: DC
  Administered 2014-11-12: 50 mg via ORAL
  Filled 2014-11-12 (×3): qty 1

## 2014-11-12 MED ORDER — ONDANSETRON HCL 4 MG PO TABS: 4.0000 mg | ORAL_TABLET | Freq: Three times a day (TID) | ORAL | Status: DC | PRN

## 2014-11-12 MED ORDER — HYDROXYZINE HCL 25 MG PO TABS
25.0000 mg | ORAL_TABLET | Freq: Four times a day (QID) | ORAL | Status: DC | PRN
  Administered 2014-11-14 – 2014-11-22 (×7): 25 mg via ORAL
  Filled 2014-11-12 (×4): qty 1
  Filled 2014-11-12: qty 20
  Filled 2014-11-12 (×3): qty 1

## 2014-11-12 MED ORDER — LORAZEPAM 1 MG PO TABS
0.0000 mg | ORAL_TABLET | Freq: Four times a day (QID) | ORAL | Status: DC
  Administered 2014-11-13: 1 mg via ORAL
  Filled 2014-11-12 (×2): qty 1

## 2014-11-12 MED ORDER — PAROXETINE HCL 20 MG PO TABS
20.0000 mg | ORAL_TABLET | Freq: Every day | ORAL | Status: DC
  Administered 2014-11-13: 20 mg via ORAL
  Filled 2014-11-12 (×2): qty 1

## 2014-11-12 MED ORDER — POTASSIUM CHLORIDE CRYS ER 20 MEQ PO TBCR
20.0000 meq | EXTENDED_RELEASE_TABLET | Freq: Every day | ORAL | Status: DC
  Administered 2014-11-13 – 2014-11-22 (×10): 20 meq via ORAL
  Filled 2014-11-12 (×7): qty 1
  Filled 2014-11-12: qty 14
  Filled 2014-11-12: qty 1
  Filled 2014-11-12: qty 14
  Filled 2014-11-12 (×2): qty 1
  Filled 2014-11-12 (×2): qty 14
  Filled 2014-11-12: qty 1

## 2014-11-12 MED ORDER — HYDROXYZINE HCL 25 MG PO TABS
25.0000 mg | ORAL_TABLET | Freq: Four times a day (QID) | ORAL | Status: DC | PRN
  Administered 2014-11-12: 25 mg via ORAL
  Filled 2014-11-12: qty 1

## 2014-11-12 MED ORDER — ALUM & MAG HYDROXIDE-SIMETH 200-200-20 MG/5ML PO SUSP
30.0000 mL | ORAL | Status: DC | PRN
  Administered 2014-11-16 – 2014-11-19 (×2): 30 mL via ORAL
  Filled 2014-11-12 (×2): qty 30

## 2014-11-12 NOTE — Progress Notes (Signed)
Pt did not attend karaoke group this evening.  

## 2014-11-12 NOTE — ED Notes (Signed)
Fluids encouraged. 

## 2014-11-12 NOTE — BH Assessment (Signed)
[facility] Dr. Jannifer FranklinAkintayo and Julieanne CottonJosephine, NP. Patient assigned to room 307-2. [facility]  Nursing report [phone]. [facility] [place].
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
## 2014-11-12 NOTE — Plan of Care (Signed)
Problem: Consults Goal: Depression Patient Education See Patient Education Module for education specifics.  Outcome: Completed/Met Date Met:  11/12/14 Nurse discussed depression/coping skills with patient.
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
## 2014-11-12 NOTE — Tx Team (Signed)
Initial Interdisciplinary Treatment Plan   PATIENT STRESSORS: Financial difficulties Medication change or noncompliance Occupational concerns Substance abuse   PATIENT STRENGTHS: Ability for insight Average or above average intelligence Capable of independent living Communication skills Motivation for treatment/growth Supportive family/friends   PROBLEM LIST: Problem List/Patient Goals Date to be addressed Date deferred Reason deferred Estimated date of resolution  "suicidal thoughts" 11/12/2014   D/c        "substance abuse" 11/12/2014   D/c        "anxiety" 11/12/2014   D/c  "depression" 11/12/2014   D/c  "panic" 11/12/2014   D/c               DISCHARGE CRITERIA:  Ability to meet basic life and health needs Improved stabilization in mood, thinking, and/or behavior Medical problems require only outpatient monitoring Motivation to continue treatment in a less acute level of care Need for constant or close observation no longer present Reduction of life-threatening or endangering symptoms to within safe limits Safe-care adequate arrangements made Verbal commitment to aftercare and medication compliance Withdrawal symptoms are absent or subacute and managed without 24-hour nursing intervention  PRELIMINARY DISCHARGE PLAN: Attend aftercare/continuing care group Attend PHP/IOP Attend 12-step recovery group Outpatient therapy Return to previous living arrangement  PATIENT/FAMIILY INVOLVEMENT: This treatment plan has been presented to and reviewed with the patient, Alyssa Kelley.  The patient and family have been given the opportunity to ask questions and make suggestions.  Earline MayotteKnight, Carey Lafon Shephard 11/12/2014, 7:39 PM

## 2014-11-13 ENCOUNTER — Encounter (HOSPITAL_COMMUNITY): Payer: Self-pay | Admitting: Psychiatry

## 2014-11-13 DIAGNOSIS — F1020 Alcohol dependence, uncomplicated: Secondary | ICD-10-CM

## 2014-11-13 DIAGNOSIS — F332 Major depressive disorder, recurrent severe without psychotic features: Principal | ICD-10-CM

## 2014-11-13 MED ORDER — CHLORDIAZEPOXIDE HCL 25 MG PO CAPS
25.0000 mg | ORAL_CAPSULE | Freq: Once | ORAL | Status: AC
  Administered 2014-11-13: 25 mg via ORAL
  Filled 2014-11-13: qty 1

## 2014-11-13 MED ORDER — MIRTAZAPINE 7.5 MG PO TABS
7.5000 mg | ORAL_TABLET | Freq: Every day | ORAL | Status: DC
  Administered 2014-11-13: 7.5 mg via ORAL
  Filled 2014-11-13 (×4): qty 1

## 2014-11-13 MED ORDER — PRAZOSIN HCL 1 MG PO CAPS
1.0000 mg | ORAL_CAPSULE | Freq: Every day | ORAL | Status: DC
  Administered 2014-11-13 – 2014-11-21 (×9): 1 mg via ORAL
  Filled 2014-11-13: qty 14
  Filled 2014-11-13 (×4): qty 1
  Filled 2014-11-13: qty 14
  Filled 2014-11-13: qty 1
  Filled 2014-11-13: qty 14
  Filled 2014-11-13 (×4): qty 1
  Filled 2014-11-13: qty 14
  Filled 2014-11-13: qty 1

## 2014-11-13 MED ORDER — CHLORDIAZEPOXIDE HCL 25 MG PO CAPS: 25.0000 mg | ORAL_CAPSULE | Freq: Four times a day (QID) | ORAL | Status: AC | PRN

## 2014-11-13 MED ORDER — HYDROXYZINE HCL 25 MG PO TABS: 25.0000 mg | ORAL_TABLET | Freq: Four times a day (QID) | ORAL | Status: DC | PRN

## 2014-11-13 MED ORDER — PAROXETINE HCL 20 MG PO TABS
40.0000 mg | ORAL_TABLET | Freq: Every day | ORAL | Status: DC
  Administered 2014-11-14 – 2014-11-22 (×9): 40 mg via ORAL
  Filled 2014-11-13: qty 2
  Filled 2014-11-13 (×3): qty 28
  Filled 2014-11-13 (×9): qty 2
  Filled 2014-11-13: qty 28

## 2014-11-13 MED ORDER — CHLORDIAZEPOXIDE HCL 25 MG PO CAPS
25.0000 mg | ORAL_CAPSULE | ORAL | Status: AC
  Administered 2014-11-15 – 2014-11-16 (×2): 25 mg via ORAL
  Filled 2014-11-13 (×2): qty 1

## 2014-11-13 MED ORDER — ADULT MULTIVITAMIN W/MINERALS CH
1.0000 | ORAL_TABLET | Freq: Every day | ORAL | Status: DC
  Administered 2014-11-13 – 2014-11-22 (×10): 1 via ORAL
  Filled 2014-11-13 (×15): qty 1

## 2014-11-13 MED ORDER — CHLORDIAZEPOXIDE HCL 25 MG PO CAPS
25.0000 mg | ORAL_CAPSULE | Freq: Every day | ORAL | Status: AC
  Administered 2014-11-17: 25 mg via ORAL
  Filled 2014-11-13: qty 1

## 2014-11-13 MED ORDER — LOPERAMIDE HCL 2 MG PO CAPS
2.0000 mg | ORAL_CAPSULE | ORAL | Status: AC | PRN
  Administered 2014-11-14: 4 mg via ORAL
  Filled 2014-11-13: qty 2

## 2014-11-13 MED ORDER — MIRTAZAPINE 30 MG PO TABS
30.0000 mg | ORAL_TABLET | Freq: Every day | ORAL | Status: DC
  Filled 2014-11-13 (×2): qty 1

## 2014-11-13 MED ORDER — CHLORDIAZEPOXIDE HCL 25 MG PO CAPS
25.0000 mg | ORAL_CAPSULE | Freq: Three times a day (TID) | ORAL | Status: AC
  Administered 2014-11-14 – 2014-11-15 (×3): 25 mg via ORAL
  Filled 2014-11-13 (×3): qty 1

## 2014-11-13 MED ORDER — NICOTINE 21 MG/24HR TD PT24
21.0000 mg | MEDICATED_PATCH | Freq: Every day | TRANSDERMAL | Status: DC
  Administered 2014-11-13 – 2014-11-22 (×10): 21 mg via TRANSDERMAL
  Filled 2014-11-13 (×7): qty 1
  Filled 2014-11-13: qty 14
  Filled 2014-11-13: qty 1
  Filled 2014-11-13: qty 14
  Filled 2014-11-13: qty 1
  Filled 2014-11-13: qty 14
  Filled 2014-11-13: qty 1
  Filled 2014-11-13: qty 14
  Filled 2014-11-13: qty 1

## 2014-11-13 MED ORDER — CHLORDIAZEPOXIDE HCL 25 MG PO CAPS
25.0000 mg | ORAL_CAPSULE | Freq: Four times a day (QID) | ORAL | Status: AC
  Administered 2014-11-13 – 2014-11-14 (×4): 25 mg via ORAL
  Filled 2014-11-13 (×3): qty 1

## 2014-11-13 NOTE — Plan of Care (Signed)
Problem: Diagnosis: Increased Risk For Suicide Attempt Goal: STG-Patient Will Report Suicidal Feelings to Staff Outcome: Progressing Pt reports SI without a plan this shift.  She verbally contracts for safety.
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
## 2014-11-13 NOTE — [facility] Group Notes (Signed)
[facility] Note  11/13/2014 8:45 AM  Participation Quality: Alert, Appropriate and Oriented  Mood/Affect: Appropriate  Depression Rating: 10  Anxiety Rating: 10  Thoughts of Suicide: Pt endorses SI  Will you contract for safety? Yes  Current AVH: Pt denies  Plan for Discharge/Comments: Pt attended discharge planning group and actively participated in group. CSW provided pt with today's workbook. Pt participated appropriately in group and was able to verbalize reasons for admission. Pt complains of tremors and anxiety as symptoms of withdrawal. Pt expressed that she wants residential treatment for substance abuse.  Transportation Means: Pt reports access to transportation  Supports: No supports mentioned at this time  Chad CordialLauren Carter, LCSWA 11/13/2014 10:01 AM

## 2014-11-13 NOTE — Progress Notes (Signed)
D: Pt denies SI/HI/AVH. Pt is pleasant and cooperative. Pt stated she was feeling a little better today, pt stated she have been doing some bad things (stealing ) to pay for her drug habit.   A: Pt was offered support and encouragement. Pt was given scheduled medications. Pt was encourage to attend groups. Q 15 minute checks were done for safety.   R:Pt attends groups and interacts well with peers and staff. Pt is taking medication. Pt has no complaints at this time .Pt receptive to treatment and safety maintained on unit.

## 2014-11-13 NOTE — Progress Notes (Signed)
Per Pt request, [facility] referral made.   Chad CordialLauren Carter, Theresia MajorsLCSWA [phone]
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
## 2014-11-13 NOTE — Progress Notes (Addendum)
D:  Per pt self inventory pt reports sleeping poorly, appetite fair, energy level low, ability to pay attention poor, rates depression at a 10 out of 10, hopelessness at a 10 out of 10, anxiety at a 10 out of 10, depressed, anxious, goal today:  "Come to terms with life", endorses SI all the time, contracts for safety, denies HI/AVH.    A:  Emotional support provided, Encouraged pt to continue with treatment plan and attend all group activities, q15 min checks maintained for safety.  R:  Pt is receptive, has been up in dayroom, going to groups, pleasant during interaction and cooperative with staff and other patients.

## 2014-11-13 NOTE — [facility] Group Notes (Signed)
[facility] LCSW Group Therapy 11/13/2014 1:15pm  Type of Therapy: Group Therapy- Feelings Around Relapse and Recovery  Participation Level: Active   Participation Quality:  Appropriate  Affect:  Appropriate   Cognitive: Alert and Oriented   Insight:  Developing/Improving   Engagement in Therapy: Developing/Improving and Engaged   Modes of Intervention: Clarification, Confrontation, Discussion, Education, Exploration, Limit-setting, Orientation, Problem-solving, Rapport Building, Dance movement psychotherapisteality Testing, Socialization and Support  Summary of Progress/Problems: The topic for today was feelings about relapse. Pt discussed what relapse prevention is to them and identified triggers that they are on the path to relapse. Pt processed their feeling towards relapse and was able to relate to peers. Pt discussed coping skills that can be used for relapse prevention. Pt participated appropriately in group discussion and was able to identify risk factors related to relapse such as negative influences and spending most of her time with people who use substances. Pt also was able to identify warning signs related to her depression and substance use such as continued isolation. Pt demonstrates developing insight AEB her recognition of her role played in frequent relapses, reporting that she did not comply with the recovery plan or follow advice from peers. Pt is motivated for further treatment as she feels her depression has become overwhelming and is further contributing to her polysubstance abuse.    Therapeutic Modalities:   Cognitive Behavioral Therapy Solution-Focused Therapy Assertiveness Training Relapse Prevention Therapy    Lamar SprinklesLauren Carter, LCSWA [phone] 11/13/2014 3:11 PM

## 2014-11-13 NOTE — Progress Notes (Signed)
Pt attended AA group 

## 2014-11-13 NOTE — B[facility] Counselor (Addendum)
Adult Comprehensive Assessment  Patient ID: Alyssa Kelley, female   DOB: July 31, 1970, 44 y.o.   MRN: [id number]  Information Source: Information source: Patient  Current Stressors:  Educational / Learning stressors: n/a Employment / Job issues: unemployment Family Relationships: limited family Nurse, learning disabilitycontact Financial / Lack of resources (include bankruptcy): no income Housing / Lack of housing: homeless but living with friends Physical health (include injuries & life threatening diseases): n/a Social relationships: little social support Substance abuse: daily use of mulitple substances Bereavement / Loss: loss parents at age 89 (father was murdered); grandmother passed away approximately one year ago  Living/Environment/Situation:  Living Arrangements: Non-relatives/Friends Living conditions (as described by patient or guardian): chaotic environment, drug use How long has patient lived in current situation?: 1 month What is atmosphere in current home: Dangerous, Chaotic  Family History:  Marital status: Single Does patient have children?: Yes How many children?: 2 How is patient's relationship with their children?: Pt's aunt adopted her children; Pt remains in contact with them  Childhood History:  By whom was/is the patient raised?: Both parents Additional childhood history information: Pt's father was murdered at age 44 and Pt's mother died 8 months later due to chirrosis of the liver; parents both abused substances Description of patient's relationship with caregiver when they were a child: Pt reports a good relatioship with her parents however they both used and the environment could become chaotic at times.  Patient's description of current relationship with people who raised him/her: Parents are deceased Does patient have siblings?: No Did patient suffer any verbal/emotional/physical/sexual abuse as a child?: No Did patient suffer from severe childhood neglect?: No Has patient  ever been sexually abused/assaulted/raped as an adolescent or adult?: No Was the patient ever a victim of a crime or a disaster?: No Witnessed domestic violence?: No Has patient been effected by domestic violence as an adult?: No  Education:  Highest grade of school patient has completed: 11th Currently a Consulting civil engineerstudent?: No Learning disability?: No  Employment/Work Situation:   Employment situation: Unemployed Patient's job has been impacted by current illness: Yes Describe how patient's job has been impacted: substance use keeps her from getting a job What is the longest time patient has a held a job?: 5 years  Where was the patient employed at that time?: [employer] Has patient ever been in the [employer] and Companymilitary?: No Has patient ever served in Buyer, retailcombat?: No  Financial Resources:   Financial resources: No income  Alcohol/Substance Abuse:   What has been your use of drugs/alcohol within the last 12 months?: Pst month: heroine, methadone, benzodiazapines, cocaine, alcohol- using daily. Long history of substance abuse since the age of 44- drug of choice is heroine Alcohol/Substance Abuse Treatment Hx: Past Tx, Inpatient, Past detox, Attends AA/NA, Past Tx, Outpatient Has alcohol/substance abuse ever caused legal problems?: Yes  Social Support System:   Patient's Community Support System: Poor Describe Community Support System: all friends and family use Type of faith/religion: Ephriam KnucklesChristian How does patient's faith help to cope with current illness?: helps at times  Leisure/Recreation:   Leisure and Hobbies: Writes to family who are out of town; horse back riding  Strengths/Needs:   What things does the patient do well?: good with people, easy to talk to In what areas does patient struggle / problems for patient: addiction, depression   Discharge Plan:   Does patient have access to transportation?: No Plan for no access to transportation at discharge: Plans to use public transportation   Will patient be  returning to same living situation after discharge?: No Plan for living situation after discharge: Plans to go to residential treatment Currently receiving community mental health services: Yes (From Whom) Vesta Mixer) Does patient have financial barriers related to discharge medications?: Yes Patient description of barriers related to discharge medications: Limited income and no insurance  Summary/Recommendations:     Patient is a 44yo year old Caucasian female with diagnoses of Alcohol Use Disorder, severe, Heroine abuse, alcohol abuse, cocaine abuse, and Major Depressive Disorder, recurrent, severe. Pt reports getting into a fight with a friend prior to admission due to substance-related issues. She reports that her feelings of hopelessness are overwhelming and that this is the first time she has experienced SI. Pt has a long history of substance abuse beginning at age 62. At that time, her father had been murdered and her mother died of cirrhosis of the liver. Pt reports that both her parents abused substances as well. Pt identifies her drug of choice as heroine; however at this time she is using multiple substances such as heroine, methadone, benzodiazepines, opiates, and alcohol. Pt was released from jail one month ago and relapsed upon release. Since that time she has been staying with friends and using. She is motivated for treatment and is requesting residential treatment. Patient will benefit from crisis stabilization, medication evaluation, group therapy and psycho education in addition to case management for discharge planning. Pt declines Quitline referral.    Elaina Hoops. 11/13/2014

## 2014-11-13 NOTE — Progress Notes (Signed)
Recreation Therapy Notes  Date: 05.27.16 Time: 9:30am Location: 300 Group Room  Group Topic: Stress Management  Goal Area(s) Addresses:  Patient will verbalize importance of using healthy stress management.  Patient will identify positive emotions associated with healthy stress management.   Behavioral Response: Engaged, appropriate  Intervention: Progressive Muscle Relaxation, Guided Imagery Script  Activity :  Progressive Muscle Relaxation & Guided Imagery.  LRT introduced and educated patients on stress management  Techniques of progressive muscle relaxation and guided imagery.  Scripts were used to deliver both techniques to patients, patients were asked to follow the script read allowed by LRT to engage in practicing both stress management techniques.  Education:  Stress Management, Discharge Planning.   Education Outcome: Acknowledges edcuation/In group clarification offered/Needs additional education  Clinical Observations/Feedback: Patient was engaged in the activity.    Alyssa Kelley Alyssa Kelley, LRT/CTRS         Caroll RancherLindsay, Maguadalupe Lata A 11/13/2014 4:03 PM

## 2014-11-13 NOTE — Progress Notes (Signed)
D: Pt has anxious, depressed affect and mood.  Pt reports she has "a lot of anxiety."  Pt reports she is here because "just wanting to kill myself, hopelessness, depression's really bad."  Pt reports SI without a plan.  She verbally contracts for safety.  Pt denies HI, denies hallucinations.  Pt reports her goal tonight is "just to rest."  Pt has stayed in her room for the majority of the shift.  She did not attend evening group.    A: Introduced self to pt.  Met with pt 1:1 and provided support and encouragement.  Actively listened to pt.  PO fluids encouraged and provided.  Medications administered per order.  PRN medication administered for pain. R: Pt is compliant with medications.  Pt verbally contracts for safety and reports that she will notify staff of needs and concerns.  Will continue to monitor and assess.

## 2014-11-13 NOTE — [facility] Suicide Risk Assessment (Signed)
[facility]   Nursing information obtained from:  Patient Demographic factors:  Caucasian, Low socioeconomic status Current Mental Status:  Suicidal ideation indicated by patient Loss Factors:  Legal issues, Financial problems / change in socioeconomic status Historical Factors:  Impulsivity Risk Reduction Factors:  Living with another person, especially a relative Total Time spent with patient: 45 minutes Principal Problem: <principal problem not specified> Diagnosis:   Patient Active Problem List   Diagnosis Date Noted  . 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 Alcohol use disorder, severe, dependence [F10.20] 11/12/2014  . Heroin abuse [F11.10] 09/10/2013  . Alcohol abuse [F10.10] 09/10/2013  . Cocaine abuse [F14.10] 09/10/2013     Continued Clinical Symptoms:  Alcohol Use Disorder Identification Test Final Score (AUDIT): 36 The "Alcohol Use Disorders Identification Test", Guidelines for Use in Primary Care, Second Edition.  World Science writerHealth Organization [facility](WHO). Score between 0-7:  no or low risk or alcohol related problems. Score between 8-15:  moderate risk of alcohol related problems. Score between 16-19:  high risk of alcohol related problems. Score 20 or above:  warrants further diagnostic evaluation for alcohol dependence and treatment.   CLINICAL FACTORS:   Panic Attacks Depression:   Comorbid alcohol abuse/dependence Alcohol/Substance Abuse/Dependencies  Psychiatric Specialty Exam: Physical Exam  ROS  Blood pressure 110/50, pulse 72, temperature 98.5 F (36.9 C), temperature source Oral, resp. rate 16, height 5' (1.524 m), weight 93.441 kg (206 lb), last menstrual period 10/21/2014, SpO2 98 %.Body mass index is 40.23 kg/(m^2).  COGNITIVE FEATURES THAT CONTRIBUTE TO RISK:  Closed-mindedness, Polarized thinking and Thought constriction (tunnel vision)    SUICIDE RISK:   Moderate:  Frequent  suicidal ideation with limited intensity, and duration, some specificity in terms of plans, no associated intent, good self-control, limited dysphoria/symptomatology, some risk factors present, and identifiable protective factors, including available and accessible social support.  PLAN OF CARE: Supportive approach/coping skill                               Alcohol dependence; Librium detox protocol                                Depression ; will continue the Paxil and optimize response                               Use CBT/mindfulness                               Explore residential treatment options  Medical Decision Making:  Review of Psycho-Social Stressors (1), Review or order clinical lab tests (1), Review of Medication Regimen & Side Effects (2) and Review of New Medication or Change in Dosage (2)  I certify that inpatient services furnished can reasonably be expected to improve the patient's condition.   Jeriah Skufca A 11/13/2014, 5:38 PM

## 2014-11-14 MED ORDER — GABAPENTIN 300 MG PO CAPS
300.0000 mg | ORAL_CAPSULE | Freq: Every day | ORAL | Status: DC
  Administered 2014-11-14 – 2014-11-21 (×8): 300 mg via ORAL
  Filled 2014-11-14 (×14): qty 1

## 2014-11-14 MED ORDER — MIRTAZAPINE 30 MG PO TABS
30.0000 mg | ORAL_TABLET | Freq: Every day | ORAL | Status: DC
  Administered 2014-11-14 – 2014-11-21 (×7): 30 mg via ORAL
  Filled 2014-11-14 (×4): qty 1
  Filled 2014-11-14: qty 14
  Filled 2014-11-14: qty 1
  Filled 2014-11-14: qty 14
  Filled 2014-11-14: qty 1
  Filled 2014-11-14: qty 14
  Filled 2014-11-14 (×3): qty 1
  Filled 2014-11-14: qty 14
  Filled 2014-11-14: qty 1

## 2014-11-14 MED ORDER — GABAPENTIN 300 MG PO CAPS
300.0000 mg | ORAL_CAPSULE | Freq: Three times a day (TID) | ORAL | Status: DC
  Administered 2014-11-14 – 2014-11-22 (×24): 300 mg via ORAL
  Filled 2014-11-14: qty 56
  Filled 2014-11-14 (×10): qty 1
  Filled 2014-11-14: qty 56
  Filled 2014-11-14 (×3): qty 1
  Filled 2014-11-14 (×2): qty 56
  Filled 2014-11-14 (×7): qty 1
  Filled 2014-11-14 (×2): qty 56
  Filled 2014-11-14: qty 1
  Filled 2014-11-14: qty 56
  Filled 2014-11-14: qty 1
  Filled 2014-11-14: qty 56
  Filled 2014-11-14: qty 1
  Filled 2014-11-14 (×3): qty 56
  Filled 2014-11-14 (×4): qty 1
  Filled 2014-11-14: qty 56
  Filled 2014-11-14: qty 1

## 2014-11-14 MED ORDER — PROPRANOLOL HCL 10 MG PO TABS
10.0000 mg | ORAL_TABLET | Freq: Three times a day (TID) | ORAL | Status: DC
Start: 2014-11-14 — End: 2014-11-22
  Administered 2014-11-15 – 2014-11-22 (×21): 10 mg via ORAL
  Filled 2014-11-14 (×4): qty 1
  Filled 2014-11-14: qty 42
  Filled 2014-11-14: qty 1
  Filled 2014-11-14: qty 42
  Filled 2014-11-14: qty 1
  Filled 2014-11-14 (×2): qty 42
  Filled 2014-11-14 (×6): qty 1
  Filled 2014-11-14: qty 42
  Filled 2014-11-14: qty 1
  Filled 2014-11-14: qty 42
  Filled 2014-11-14 (×7): qty 1
  Filled 2014-11-14: qty 42
  Filled 2014-11-14: qty 1
  Filled 2014-11-14 (×2): qty 42
  Filled 2014-11-14: qty 1
  Filled 2014-11-14: qty 42
  Filled 2014-11-14: qty 1
  Filled 2014-11-14 (×2): qty 42
  Filled 2014-11-14 (×2): qty 1

## 2014-11-14 NOTE — Progress Notes (Signed)
D: Pt denies HI/AVH. Pt is pleasant and cooperative. Pt SI-contracts for safety. Pt stated she was doing ok today, only issues with mouth pain. Pt plans to hopefully go to Tx facility.   A: Pt was offered support and encouragement. Pt was given scheduled medications. Pt was encourage to attend groups. Q 15 minute checks were done for safety.   R:Pt attends groups and interacts well with peers and staff. Pt is taking medication. Pt has no complaints at this time .Pt receptive to treatment and safety maintained on unit.

## 2014-11-14 NOTE — [facility] Group Notes (Addendum)
[facility] Group Notes:  (Clinical Social Work)  11/14/2014     10-11AM  Summary of Progress/Problems:   The main focus of today's process group was to learn how to use a decisional balance exercise to move forward in the Stages of Change, which were described and discussed.  Motivational Interviewing and a worksheet were utilized to help patients explore in depth the perceived benefits and costs of unhealthy coping techniques, as well as the  benefits and costs of replacing that with a healthy coping skills.   The patient expressed that their unhealthy coping involves using drugs while her healthy coping skills involves spending time with her two adult sons.  She left about 30 minutes into group, did not return.  Type of Therapy:  Group Therapy - Process   Participation Level:  Active  Participation Quality:  Attentive, Sharing and Supportive  Affect:  Blunted  Cognitive:  Alert, Appropriate and Oriented  Insight:  Engaged  Engagement in Therapy:  Engaged  Modes of Intervention:  Education, Motivational Interviewing  Ambrose MantleMareida Grossman-Orr, LCSW 11/14/2014, 12:28 PM

## 2014-11-14 NOTE — Progress Notes (Signed)
[facility]  11/14/2014 5:48 PM Alyssa Kelley  MRN:  [id number] Subjective:  Alyssa Kelley has continued to be detox. States he hardly slept last night. States she is having a lot of nightmare. States that even when she naps she has nightmares. States they are of violent content. She states she really wants to get her life back together and that she will be willing to do whatever she would need to do to accomplish it. Principal Problem: <principal problem not specified> Diagnosis:   Patient Active Problem List   Diagnosis Date Noted  . 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 Alcohol use disorder, severe, dependence [F10.20] 11/12/2014  . Heroin abuse [F11.10] 09/10/2013  . Alcohol abuse [F10.10] 09/10/2013  . Cocaine abuse [F14.10] 09/10/2013   Total Time spent with patient: 30 minutes   Past Medical History:  Past Medical History  Diagnosis Date  . Abscess of mouth   . Heroin abuse   . Anxiety   . Depression   . Hypertension    History reviewed. No pertinent past surgical history. Family History: History reviewed. No pertinent family history. Social History:  History  Alcohol Use  . Yes    Comment: one 12 pack qd     History  Drug Use  . Yes  . Special: Cocaine, Oxycodone, Heroin    History   Social History  . Marital Status: Single    Spouse Name: N/A  . Number of Children: N/A  . Years of Education: N/A   Social History Main Topics  . Smoking status: Current Every Day Smoker -- 1.00 packs/day for 15 years    Types: Cigarettes  . Smokeless tobacco: Not on file  . Alcohol Use: Yes     Comment: one 12 pack qd  . Drug Use: Yes    Special: Cocaine, Oxycodone, Heroin  . Sexual Activity: Yes    Birth Control/ Protection: Condom   Other Topics Concern  . None   Social History Narrative   Additional History:    Sleep: Poor  Appetite:  Poor   Assessment:   Musculoskeletal: Strength & Muscle Tone:  within normal limits Gait & Station: normal Patient leans: N/A   Psychiatric Specialty Exam: Physical Exam  Review of Systems  Constitutional: Positive for malaise/fatigue.  HENT: Negative.   Eyes: Negative.   Respiratory: Negative.   Cardiovascular: Negative.   Gastrointestinal: Negative.   Genitourinary: Negative.   Musculoskeletal: Negative.   Skin: Negative.   Neurological: Positive for weakness.  Endo/Heme/Allergies: Negative.   Psychiatric/Behavioral: Positive for depression and substance abuse. The patient is nervous/anxious and has insomnia.     Blood pressure 138/92, pulse 55, temperature 98.2 F (36.8 C), temperature source Oral, resp. rate 18, height 5' (1.524 m), weight 93.441 kg (206 lb), last menstrual period 10/21/2014, SpO2 98 %.Body mass index is 40.23 kg/(m^2).  General Appearance: Disheveled  Eye SolicitorContact::  Fair  Speech:  Clear and Coherent and Slow  Volume:  Decreased  Mood:  Anxious and Depressed  Affect:  Depressed and anxious worried  Thought Process:  Coherent and Goal Directed  Orientation:  Full (Time, Place, and Person)  Thought Content:  symptoms events worries concerns  Suicidal Thoughts:  not today  Homicidal Thoughts:  No  Memory:  Immediate;   Fair Recent;   Fair Remote;   Fair  Judgement:  Fair  Insight:  Present and Shallow  Psychomotor Activity:  Restlessness  Concentration:  Fair  Recall:  Fair  [employer] of Knowledge:Fair  Language: Fair  Akathisia:  No  Handed:  Right  AIMS (if indicated):     Assets:  Desire for Improvement  ADL's:  Intact  Cognition: WNL  Sleep:  Number of Hours: 6     Current Medications: Current Facility-Administered Medications  Medication Dose Route Frequency Provider Last Rate Last Dose  . alum & mag hydroxide-simeth (MAALOX/MYLANTA) 200-200-20 MG/5ML suspension 30 mL  30 mL Oral PRN Earney Navy, NP      . chlordiazePOXIDE (LIBRIUM) capsule 25 mg  25 mg Oral Q6H PRN Rachael Fee, MD      .  chlordiazePOXIDE (LIBRIUM) capsule 25 mg  25 mg Oral TID Rachael Fee, MD   25 mg at 11/14/14 1625   Followed by  . [START ON 11/15/2014] chlordiazePOXIDE (LIBRIUM) capsule 25 mg  25 mg Oral BH-qamhs Rachael Fee, MD       Followed by  . [START ON 11/17/2014] chlordiazePOXIDE (LIBRIUM) capsule 25 mg  25 mg Oral Daily Rachael Fee, MD      . gabapentin (NEURONTIN) capsule 300 mg  300 mg Oral TID Rachael Fee, MD   300 mg at 11/14/14 1625  . gabapentin (NEURONTIN) capsule 300 mg  300 mg Oral QHS Rachael Fee, MD      . hydrOXYzine (ATARAX/VISTARIL) tablet 25 mg  25 mg Oral Q6H PRN Earney Navy, NP   25 mg at 11/14/14 0049  . ibuprofen (ADVIL,MOTRIN) tablet 600 mg  600 mg Oral Q8H PRN Earney Navy, NP   600 mg at 11/14/14 0048  . loperamide (IMODIUM) capsule 2-4 mg  2-4 mg Oral PRN Rachael Fee, MD   4 mg at 11/14/14 1219  . mirtazapine (REMERON) tablet 30 mg  30 mg Oral QHS Rachael Fee, MD      . multivitamin with minerals tablet 1 tablet  1 tablet Oral Daily Rachael Fee, MD   1 tablet at 11/14/14 0745  . nicotine (NICODERM CQ - dosed in mg/24 hours) patch 21 mg  21 mg Transdermal Daily Rachael Fee, MD   21 mg at 11/14/14 0745  . ondansetron (ZOFRAN) tablet 4 mg  4 mg Oral Q8H PRN Earney Navy, NP      . PARoxetine (PAXIL) tablet 40 mg  40 mg Oral Daily Rachael Fee, MD   40 mg at 11/14/14 0745  . potassium chloride SA (K-DUR,KLOR-CON) CR tablet 20 mEq  20 mEq Oral Daily Earney Navy, NP   20 mEq at 11/14/14 0745  . prazosin (MINIPRESS) capsule 1 mg  1 mg Oral QHS Rachael Fee, MD   1 mg at 11/13/14 2217  . propranolol (INDERAL) tablet 10 mg  10 mg Oral TID Adonis Brook, NP   10 mg at 11/14/14 1624  . thiamine (B-1) injection 100 mg  100 mg Intravenous Daily Earney Navy, NP   100 mg at 11/13/14 0800  . thiamine (VITAMIN B-1) tablet 100 mg  100 mg Oral Daily Earney Navy, NP   100 mg at 11/14/14 0745    Lab Results: No results found for this or  any previous visit (from the past 48 hour(s)).  Physical Findings: AIMS: Facial and Oral Movements Muscles of Facial Expression: None, normal Lips and Perioral Area: None, normal Jaw: None, normal Tongue: None, normal,Extremity Movements Upper (arms, wrists, hands, fingers): None, normal Lower (legs, knees, ankles, toes): None, normal, Trunk Movements Neck, shoulders,  hips: None, normal, Overall Severity Severity of abnormal movements (highest score from questions above): None, normal Incapacitation due to abnormal movements: None, normal Patient's awareness of abnormal movements (rate only patient's report): No Awareness, Dental Status Current problems with teeth and/or dentures?: No Does patient usually wear dentures?: No  CIWA:  CIWA-Ar Total: 0 COWS:  COWS Total Score: 3  Treatment Plan Summary: Daily contact with patient to assess and evaluate symptoms and progress in treatment and Medication management Supportive approach/copign skills Alcohol dependence; continue the Librium detox protocol Work a relapse prevention plan Nightmares; will have a trial with the Prazosin 1 mg HS Insomnia; will try the Remeron at 30 mg HS dose Depression; will continue the Paxil at 40 mg daily ( will be mindful of any possible interaction with the Remeron causing a serotonin syndrome Explore residential treatment options  Medical Decision Making:  Review of Psycho-Social Stressors (1), Review of Medication Regimen & Side Effects (2) and Review of New Medication or Change in Dosage (2)     Daena Alper A 11/14/2014, 5:48 PM

## 2014-11-14 NOTE — [facility] Group Notes (Signed)
[facility] Group Notes:  (Nursing/MHT/Case Management/Adjunct)  Date:  11/14/2014  Time:  1315pm  Type of Therapy:  Nurse Education  Participation Level:  Did Not Attend  Participation Quality:  Did not attend  Affect:  Did not attend  Cognitive:  Did not attend  Insight:  None  Engagement in Group:  Did not attend  Modes of Intervention:  Discussion, Education and Support  Summary of Progress/Problems: Patient was invited to group. Pt did not attend and remained in bed resting.  Laurine BlazerGuthrie, GrenadaBrittany A 11/14/2014, 2:16 PM

## 2014-11-14 NOTE — Progress Notes (Signed)
Patient ID: Alyssa BeckmannLeigh N Lizama, female   DOB: 06/09/1971, 44 y.o.   MRN: [id number]   D: Pt has been very flat and depressed on the unit today. Pt was in the bed most of the day, she did not attend any groups nor did she engage in any treatment. Pt reported that she was having severe withdrawal symptoms and was given medication to help with withdrawals. Pt reported on her self inventory sheet that her depression was a 9, and her hopelessness was a 9. Pt reported that her goal for today was to sleep, and that there are times when she feels suicidal but currently she is negative. Pt reported being negative SI/HI, no AH/VH noted. A: 15 min checks continued for patient safety. R: Pt safety maintained.

## 2014-11-15 DIAGNOSIS — F1010 Alcohol abuse, uncomplicated: Secondary | ICD-10-CM

## 2014-11-15 DIAGNOSIS — G4700 Insomnia, unspecified: Secondary | ICD-10-CM

## 2014-11-15 DIAGNOSIS — F1410 Cocaine abuse, uncomplicated: Secondary | ICD-10-CM

## 2014-11-15 MED ORDER — ZOLPIDEM TARTRATE 10 MG PO TABS
10.0000 mg | ORAL_TABLET | Freq: Every evening | ORAL | Status: DC | PRN
  Administered 2014-11-15: 10 mg via ORAL
  Filled 2014-11-15: qty 1

## 2014-11-15 MED ORDER — IBUPROFEN 600 MG PO TABS
600.0000 mg | ORAL_TABLET | Freq: Three times a day (TID) | ORAL | Status: DC | PRN
  Administered 2014-11-15 – 2014-11-22 (×16): 600 mg via ORAL
  Filled 2014-11-15 (×16): qty 1

## 2014-11-15 NOTE — [facility] Group Notes (Signed)
[facility] Group Notes:  (Clinical Social Work)  11/15/2014  10:00-11:00AM  Summary of Progress/Problems:   The main focus of today's process group was to   1)  discuss the importance of adding supports  2)  define health supports versus unhealthy supports  3)  identify the patient's current unhealthy supports and plan how to handle them  4)  Identify the patient's current healthy supports and plan what to add.  An emphasis was placed on using counselor, doctor, therapy groups, 12-step groups, and problem-specific support groups to expand supports.    The patient expressed full comprehension of the concepts presented, and agreed that there is a need to add more supports.  The patient stated she wants to go to rehab and get a psychiatrist in the community for her meds.  She stated her boyfriend of 16 years using alcohol heavily, triggering her.  He likely will not agree to get help so she thinks she will have to move away, but they share a child and she is very frightened about this.  Type of Therapy:  Process Group with Motivational Interviewing  Participation Level:  Active  Participation Quality:  Appropriate, Attentive, Sharing and Supportive  Affect:  Anxious, Blunted and Depressed  Cognitive:  Alert  Insight:  Engaged  Engagement in Therapy:  Engaged  Modes of Intervention:   Education, Support and Processing, Activity  Ambrose MantleMareida Grossman-Orr, LCSW 11/15/2014

## 2014-11-15 NOTE — Progress Notes (Signed)
[facility] MD Progress Note  11/15/2014 8:27 PM Alyssa Kelley  MRN:  [id number] Subjective:  [name] continues to have a hard time with sleep. She is not having the nightmares but can sleep the whole night. Will like to use Ambien if only when she is here as she knows she will not be able to continue taking it. She is wanting to go to rehab very badly. States she does not know if she is going to be able to make it other wise. Principal Problem: <principal problem not specified> Diagnosis:   Patient Active Problem List   Diagnosis Date Noted  . 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 Alcohol use disorder, severe, dependence [F10.20] 11/12/2014  . Heroin abuse [F11.10] 09/10/2013  . Alcohol abuse [F10.10] 09/10/2013  . Cocaine abuse [F14.10] 09/10/2013   Total Time spent with patient: 30 minutes   Past Medical History:  Past Medical History  Diagnosis Date  . Abscess of mouth   . Heroin abuse   . Anxiety   . Depression   . Hypertension    History reviewed. No pertinent past surgical history. Family History: History reviewed. No pertinent family history. Social History:  History  Alcohol Use  . Yes    Comment: one 12 pack qd     History  Drug Use  . Yes  . Special: Cocaine, Oxycodone, Heroin    History   Social History  . Marital Status: Single    Spouse Name: N/A  . Number of Children: N/A  . Years of Education: N/A   Social History Main Topics  . Smoking status: Current Every Day Smoker -- 1.00 packs/day for 15 years    Types: Cigarettes  . Smokeless tobacco: Not on file  . Alcohol Use: Yes     Comment: one 12 pack qd  . Drug Use: Yes    Special: Cocaine, Oxycodone, Heroin  . Sexual Activity: Yes    Birth Control/ Protection: Condom   Other Topics Concern  . None   Social History Narrative   Additional History:    Sleep: Poor  Appetite:  Fair   Assessment:   Musculoskeletal: Strength & Muscle Tone:  within normal limits Gait & Station: normal Patient leans: N/A   Psychiatric Specialty Exam: Physical Exam  Review of Systems  Constitutional: Positive for malaise/fatigue.  HENT: Negative.   Eyes: Negative.   Respiratory: Negative.   Cardiovascular: Negative.   Gastrointestinal: Negative.   Genitourinary: Negative.   Musculoskeletal: Negative.   Skin: Negative.   Neurological: Negative.   Endo/Heme/Allergies: Negative.   Psychiatric/Behavioral: Positive for depression and substance abuse. The patient is nervous/anxious and has insomnia.     Blood pressure 124/72, pulse 60, temperature 98.4 F (36.9 C), temperature source Oral, resp. rate 18, height 5' (1.524 m), weight 93.441 kg (206 lb), last menstrual period 10/21/2014, SpO2 98 %.Body mass index is 40.23 kg/(m^2).  General Appearance: Fairly Groomed  Patent attorney::  Fair  Speech:  Clear and Coherent  Volume:  Normal  Mood:  Anxious and worried  Affect:  anxious worried  Thought Process:  Coherent and Goal Directed  Orientation:  Full (Time, Place, and Person)  Thought Content:  symptoms envents worries concerns  Suicidal Thoughts:  No  Homicidal Thoughts:  No  Memory:  Immediate;   Fair Recent;   Fair Remote;   Fair  Judgement:  Fair  Insight:  Present  Psychomotor Activity:  Restlessness  Concentration:  Fair  Recall:  Fair  [employer] of Knowledge:Fair  Language: Fair  Akathisia:  No  Handed:  Right  AIMS (if indicated):     Assets:  Desire for Improvement  ADL's:  Intact  Cognition: WNL  Sleep:  Number of Hours: 5.75     Current Medications: Current Facility-Administered Medications  Medication Dose Route Frequency Provider Last Rate Last Dose  . alum & mag hydroxide-simeth (MAALOX/MYLANTA) 200-200-20 MG/5ML suspension 30 mL  30 mL Oral PRN Earney Navy, NP      . chlordiazePOXIDE (LIBRIUM) capsule 25 mg  25 mg Oral Q6H PRN Rachael Fee, MD      . chlordiazePOXIDE (LIBRIUM) capsule 25 mg  25 mg Oral  BH-qamhs Rachael Fee, MD       Followed by  . [START ON 11/17/2014] chlordiazePOXIDE (LIBRIUM) capsule 25 mg  25 mg Oral Daily Rachael Fee, MD      . gabapentin (NEURONTIN) capsule 300 mg  300 mg Oral TID Rachael Fee, MD   300 mg at 11/15/14 1725  . gabapentin (NEURONTIN) capsule 300 mg  300 mg Oral QHS Rachael Fee, MD   300 mg at 11/14/14 2158  . hydrOXYzine (ATARAX/VISTARIL) tablet 25 mg  25 mg Oral Q6H PRN Earney Navy, NP   25 mg at 11/14/14 2158  . ibuprofen (ADVIL,MOTRIN) tablet 600 mg  600 mg Oral Q8H PRN Adonis Brook, NP   600 mg at 11/15/14 1726  . loperamide (IMODIUM) capsule 2-4 mg  2-4 mg Oral PRN Rachael Fee, MD   4 mg at 11/14/14 1219  . mirtazapine (REMERON) tablet 30 mg  30 mg Oral QHS Rachael Fee, MD   30 mg at 11/14/14 2158  . multivitamin with minerals tablet 1 tablet  1 tablet Oral Daily Rachael Fee, MD   1 tablet at 11/15/14 [phone]  . nicotine (NICODERM CQ - dosed in mg/24 hours) patch 21 mg  21 mg Transdermal Daily Rachael Fee, MD   21 mg at 11/15/14 0829  . ondansetron (ZOFRAN) tablet 4 mg  4 mg Oral Q8H PRN Earney Navy, NP      . PARoxetine (PAXIL) tablet 40 mg  40 mg Oral Daily Rachael Fee, MD   40 mg at 11/15/14 9604  . potassium chloride SA (K-DUR,KLOR-CON) CR tablet 20 mEq  20 mEq Oral Daily Earney Navy, NP   20 mEq at 11/15/14 5409  . prazosin (MINIPRESS) capsule 1 mg  1 mg Oral QHS Rachael Fee, MD   1 mg at 11/14/14 2158  . propranolol (INDERAL) tablet 10 mg  10 mg Oral TID Adonis Brook, NP   10 mg at 11/15/14 1725  . thiamine (B-1) injection 100 mg  100 mg Intravenous Daily Earney Navy, NP   100 mg at 11/13/14 0800  . thiamine (VITAMIN B-1) tablet 100 mg  100 mg Oral Daily Earney Navy, NP   100 mg at 11/15/14 8119  . zolpidem (AMBIEN) tablet 10 mg  10 mg Oral QHS PRN Rachael Fee, MD        Lab Results: No results found for this or any previous visit (from the past 48 hour(s)).  Physical Findings: AIMS:  Facial and Oral Movements Muscles of Facial Expression: None, normal Lips and Perioral Area: None, normal Jaw: None, normal Tongue: None, normal,Extremity Movements Upper (arms, wrists, hands, fingers): None, normal Lower (legs, knees, ankles, toes): None, normal, Trunk Movements Neck, shoulders, hips: None, normal, Overall  Severity Severity of abnormal movements (highest score from questions above): None, normal Incapacitation due to abnormal movements: None, normal Patient's awareness of abnormal movements (rate only patient's report): No Awareness, Dental Status Current problems with teeth and/or dentures?: No Does patient usually wear dentures?: No  CIWA:  CIWA-Ar Total: 3 COWS:  COWS Total Score: 3  Treatment Plan Summary: Daily contact with patient to assess and evaluate symptoms and progress in treatment and Medication management Supportive approach/copig skills Alcohol cocaine abuse; continue the Librium detox protocol Work a relapse prevention plan Mood instability; will continue to work with the Neurontin De[pression; will continue to work with the Paxil with Remeron augmentation Insomnia; trial with Ambien 10 mg HS PRN Nightmares; will continue the Prazosin 1 mg HS Explore residential treatment options Medical Decision Making:  Review of Psycho-Social Stressors (1), Review of Medication Regimen & Side Effects (2) and Review of New Medication or Change in Dosage (2)     Hakeen Shipes A 11/15/2014, 8:27 PM

## 2014-11-15 NOTE — Plan of Care (Signed)
Problem: Alteration in mood Goal: LTG-Patient reports reduction in suicidal thoughts (Patient reports reduction in suicidal thoughts and is able to verbalize a safety plan for whenever patient is feeling suicidal)  Outcome: Not Progressing Pt SI-contracts for safety  Problem: Alteration in mood & ability to function due to Goal: LTG-Pt reports reduction in suicidal thoughts (Patient reports reduction in suicidal thoughts and is able to verbalize a safety plan for whenever patient is feeling suicidal)  Outcome: Not Progressing Pt continues to endorse SI-but contracts for safety

## 2014-11-15 NOTE — Plan of Care (Signed)
Problem: Diagnosis: Increased Risk For Suicide Attempt Goal: STG-Patient Will Comply With Medication Regime Outcome: Progressing Patient is compliant with medications at this time.

## 2014-11-15 NOTE — Progress Notes (Signed)
Pt attended group this evening. 
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
## 2014-11-15 NOTE — [facility] Group Notes (Signed)
[facility] Group Notes:  (Nursing/MHT/Case Management/Adjunct)  Date:  11/15/2014  Time:  12:26 PM  Type of Therapy:  Nurse Education  Participation Level:  Active  Participation Quality:  Attentive  Affect:  Appropriate  Cognitive:  Alert  Insight:  Appropriate  Engagement in Group:  Engaged  Modes of Intervention:  Discussion and Education  Summary of Progress/Problems: The purpose of this group is to discuss Healthy support systems. Patient attended group and participated fully.   Marzetta BoardDopson, Jarquez Mestre E 11/15/2014, 12:26 PM

## 2014-11-16 MED ORDER — ZOLPIDEM TARTRATE 10 MG PO TABS
10.0000 mg | ORAL_TABLET | Freq: Every day | ORAL | Status: DC
  Administered 2014-11-16 – 2014-11-21 (×6): 10 mg via ORAL
  Filled 2014-11-16 (×5): qty 1

## 2014-11-16 NOTE — Progress Notes (Signed)
D: Patient alert and cooperative. Pt report goal is to get into a long term treatment preferably one she can smoke. Pt reports her current living situation is not suitable for her sobriety since her boyfriend is an addict. Pt endorses passive suicidal ideation and verbally contract to come to staff. Pt denies HI/AVH.    A: Medications administered as prescribed. Supported and encouragement provided. Emotional support given to talk to SW.   R: Patient remains safe and complaint with medications.

## 2014-11-16 NOTE — Progress Notes (Signed)
[facility]  11/16/2014 5:59 PM Alyssa Kelley  MRN:  [id number] Subjective:  Alyssa Kelley continues to try to get her life together. Continues to say that in order to stay clean she cant go back to the people she was staying with. States she is really committed to doing the work. She is still having problems with insomnia. She states she only got the Ambien last night without the Remeron. Slept until 4 AM. States she has not have nightmares. She would like to see what would happen is she could get both together Principal Problem: <principal problem not specified> Diagnosis:   Patient Active Problem List   Diagnosis Date Noted  . 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 Alcohol use disorder, severe, dependence [F10.20] 11/12/2014  . Heroin abuse [F11.10] 09/10/2013  . Alcohol abuse [F10.10] 09/10/2013  . Cocaine abuse [F14.10] 09/10/2013   Total Time spent with patient: 30 minutes   Past Medical History:  Past Medical History  Diagnosis Date  . Abscess of mouth   . Heroin abuse   . Anxiety   . Depression   . Hypertension    History reviewed. No pertinent past surgical history. Family History: History reviewed. No pertinent family history. Social History:  History  Alcohol Use  . Yes    Comment: one 12 pack qd     History  Drug Use  . Yes  . Special: Cocaine, Oxycodone, Heroin    History   Social History  . Marital Status: Single    Spouse Name: N/A  . Number of Children: N/A  . Years of Education: N/A   Social History Main Topics  . Smoking status: Current Every Day Smoker -- 1.00 packs/day for 15 years    Types: Cigarettes  . Smokeless tobacco: Not on file  . Alcohol Use: Yes     Comment: one 12 pack qd  . Drug Use: Yes    Special: Cocaine, Oxycodone, Heroin  . Sexual Activity: Yes    Birth Control/ Protection: Condom   Other Topics Concern  . None   Social History Narrative   Additional History:     Sleep: Poor  Appetite:  Fair   Assessment:   Musculoskeletal: Strength & Muscle Tone: within normal limits Gait & Station: normal Patient leans: N/A   Psychiatric Specialty Exam: Physical Exam  Review of Systems  Constitutional: Positive for malaise/fatigue.  HENT: Negative.   Eyes: Negative.   Respiratory: Negative.   Cardiovascular: Negative.   Gastrointestinal: Negative.   Genitourinary: Negative.   Musculoskeletal: Negative.   Skin: Negative.   Neurological: Negative.   Endo/Heme/Allergies: Negative.   Psychiatric/Behavioral: Positive for depression and substance abuse. The patient is nervous/anxious and has insomnia.     Blood pressure 129/73, pulse 75, temperature 98.6 F (37 C), temperature source Oral, resp. rate 18, height 5' (1.524 m), weight 93.441 kg (206 lb), last menstrual period 10/21/2014, SpO2 100 %.Body mass index is 40.23 kg/(m^2).  General Appearance: Fairly Groomed  Patent attorneyye Contact::  Fair  Speech:  Clear and Coherent  Volume:  Normal  Mood:  Anxious and wooried  Affect:  anxious worried  Thought Process:  Coherent and Goal Directed  Orientation:  Full (Time, Place, and Person)  Thought Content:  Rumination symptoms events worries concerns  Suicidal Thoughts:  No  Homicidal Thoughts:  No  Memory:  Immediate;   Fair Recent;   Fair Remote;   Fair  Judgement:  Fair  Insight:  Present  Psychomotor Activity:  Restlessness  Concentration:  Fair  Recall:  [employer] of Knowledge:Fair  Language: Fair  Akathisia:  No  Handed:  Right  AIMS (if indicated):     Assets:  Desire for Improvement  ADL's:  Intact  Cognition: WNL  Sleep:  Number of Hours: 4.75     Current Medications: Current Facility-Administered Medications  Medication Dose Route Frequency Provider Last Rate Last Dose  . alum & mag hydroxide-simeth (MAALOX/MYLANTA) 200-200-20 MG/5ML suspension 30 mL  30 mL Oral PRN Earney Navy, NP      . Melene Muller ON 11/17/2014]  chlordiazePOXIDE (LIBRIUM) capsule 25 mg  25 mg Oral Daily Rachael Fee, MD      . gabapentin (NEURONTIN) capsule 300 mg  300 mg Oral TID Rachael Fee, MD   300 mg at 11/16/14 1700  . gabapentin (NEURONTIN) capsule 300 mg  300 mg Oral QHS Rachael Fee, MD   300 mg at 11/15/14 2138  . hydrOXYzine (ATARAX/VISTARIL) tablet 25 mg  25 mg Oral Q6H PRN Earney Navy, NP   25 mg at 11/16/14 1204  . ibuprofen (ADVIL,MOTRIN) tablet 600 mg  600 mg Oral Q8H PRN Adonis Brook, NP   600 mg at 11/16/14 1346  . mirtazapine (REMERON) tablet 30 mg  30 mg Oral QHS Rachael Fee, MD   30 mg at 11/14/14 2158  . multivitamin with minerals tablet 1 tablet  1 tablet Oral Daily Rachael Fee, MD   1 tablet at 11/16/14 0759  . nicotine (NICODERM CQ - dosed in mg/24 hours) patch 21 mg  21 mg Transdermal Daily Rachael Fee, MD   21 mg at 11/16/14 0759  . ondansetron (ZOFRAN) tablet 4 mg  4 mg Oral Q8H PRN Earney Navy, NP      . PARoxetine (PAXIL) tablet 40 mg  40 mg Oral Daily Rachael Fee, MD   40 mg at 11/16/14 0758  . potassium chloride SA (K-DUR,KLOR-CON) CR tablet 20 mEq  20 mEq Oral Daily Earney Navy, NP   20 mEq at 11/16/14 0758  . prazosin (MINIPRESS) capsule 1 mg  1 mg Oral QHS Rachael Fee, MD   1 mg at 11/15/14 2138  . propranolol (INDERAL) tablet 10 mg  10 mg Oral TID Adonis Brook, NP   10 mg at 11/16/14 1700  . thiamine (B-1) injection 100 mg  100 mg Intravenous Daily Earney Navy, NP   100 mg at 11/13/14 0800  . thiamine (VITAMIN B-1) tablet 100 mg  100 mg Oral Daily Earney Navy, NP   100 mg at 11/16/14 0758  . zolpidem (AMBIEN) tablet 10 mg  10 mg Oral QHS Rachael Fee, MD        Lab Results: No results found for this or any previous visit (from the past 48 hour(s)).  Physical Findings: AIMS: Facial and Oral Movements Muscles of Facial Expression: None, normal Lips and Perioral Area: None, normal Jaw: None, normal Tongue: None, normal,Extremity Movements Upper  (arms, wrists, hands, fingers): None, normal Lower (legs, knees, ankles, toes): None, normal, Trunk Movements Neck, shoulders, hips: None, normal, Overall Severity Severity of abnormal movements (highest score from questions above): None, normal Incapacitation due to abnormal movements: None, normal Patient's awareness of abnormal movements (rate only patient's report): No Awareness, Dental Status Current problems with teeth and/or dentures?: No Does patient usually wear dentures?: No  CIWA:  CIWA-Ar Total: 4 COWS:  COWS Total Score: 3  Treatment Plan Summary: Daily contact with patient to assess and evaluate symptoms and progress in treatment and Medication management Supportive approach/coping skills Polysubstance dependence; complete the Librium detox protocol Depression; continue to optimize response to the Paxil 40 mg daily Insomnia; try the Ambien and the Remeron together tonight Nightmares; continue the Prazosin 1 mg  Explore residential treatment options  Medical Decision Making:  Review of Psycho-Social Stressors (1), Review of Medication Regimen & Side Effects (2) and Review of New Medication or Change in Dosage (2)     Jarron Curley A 11/16/2014, 5:59 PM

## 2014-11-16 NOTE — [facility] Group Notes (Signed)
[facility]  11/16/2014 8:45 AM  Participation Quality: Alert, Appropriate and Oriented  Mood/Affect: Appropriate  Depression Rating: 9  Anxiety Rating: 9  Thoughts of Suicide: Pt endorses passive SI  Will you contract for safety? Yes  Current AVH: Pt denies  Plan for Discharge/Comments: Pt attended discharge planning group and actively participated in group. CSW provided pt with today's workbook. Pt participated appropriately in group discussion, indicating her desire to participate in residential substance abuse treatment after discharge. Pt also describes her need to create a new support system as her friends and boyfriend all abuse drugs.   Transportation Means: Pt reports access to transportation  Supports: No supports mentioned at this time  Chad CordialLauren Carter, LCSWA 11/16/2014 12:02 PM

## 2014-11-16 NOTE — Plan of Care (Signed)
Problem: Alteration in mood Goal: STG-Patient reports thoughts of self-harm to staff Outcome: Progressing Patient reports passive SI however patient is able to contract for safety and come to staff if feeling unsafe.

## 2014-11-16 NOTE — Progress Notes (Signed)
Patient ID: Wynelle BeckmannLeigh N Leedom, female   DOB: [birth year], 44 y.o.   MRN: [id number]  DAR: Pt. Denies HI and A/V Hallucinations. Patient reports passive SI but states they are not as bad today. Patient is able to contract for safety. Patient reports pain in face and states it is mostly sore. Patient is receiving Ibuprofen when able for this. Patient reports it is feeling better than yesterday. Patient continues to report withdrawals and is receiving Librium for this. Support and encouragement provided to the patient. Scheduled medications administered to patient per physician's orders. Patient is receptive and cooperative. Patient is pleasant when interacting with staff and peers. Patient is frequently seen in the milieu and is attending groups. Patient appears vested in her plan of care. Q15 minute checks are maintained for safety.

## 2014-11-16 NOTE — Progress Notes (Signed)
D: Patient alert and cooperative. Pt appeared sociable and seemed to get along with peers. Pt mood and appropriate to situation. Pt endorses passive suicidal ideation and verbally contract to come to staff. Pt denies HI/AVH.    A: Medications administered as prescribed. Supported and encouragement provided.   R: Patient remains safe and complaint with medications.

## 2014-11-17 MED ORDER — CHLORDIAZEPOXIDE HCL 25 MG PO CAPS
25.0000 mg | ORAL_CAPSULE | Freq: Four times a day (QID) | ORAL | Status: DC | PRN
  Administered 2014-11-17 – 2014-11-22 (×16): 25 mg via ORAL
  Filled 2014-11-17 (×16): qty 1

## 2014-11-17 NOTE — Plan of Care (Signed)
Problem: Alteration in mood & ability to function due to Goal: STG-Patient will comply with prescribed medication regimen (Patient will comply with prescribed medication regimen)  Outcome: Progressing Pt compliant with prescribed medication regime.

## 2014-11-17 NOTE — Progress Notes (Signed)
D: Patient alert and cooperative. Pt reports feeling anxious, abdominal cramping and pain. Pt visible on unit and interacting well with peers. Pt mood and affect is anxious. Pt endorses passive suicidal ideation and verbally contract to come to staff. Pt denies HI/AVH.    A: Met with pt 1:1.Medications administered as prescribed. Supported and encouragement provided.   R: Patient remains safe and receptive to education. Pt complaint with medications.

## 2014-11-17 NOTE — Progress Notes (Signed)
CSW spoke with [facility] at [facility] who reported that a bed was available to Pt tomorrow. CSW discussed bed availability with Pt. However, Pt reports that she does not want to accept the bed as she would not be allowed to smoke cigarettes at the facility. [facility]. She states that she can live with her aunt until [facility]  Chad CordialLauren Carter, Theresia MajorsLCSWA [phone]

## 2014-11-17 NOTE — Plan of Care (Signed)
Problem: Ineffective individual coping Goal: STG-Increase in ability to manage activities of daily living Outcome: Progressing Patient is able to manage ADL's appropriately

## 2014-11-17 NOTE — Progress Notes (Signed)
Patient ID: Alyssa Kelley, female   DOB: [birth year], 44 y.o.   MRN: [id number]  DAR: Pt. Denies HI and A/V Hallucinations Patient continues to endorse passive SI but reports she is getting better. Patient is able to contract for safety and denies any plan. She reports sleep was good last night, appetite is good, energy level is low, and concentration is good. Patient rates her anxiety and hopelessness at 10/10 and depression at 9/10. Patient reports facial and mouth pain which she received PRN Ibuprofen. This provided relief to patient on reassessment. Support and encouragement provided to the patient. Scheduled medications administered to patient per physician's orders. PRN medications provided to patient to decrease anxiety. Patient is receptive and cooperative. Patient is seen in the milieu attending groups and interacting with peers. Patient reports that she is actively trying to find additional treatment after discharge.  Q15 minute checks are maintained for safety.

## 2014-11-17 NOTE — Tx Team (Signed)
Interdisciplinary Treatment Plan Update (Adult) Date: 11/13/2014   Date: 11/13/2014 9:58 AM  Progress in Treatment:  Attending groups: Yes  Participating in groups: Yes  Taking medication as prescribed: Yes  Tolerating medication: Yes  Family/Significant othe contact made: No, CSW continues to attempt to make contact with Pt's boyfriend.  Patient understands diagnosis: Yes AEB her ability to discuss her depression/substance abuse and willingness to seek treatment. Discussing patient identified problems/goals with staff: Yes  Medical problems stabilized or resolved: Yes  Denies suicidal/homicidal ideation: No, Pt endorses SI but will contract for safety.  Patient has not harmed self or Others: Yes   New problem(s) identified:   Discharge Plan or Barriers: 11/13/2014: Pt is requesting residential treatment for substance abuse. CSW to refer Pt to appropriate facilities. Pt is currently homeless and has been staying with different people in the area.   11/17/14: Pt continues to request residential treatment. [facility]   Additional comments:  Patient is a 44yo year old Caucasian female with diagnoses of Alcohol Use Disorder, severe, Heroine abuse, alcohol abuse, cocaine abuse, and Major Depressive Disorder, recurrent, severe. Pt reports getting into a fight with a friend prior to admission due to substance-related issues. She reports that her feelings of hopelessness are overwhelming and that this is the first time she has experienced SI. Pt has a long history of substance abuse beginning at age 44. At that time, her father had been murdered and her mother died of cirrhosis of the liver. Pt reports that both her parents abused substances as well. Pt identifies her drug of choice as heroine; however at this time she is using multiple substances such as heroine, methadone, benzodiazepines, opiates, and alcohol. Pt was released from jail one month ago and  relapsed upon release. Since that time she has been staying with friends and using. She is motivated for treatment and is requesting residential treatment.    Reason for Continuation of Hospitalization:  Anxiety Depression Suicidal ideation Withdrawal symptoms  Estimated length of stay: 3-5 days  For review of initial/current patient goals, please see plan of care.   Attendees:  Patient:    Family:    Physician: Dr. Dub MikesLugo MD  11/17/2014 8:19 AM  Nursing: Onnie BoerJennifer Clark, RN Case manager  11/17/2014 8:19 AM  Clinical Social Worker Chad CordialLauren Carter, Theresia MajorsLCSWA,  11/17/2014 8:19 AM  Other: Leisa LenzValerie Enoch, Vesta MixerMonarch Liasion 11/17/2014 8:19 AM  Clinical: Pura SpiceMary Freidman, RN 11/17/2014 8:19 AM  Other: , RN Charge Nurse 11/17/2014 8:19 AM  Other: Belenda CruiseKristin Drinkard, LCSWA; WashingtonQuylle Hodnett, LCSW     Lamar SprinklesLauren Carter, LCSWA MSW

## 2014-11-17 NOTE — Progress Notes (Signed)
Per Pt request, referral made to [facility]. Alyssa Kelley, at [facility], states that they are operating on a 2-3 week waitlist. Pt is aware.   Alyssa Kelley, LCSWA [phone] 

## 2014-11-17 NOTE — Progress Notes (Signed)
[facility] MD Progress Note  11/17/2014 5:15 PM Alyssa Kelley  MRN:  [id number] Subjective:  Alyssa Kelley states she is concerned about her weight gain. She knows that Paxil can cause weight gain. She has done well with the Paxil and has had withdrawal when she does not take it. So she will like to address this later. She is also aware that Remeron can increase the weight too but it is helping her sleep. She also knows that alcohol provides a lot of empty calories and now that she is going to stop drinking some of that weight will come off  Principal Problem: <principal problem not specified> Diagnosis:   Patient Active Problem List   Diagnosis Date Noted  . 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 Alcohol use disorder, severe, dependence [F10.20] 11/12/2014  . Heroin abuse [F11.10] 09/10/2013  . Alcohol abuse [F10.10] 09/10/2013  . Cocaine abuse [F14.10] 09/10/2013   Total Time spent with patient: 30 minutes   Past Medical History:  Past Medical History  Diagnosis Date  . Abscess of mouth   . Heroin abuse   . Anxiety   . Depression   . Hypertension    History reviewed. No pertinent past surgical history. Family History: History reviewed. No pertinent family history. Social History:  History  Alcohol Use  . Yes    Comment: one 12 pack qd     History  Drug Use  . Yes  . Special: Cocaine, Oxycodone, Heroin    History   Social History  . Marital Status: Single    Spouse Name: N/A  . Number of Children: N/A  . Years of Education: N/A   Social History Main Topics  . Smoking status: Current Every Day Smoker -- 1.00 packs/day for 15 years    Types: Cigarettes  . Smokeless tobacco: Not on file  . Alcohol Use: Yes     Comment: one 12 pack qd  . Drug Use: Yes    Special: Cocaine, Oxycodone, Heroin  . Sexual Activity: Yes    Birth Control/ Protection: Condom   Other Topics Concern  . None   Social History Narrative   Additional  History:    Sleep: Fair  Appetite:  Fair   Assessment:   Musculoskeletal: Strength & Muscle Tone: within normal limits Gait & Station: normal Patient leans: normal   Psychiatric Specialty Exam: Physical Exam  Review of Systems  Constitutional: Negative.   HENT: Negative.   Eyes: Negative.   Respiratory: Negative.   Cardiovascular: Negative.   Gastrointestinal: Negative.   Genitourinary: Negative.   Musculoskeletal: Negative.   Skin: Negative.   Neurological: Negative.   Endo/Heme/Allergies: Negative.   Psychiatric/Behavioral: Positive for depression and substance abuse. The patient is nervous/anxious.     Blood pressure 106/80, pulse 80, temperature 98.6 F (37 C), temperature source Oral, resp. rate 16, height 5' (1.524 m), weight 93.441 kg (206 lb), last menstrual period 10/21/2014, SpO2 100 %.Body mass index is 40.23 kg/(m^2).  General Appearance: Fairly Groomed  Patent attorney::  Fair  Speech:  Clear and Coherent  Volume:  Normal  Mood:  Anxious  Affect:  anxious worried  Thought Process:  Coherent and Goal Directed  Orientation:  Full (Time, Place, and Person)  Thought Content:  symptoms events worries concerns  Suicidal Thoughts:  No  Homicidal Thoughts:  No  Memory:  Immediate;   Fair Recent;   Fair Remote;   Fair  Judgement:  Fair  Insight:  Present  Psychomotor Activity:  Restlessness  Concentration:  Fair  Recall:  FiservFair  Fund of Knowledge:Fair  Language: Fair  Akathisia:  No  Handed:  Right  AIMS (if indicated):     Assets:  Desire for Improvement  ADL's:  Intact  Cognition: WNL  Sleep:  Number of Hours: 5     Current Medications: Current Facility-Administered Medications  Medication Dose Route Frequency Provider Last Rate Last Dose  . alum & mag hydroxide-simeth (MAALOX/MYLANTA) 200-200-20 MG/5ML suspension 30 mL  30 mL Oral PRN Earney NavyJosephine C Onuoha, NP   30 mL at 11/16/14 1810  . chlordiazePOXIDE (LIBRIUM) capsule 25 mg  25 mg Oral QID PRN  Rachael FeeIrving A Aaronmichael Brumbaugh, MD   25 mg at 11/17/14 1306  . gabapentin (NEURONTIN) capsule 300 mg  300 mg Oral TID Rachael FeeIrving A Berenize Gatlin, MD   300 mg at 11/17/14 1656  . gabapentin (NEURONTIN) capsule 300 mg  300 mg Oral QHS Rachael FeeIrving A Coreen Shippee, MD   300 mg at 11/16/14 2154  . hydrOXYzine (ATARAX/VISTARIL) tablet 25 mg  25 mg Oral Q6H PRN Earney NavyJosephine C Onuoha, NP   25 mg at 11/16/14 1809  . ibuprofen (ADVIL,MOTRIN) tablet 600 mg  600 mg Oral Q8H PRN Adonis BrookSheila Agustin, NP   600 mg at 11/17/14 1528  . mirtazapine (REMERON) tablet 30 mg  30 mg Oral QHS Rachael FeeIrving A Meryle Pugmire, MD   30 mg at 11/16/14 2154  . multivitamin with minerals tablet 1 tablet  1 tablet Oral Daily Rachael FeeIrving A Rubye Strohmeyer, MD   1 tablet at 11/17/14 [phone]  . nicotine (NICODERM CQ - dosed in mg/24 hours) patch 21 mg  21 mg Transdermal Daily Rachael FeeIrving A Akiko Schexnider, MD   21 mg at 11/17/14 0755  . ondansetron (ZOFRAN) tablet 4 mg  4 mg Oral Q8H PRN Earney NavyJosephine C Onuoha, NP      . PARoxetine (PAXIL) tablet 40 mg  40 mg Oral Daily Rachael FeeIrving A Fatisha Rabalais, MD   40 mg at 11/17/14 0753  . potassium chloride SA (K-DUR,KLOR-CON) CR tablet 20 mEq  20 mEq Oral Daily Earney NavyJosephine C Onuoha, NP   20 mEq at 11/17/14 0753  . prazosin (MINIPRESS) capsule 1 mg  1 mg Oral QHS Rachael FeeIrving A Mattheo Swindle, MD   1 mg at 11/16/14 2154  . propranolol (INDERAL) tablet 10 mg  10 mg Oral TID Adonis BrookSheila Agustin, NP   10 mg at 11/17/14 1656  . thiamine (B-1) injection 100 mg  100 mg Intravenous Daily Earney NavyJosephine C Onuoha, NP   100 mg at 11/13/14 0800  . thiamine (VITAMIN B-1) tablet 100 mg  100 mg Oral Daily Earney NavyJosephine C Onuoha, NP   100 mg at 11/17/14 0753  . zolpidem (AMBIEN) tablet 10 mg  10 mg Oral QHS Rachael FeeIrving A Roark Rufo, MD   10 mg at 11/16/14 2154    Lab Results: No results found for this or any previous visit (from the past 48 hour(s)).  Physical Findings: AIMS: Facial and Oral Movements Muscles of Facial Expression: None, normal Lips and Perioral Area: None, normal Jaw: None, normal Tongue: None, normal,Extremity Movements Upper (arms, wrists,  hands, fingers): None, normal Lower (legs, knees, ankles, toes): None, normal, Trunk Movements Neck, shoulders, hips: None, normal, Overall Severity Severity of abnormal movements (highest score from questions above): None, normal Incapacitation due to abnormal movements: None, normal Patient's awareness of abnormal movements (rate only patient's report): No Awareness, Dental Status Current problems with teeth and/or dentures?: No Does patient usually wear dentures?: No  CIWA:  CIWA-Ar Total: 2  COWS:  COWS Total Score: 3  Treatment Plan Summary: Daily contact with patient to assess and evaluate symptoms and progress in treatment and Medication management Supportive approach/coping skills Depression; will continue the Paxil and use the Remeron for augmentation Insomnia; will continue to use the Remeron for sleep Nightmares; will continue to use the Prazosin for the nightmares Mood instability/anxiety; continue to use the Neurontin Work with CBT/mindfulness Medical Decision Making:  Review of Psycho-Social Stressors (1), Review of Medication Regimen & Side Effects (2) and Review of New Medication or Change in Dosage (2)     Jahmir Salo A 11/17/2014, 5:15 PM

## 2014-11-17 NOTE — [facility] Group Notes (Signed)
[facility] Group Therapy 11/17/2014 1:15pm  Type of Therapy: Mental Health Association Presentation  Participation Level: Active  Participation Quality: Attentive  Affect: Appropriate  Cognitive: Oriented  Insight: Developing/Improving  Engagement in Therapy: Engaged  Modes of Intervention: Discussion, Education and Socialization  Summary of Progress/Problems: Mental Health Association (MHA) Speaker came to talk about his personal journey with substance abuse and addiction. The pt processed ways by which to relate to the speaker. MHA speaker provided handouts and educational information pertaining to groups and services offered by the [facility]. Pt participated appropriately, was attentive and engaged during discussion.  She verbally and nonverbally expressed agreement with speaker's statements. Pt left group but returned towards the end of the group. Pt was receptive to resources provided by speaker.    Chad CordialLauren Carter, LCSWA 11/17/2014 1:33 PM

## 2014-11-17 NOTE — [facility] Group Notes (Signed)
[facility] Group Notes:  (Nursing/MHT/Case Management/Adjunct)  Date:  11/17/2014  Time:  10:12 AM  Type of Therapy:  Nurse Education  Participation Level:  Active  Participation Quality:  Appropriate  Affect:  Anxious  Cognitive:  Appropriate  Insight:  Improving  Engagement in Group:  Engaged  Modes of Intervention:  Discussion and Education  Summary of Progress/Problems: The topic of discussion today was Recovery. Patient's were encouraged to focus on sleep hygiene, gaining and utilizing coping skills. Patient came to group and participated fully. Patient verbalized the importance of safety and stated that it is important to watch her blood pressure and pulse when she is receiving her Inderal.   Alyssa Kelley E 11/17/2014, 10:12 AM

## 2014-11-17 NOTE — Progress Notes (Signed)
Recreation Therapy Notes  Animal-Assisted Activity (AAA) Program Checklist/Progress Notes Patient Eligibility Criteria Checklist & Daily Group note for Rec Tx Intervention  Date: 05.31.16 Time: 2:30pm [address]   AAA/T Program Assumption of Risk Form signed by Patient/ or Parent Legal Guardian yes  Patient is free of allergies or sever asthma yes  Patient reports no fear of animals yes  Patient reports no history of cruelty to animalsyes  Patient understands his/her participation is voluntary yes  Patient washes hands before animal contact yes  Patient washes hands after animal contact yes  Behavioral Response: Engaged  Education: Charity fundraiserHand Washing, Appropriate Animal Interaction   Education Outcome: Acknowledges understanding/In group clarification offered/Needs additional education.   Clinical Observations/Feedback: Patient asked questions and pet the dog.   Alyssa Kelley, Alyssa Kelley         Alyssa RancherLindsay, Alyssa Kelley 11/17/2014 4:39 PM

## 2014-11-18 DIAGNOSIS — F419 Anxiety disorder, unspecified: Secondary | ICD-10-CM

## 2014-11-18 NOTE — Progress Notes (Signed)
Recreation Therapy Notes  Date: 06.01.16 Time: 9:30am Location: [address]  Group Topic: Stress Management  Goal Area(s) Addresses:  Patient will verbalize importance of using healthy stress management.  Patient will identify positive emotions associated with healthy stress management.    Behavioral Response:  Engaged  Intervention: [place] Guided Imagery Script  Activity : Guided Imagery Script. LRT introduced and educated patients on stress management technique of guided imagery.  Kelley script was used to deliver the technique to patients.  Patients were asked to follow script read by LRT to engage in practicing stress management technique.   Education:  Stress Management, Discharge Planning.   Education Outcome: Acknowledges edcuation/In group clarification offered/Needs additional education  Clinical Observations/Feedback: Patient attended group and was engaged.  Alyssa Kelley, LRT/CTRS         Alyssa Kelley 11/18/2014 3:35 PM 

## 2014-11-18 NOTE — Progress Notes (Signed)
[facility]  11/18/2014 4:43 PM Alyssa Kelley  MRN:  [id number] Subjective:  Alyssa Kelley is feeling scared as no place has been identified where she can go to pursue further work on her addiction and depression. She states she is afraid of relapsing  The only place she has to stay to wait for a bed is with a family member and there use heroin there. She is not sure if she is going to be able to make it if she was to be around it. She admits she has been doing better until today when it hit her that she might not have a safe place to go to Principal Problem: <principal problem not specified> Diagnosis:   Patient Active Problem List   Diagnosis Date Noted  . 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 Alcohol use disorder, severe, dependence [F10.20] 11/12/2014  . Heroin abuse [F11.10] 09/10/2013  . Alcohol abuse [F10.10] 09/10/2013  . Cocaine abuse [F14.10] 09/10/2013   Total Time spent with patient: 30 minutes   Past Medical History:  Past Medical History  Diagnosis Date  . Abscess of mouth   . Heroin abuse   . Anxiety   . Depression   . Hypertension    History reviewed. No pertinent past surgical history. Family History: History reviewed. No pertinent family history. Social History:  History  Alcohol Use  . Yes    Comment: one 12 pack qd     History  Drug Use  . Yes  . Special: Cocaine, Oxycodone, Heroin    History   Social History  . Marital Status: Single    Spouse Name: N/A  . Number of Children: N/A  . Years of Education: N/A   Social History Main Topics  . Smoking status: Current Every Day Smoker -- 1.00 packs/day for 15 years    Types: Cigarettes  . Smokeless tobacco: Not on file  . Alcohol Use: Yes     Comment: one 12 pack qd  . Drug Use: Yes    Special: Cocaine, Oxycodone, Heroin  . Sexual Activity: Yes    Birth Control/ Protection: Condom   Other Topics Concern  . None   Social History Narrative    Additional History:    Sleep: Fair  Appetite:  Fair   Assessment:   Musculoskeletal: Strength & Muscle Tone: within normal limits Gait & Station: normal Patient leans: normal   Psychiatric Specialty Exam: Physical Exam  Review of Systems  Constitutional: Negative.   HENT: Negative.   Eyes: Negative.   Respiratory: Negative.   Cardiovascular: Negative.   Gastrointestinal: Negative.   Genitourinary: Negative.   Musculoskeletal: Negative.   Skin: Negative.   Neurological: Negative.   Endo/Heme/Allergies: Negative.   Psychiatric/Behavioral: Positive for depression and substance abuse. The patient is nervous/anxious.     Blood pressure 108/61, pulse 78, temperature 98.2 F (36.8 C), temperature source Oral, resp. rate 16, height 5' (1.524 m), weight 93.441 kg (206 lb), last menstrual period 10/21/2014, SpO2 100 %.Body mass index is 40.23 kg/(m^2).  General Appearance: Fairly Groomed  Patent attorneyye Contact::  Fair  Speech:  Clear and Coherent  Volume:  Normal  Mood:  Anxious and Depressed  Affect:  Depressed and anxious worried  Thought Process:  Coherent and Goal Directed  Orientation:  Full (Time, Place, and Person)  Thought Content:  symptoms events worries concerns  Suicidal Thoughts:  passive  Homicidal Thoughts:  No  Memory:  Immediate;   Fair Recent;  Fair Remote;   Fair  Judgement:  Fair  Insight:  Present and Shallow  Psychomotor Activity:  Restlessness  Concentration:  Fair  Recall:  [employer] of Knowledge:Fair  Language: Fair  Akathisia:  No  Handed:  Right  AIMS (if indicated):     Assets:  Desire for Improvement  ADL's:  Intact  Cognition: WNL  Sleep:  Number of Hours: 5.5     Current Medications: Current Facility-Administered Medications  Medication Dose Route Frequency Provider Last Rate Last Dose  . alum & mag hydroxide-simeth (MAALOX/MYLANTA) 200-200-20 MG/5ML suspension 30 mL  30 mL Oral PRN Earney Navy, NP   30 mL at 11/16/14 1810  .  chlordiazePOXIDE (LIBRIUM) capsule 25 mg  25 mg Oral QID PRN Rachael Fee, MD   25 mg at 11/18/14 1421  . gabapentin (NEURONTIN) capsule 300 mg  300 mg Oral TID Rachael Fee, MD   300 mg at 11/18/14 1634  . gabapentin (NEURONTIN) capsule 300 mg  300 mg Oral QHS Rachael Fee, MD   300 mg at 11/17/14 2138  . hydrOXYzine (ATARAX/VISTARIL) tablet 25 mg  25 mg Oral Q6H PRN Earney Navy, NP   25 mg at 11/18/14 1636  . ibuprofen (ADVIL,MOTRIN) tablet 600 mg  600 mg Oral Q8H PRN Adonis Brook, NP   600 mg at 11/18/14 0809  . mirtazapine (REMERON) tablet 30 mg  30 mg Oral QHS Rachael Fee, MD   30 mg at 11/17/14 2138  . multivitamin with minerals tablet 1 tablet  1 tablet Oral Daily Rachael Fee, MD   1 tablet at 11/18/14 0810  . nicotine (NICODERM CQ - dosed in mg/24 hours) patch 21 mg  21 mg Transdermal Daily Rachael Fee, MD   21 mg at 11/18/14 0813  . ondansetron (ZOFRAN) tablet 4 mg  4 mg Oral Q8H PRN Earney Navy, NP      . PARoxetine (PAXIL) tablet 40 mg  40 mg Oral Daily Rachael Fee, MD   40 mg at 11/18/14 0810  . potassium chloride SA (K-DUR,KLOR-CON) CR tablet 20 mEq  20 mEq Oral Daily Earney Navy, NP   20 mEq at 11/18/14 0810  . prazosin (MINIPRESS) capsule 1 mg  1 mg Oral QHS Rachael Fee, MD   1 mg at 11/17/14 2138  . propranolol (INDERAL) tablet 10 mg  10 mg Oral TID Adonis Brook, NP   10 mg at 11/18/14 1634  . thiamine (VITAMIN B-1) tablet 100 mg  100 mg Oral Daily Earney Navy, NP   100 mg at 11/18/14 0810  . zolpidem (AMBIEN) tablet 10 mg  10 mg Oral QHS Rachael Fee, MD   10 mg at 11/17/14 2138    Lab Results: No results found for this or any previous visit (from the past 48 hour(s)).  Physical Findings: AIMS: Facial and Oral Movements Muscles of Facial Expression: None, normal Lips and Perioral Area: None, normal Jaw: None, normal Tongue: None, normal,Extremity Movements Upper (arms, wrists, hands, fingers): None, normal Lower (legs, knees,  ankles, toes): None, normal, Trunk Movements Neck, shoulders, hips: None, normal, Overall Severity Severity of abnormal movements (highest score from questions above): None, normal Incapacitation due to abnormal movements: None, normal Patient's awareness of abnormal movements (rate only patient's report): No Awareness, Dental Status Current problems with teeth and/or dentures?: No Does patient usually wear dentures?: No  CIWA:  CIWA-Ar Total: 3 COWS:  COWS Total Score: 3  Treatment Plan Summary: Daily contact with patient to assess and evaluate symptoms and progress in treatment and Medication management Supportive approach/coping skills Polysubstance Dependence; detox completed work a relapse prevention plan Depression ; continue the Remeron/Paxil combination Anxiety/mood instability; continue the Neurontin  Sleep continue the Remeron the Ambien Continue to explore residential treatment options CBT/mindfulness Medical Decision Making:  Review of Psycho-Social Stressors (1), Review of Medication Regimen & Side Effects (2) and Review of New Medication or Change in Dosage (2)     Ezequias Lard A 11/18/2014, 4:43 PM

## 2014-11-18 NOTE — Progress Notes (Signed)
[facility] Group Notes:  (Nursing/MHT/Case Management/Adjunct)  Date:  11/18/2014  Time:  2100  Type of Therapy:  wrap up group   Participation Level:  Active  Participation Quality:  Appropriate, Attentive, Sharing and Supportive  Affect:  Appropriate and Excited  Cognitive:  Appropriate  Insight:  Improving  Engagement in Group:  Engaged  Modes of Intervention:  Clarification, Education and Support  Summary of Progress/Problems:Pt shares that she was suicidal, using drugs, family using drugs, and friends using drugs.  Pt reports having been to multiple treatment facilities, multiple times.  Pt shares that she is 44 years old and must stop this. She has never changed her people, places, and things. Pt reports knowing she must do this but she knows no one clean. [facility] and does not know where to go if discharged before then. Pt says she will not go home, last resort would be a shelter over returning home.   Shelah LewandowskySquires, Uzziah Rigg Carol 11/18/2014, 10:08 PM

## 2014-11-18 NOTE — Plan of Care (Signed)
Problem: Alteration in mood Goal: STG-Patient is able to discuss feelings and issues (Patient is able to discuss feelings and issues leading to depression)  Outcome: Progressing Pt reports feeling anxious, medication given. Pt reports relief

## 2014-11-18 NOTE — [facility] Group Notes (Signed)
[facility] LCSW Group Therapy 11/18/2014 1:15 PM  Type of Therapy: Group Therapy- Emotion Regulation  Participation Level: Active   Participation Quality:  Appropriate  Affect: Appropriate  Cognitive: Alert and Oriented   Insight:  Developing/Improving  Engagement in Therapy: Developing/Improving and Engaged   Modes of Intervention: Clarification, Confrontation, Discussion, Education, Exploration, Limit-setting, Orientation, Problem-solving, Rapport Building, Dance movement psychotherapisteality Testing, Socialization and Support  Summary of Progress/Problems: The topic for group today was emotional regulation. This group focused on both positive and negative emotion identification and allowed group members to process ways to identify feelings, regulate negative emotions, and find healthy ways to manage internal/external emotions. Group members were asked to reflect on a time when their reaction to an emotion led to a negative outcome and explored how alternative responses using emotion regulation would have benefited them. Group members were also asked to discuss a time when emotion regulation was utilized when a negative emotion was experienced. Pt continues to participate openly in group. She discussed grief as a difficult emotion for her to regulate as she feels she has unresolved grief from relatives who are deceased. Pt also discussed the decision to enter recovery as a grieving process and using drugs has been a coping mechanism since age 44. Pt was able to work with the group to identify effective strategies to regulating her emotions such as building a support group, listening to music, and correctly identifying which emotion she is feeling.   Alyssa CordialLauren Kelley, LCSWA 11/18/2014 5:45 PM

## 2014-11-18 NOTE — Progress Notes (Signed)
D: Pt reports that she is worried that she may relapse as she waits for a bed. She reports that there is no safe place for her to stay until her appt with  [facility] on 11/26/14. She states that her family partakes in Substance abuse as well. She reports that she has been actively contacting facilities and will continue to do so on 11/19/14. Pt is currently passive for SI. Pt verbally contracts for safety. " I feel safe here". Pt is negative for any HI/AVH. Pt is visible and active within the milieu. Pt presents anxious in affect and mood.  A: Writer administered scheduled and prn medications to pt. Continued support and availability as needed was extended to this pt. Staff continue to monitor pt with q5315min checks.  R: No adverse drug reactions noted. Pt receptive to treatment. Pt remains safe at this time.

## 2014-11-18 NOTE — Progress Notes (Signed)
D: Patient denies SI/HI and A/V hallucinations; patient reports sleep is fair; reports appetite is fair; reports energy level is low ; reports ability to concentrate is poor; rates depression as 10/10; rates hopelessness 10/10; rates anxiety as 10/10; patient complains of anxiety  A: Monitored q 15 minutes; patient encouraged to attend groups; patient educated about medications; patient given medications per physician orders; patient encouraged to express feelings and/or concerns  R: Patient is cooperative; patient does appear to have some anxiety and is tearful; patient is focused on discharge plans; patient's interaction with staff and peers is appropriate; patient was able to set goal to talk with staff 1:1 when having feelings of SI; patient is taking medications as prescribed and tolerating medications

## 2014-11-19 MED ORDER — THIAMINE HCL 100 MG PO TABS: 100.0000 mg | ORAL_TABLET | Freq: Every day | ORAL | Status: DC

## 2014-11-19 MED ORDER — POTASSIUM CHLORIDE CRYS ER 20 MEQ PO TBCR: 20.0000 meq | EXTENDED_RELEASE_TABLET | Freq: Every day | ORAL | Status: DC

## 2014-11-19 MED ORDER — PRAZOSIN HCL 1 MG PO CAPS: 1.0000 mg | ORAL_CAPSULE | Freq: Every day | ORAL | Status: DC

## 2014-11-19 MED ORDER — GABAPENTIN 300 MG PO CAPS: ORAL_CAPSULE | ORAL | Status: DC

## 2014-11-19 MED ORDER — HYDROXYZINE HCL 25 MG PO TABS: 25.0000 mg | ORAL_TABLET | Freq: Four times a day (QID) | ORAL | Status: DC | PRN

## 2014-11-19 MED ORDER — MIRTAZAPINE 30 MG PO TABS: 30.0000 mg | ORAL_TABLET | Freq: Every day | ORAL | Status: DC

## 2014-11-19 MED ORDER — PROPRANOLOL HCL 10 MG PO TABS: 10.0000 mg | ORAL_TABLET | Freq: Three times a day (TID) | ORAL | Status: DC

## 2014-11-19 MED ORDER — NICOTINE 21 MG/24HR TD PT24: 21.0000 mg | MEDICATED_PATCH | Freq: Every day | TRANSDERMAL | Status: DC

## 2014-11-19 MED ORDER — PAROXETINE HCL 40 MG PO TABS: 40.0000 mg | ORAL_TABLET | Freq: Every day | ORAL | Status: DC

## 2014-11-19 NOTE — Progress Notes (Signed)
D: Pt denies SI/HI/AV. Pt is pleasant and cooperative. Pt rates depression at a 9, anxiety at a 9, and Helplessness/hopelessness at a 8.  A: Pt was offered support and encouragement. Pt was given scheduled medications. Pt was encourage to attend groups. Q 15 minute checks were done for safety.  R:Pt attends groups and interacts well with peers and staff. Pt taking medication. Pt has no complaints.Pt receptive to treatment and safety maintained on unit.

## 2014-11-19 NOTE — [facility] Group Notes (Signed)
[facility] LCSW Group Therapy 11/19/2014 1:15pm  Type of Therapy: Group Therapy- Balance in Life  Participation Level: Minimal   Description of the Group:  The topic for group was balance in life. Today's group focused on defining balance in one's own words, identifying things that can knock one off balance, and exploring healthy ways to maintain balance in life. Group members were asked to provide an example of a time when they felt off balance, describe how they handled that situation,and process healthier ways to regain balance in the future. Group members were asked to share the most important tool for maintaining balance that they learned [facility] and how they plan to apply this method after discharge.  Summary of Patient Progress Pt participated minimally in group and was withdrawn and inattentive. Pt affect was flat and she expressed that she is concerned about discharge as she has nowhere safe to go that will not cause her to relapse. Pt unable to verbalize plan to avoid relapse. Pt only participated when prompted and even then answered vaguely and appeared distracted.     Therapeutic Modalities:   Cognitive Behavioral Therapy Solution-Focused Therapy Assertiveness Training   Chad CordialLauren Carter, LCSWA 11/19/2014 4:11 PM

## 2014-11-19 NOTE — Progress Notes (Signed)
D: Pt presents anxious in affect and mood. Pt is currently passive for SI. Pt verbally contracts for safety. Pt is negative for any HI/AVH. Pt is hoping to receive an update from the [facility] program on 11/20/14. Pt states that her family is not an option for discharge as they are known to abuse drugs as well.  A: Writer administered scheduled and prn medications to pt, per MD orders. Continued support and availability as needed was extended to this pt. Staff continue to monitor pt with q6415min checks.  R: No adverse drug reactions noted. Pt receptive to treatment. Pt remains safe at this time.

## 2014-11-19 NOTE — Progress Notes (Signed)
[facility] MD Progress Note  11/19/2014 5:32 PM Alyssa Kelley  MRN:  [id number] Subjective:  Alyssa Kelley is having a hard time. She admits she is feeling increasingly more depressed overwhelmed. She states she is starting to feel hopeless again. Feels that nothing is going to work out for her. She has not heard from [facility] or the [facility] program. She states she does not want to relapse. Afraid of this cycle. She is afraid of qoing to her Alyssa Kelley' as she states her Alyssa Kelley is using heroin and she knows that although she is trying really hard, she will use again. States she is already craving it (becomes tearful when talking about this) Principal Problem: <principal problem not specified> Diagnosis:   Patient Active Problem List   Diagnosis Date Noted  . 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 Alcohol use disorder, severe, dependence [F10.20] 11/12/2014  . Heroin abuse [F11.10] 09/10/2013  . Alcohol abuse [F10.10] 09/10/2013  . Cocaine abuse [F14.10] 09/10/2013   Total Time spent with patient: 30 minutes   Past Medical History:  Past Medical History  Diagnosis Date  . Abscess of mouth   . Heroin abuse   . Anxiety   . Depression   . Hypertension    History reviewed. No pertinent past surgical history. Family History: History reviewed. No pertinent family history. Social History:  History  Alcohol Use  . Yes    Comment: one 12 pack qd     History  Drug Use  . Yes  . Special: Cocaine, Oxycodone, Heroin    History   Social History  . Marital Status: Single    Spouse Name: N/A  . Number of Children: N/A  . Years of Education: N/A   Social History Main Topics  . Smoking status: Current Every Day Smoker -- 1.00 packs/day for 15 years    Types: Cigarettes  . Smokeless tobacco: Not on file  . Alcohol Use: Yes     Comment: one 12 pack qd  . Drug Use: Yes    Special: Cocaine, Oxycodone, Heroin  . Sexual Activity: Yes    Birth Control/  Protection: Condom   Other Topics Concern  . None   Social History Narrative   Additional History:    Sleep: Poor  Appetite:  Poor   Assessment:   Musculoskeletal: Strength & Muscle Tone: within normal limits Gait & Station: normal Patient leans: N/A   Psychiatric Specialty Exam: Physical Exam  Review of Systems  Constitutional: Positive for malaise/fatigue.  HENT: Negative.   Eyes: Negative.   Respiratory: Negative.   Cardiovascular: Negative.   Gastrointestinal: Negative.   Genitourinary: Negative.   Musculoskeletal: Negative.   Skin: Negative.   Neurological: Negative.   Endo/Heme/Allergies: Negative.   Psychiatric/Behavioral: Positive for depression and substance abuse. The patient is nervous/anxious and has insomnia.     Blood pressure 110/63, pulse 88, temperature 98.5 F (36.9 C), temperature source Oral, resp. rate 24, height 5' (1.524 m), weight 93.441 kg (206 lb), last menstrual period 10/21/2014, SpO2 100 %.Body mass index is 40.23 kg/(m^2).  General Appearance: Fairly Groomed  Patent attorney::  Fair  Speech:  Clear and Coherent  Volume:  Decreased  Mood:  Anxious and Depressed  Affect:  anxious worried depressed tearful  Thought Process:  Coherent and Goal Directed  Orientation:  Full (Time, Place, and Person)  Thought Content:  symtpoms envents worries concerns  Suicidal Thoughts:  afraid to get into the hopelessness and be suicidal again  Homicidal Thoughts:  No  Memory:  Immediate;   Fair Recent;   Fair Remote;   Fair  Judgement:  Fair  Insight:  Present and Shallow  Psychomotor Activity:  Restlessness  Concentration:  Fair  Recall:  FiservFair  Fund of Knowledge:Fair  Language: Fair  Akathisia:  No  Handed:  Right  AIMS (if indicated):     Assets:  Desire for Improvement  ADL's:  Intact  Cognition: WNL  Sleep:  Number of Hours: 5     Current Medications: Current Facility-Administered Medications  Medication Dose Route Frequency Provider  Last Rate Last Dose  . alum & mag hydroxide-simeth (MAALOX/MYLANTA) 200-200-20 MG/5ML suspension 30 mL  30 mL Oral PRN Earney NavyJosephine C Onuoha, NP   30 mL at 11/16/14 1810  . chlordiazePOXIDE (LIBRIUM) capsule 25 mg  25 mg Oral QID PRN Rachael FeeIrving A Kamareon Sciandra, MD   25 mg at 11/19/14 1427  . gabapentin (NEURONTIN) capsule 300 mg  300 mg Oral TID Rachael FeeIrving A Alfons Sulkowski, MD   300 mg at 11/19/14 1606  . gabapentin (NEURONTIN) capsule 300 mg  300 mg Oral QHS Rachael FeeIrving A Irma Roulhac, MD   300 mg at 11/18/14 2205  . hydrOXYzine (ATARAX/VISTARIL) tablet 25 mg  25 mg Oral Q6H PRN Earney NavyJosephine C Onuoha, NP   25 mg at 11/18/14 1636  . ibuprofen (ADVIL,MOTRIN) tablet 600 mg  600 mg Oral Q8H PRN Adonis BrookSheila Agustin, NP   600 mg at 11/19/14 1607  . mirtazapine (REMERON) tablet 30 mg  30 mg Oral QHS Rachael FeeIrving A Rayana Geurin, MD   30 mg at 11/18/14 2205  . multivitamin with minerals tablet 1 tablet  1 tablet Oral Daily Rachael FeeIrving A Nao Linz, MD   1 tablet at 11/19/14 0831  . nicotine (NICODERM CQ - dosed in mg/24 hours) patch 21 mg  21 mg Transdermal Daily Rachael FeeIrving A Jaquelinne Glendening, MD   21 mg at 11/19/14 [phone]  . ondansetron (ZOFRAN) tablet 4 mg  4 mg Oral Q8H PRN Earney NavyJosephine C Onuoha, NP      . PARoxetine (PAXIL) tablet 40 mg  40 mg Oral Daily Rachael FeeIrving A Kamaron Deskins, MD   40 mg at 11/19/14 0830  . potassium chloride SA (K-DUR,KLOR-CON) CR tablet 20 mEq  20 mEq Oral Daily Earney NavyJosephine C Onuoha, NP   20 mEq at 11/19/14 0831  . prazosin (MINIPRESS) capsule 1 mg  1 mg Oral QHS Rachael FeeIrving A Ymani Porcher, MD   1 mg at 11/18/14 2204  . propranolol (INDERAL) tablet 10 mg  10 mg Oral TID Adonis BrookSheila Agustin, NP   10 mg at 11/19/14 1606  . thiamine (VITAMIN B-1) tablet 100 mg  100 mg Oral Daily Earney NavyJosephine C Onuoha, NP   100 mg at 11/19/14 0831  . zolpidem (AMBIEN) tablet 10 mg  10 mg Oral QHS Rachael FeeIrving A Edda Orea, MD   10 mg at 11/18/14 2206    Lab Results: No results found for this or any previous visit (from the past 48 hour(s)).  Physical Findings: AIMS: Facial and Oral Movements Muscles of Facial Expression: None,  normal Lips and Perioral Area: None, normal Jaw: None, normal Tongue: None, normal,Extremity Movements Upper (arms, wrists, hands, fingers): None, normal Lower (legs, knees, ankles, toes): None, normal, Trunk Movements Neck, shoulders, hips: None, normal, Overall Severity Severity of abnormal movements (highest score from questions above): None, normal Incapacitation due to abnormal movements: None, normal Patient's awareness of abnormal movements (rate only patient's report): No Awareness, Dental Status Current problems with teeth and/or dentures?: No Does patient usually wear dentures?:  No  CIWA:  CIWA-Ar Total: 0 COWS:  COWS Total Score: 3  Treatment Plan Summary: Daily contact with patient to assess and evaluate symptoms and progress in treatment and Medication management Supportive approach/coping skills Polysubstance dependence; continue to work a relapse prevention plan Depression; will continue the Paxil 40 mg with the Remeron 30 mg  Will continue to explore alternatives of placement as she does not have a safe place to be and she can easily relapse what will affect her elegibility to go to rehab as she would have to come to detox again She is able to verbalize that she is afraid to get in the hopeless stance again when she was getting to be hopeful that things were going to turn around so suicide would not be an option Medical Decision Making:  Review of Psycho-Social Stressors (1) and Review of Medication Regimen & Side Effects (2)     Stepanie Graver A 11/19/2014, 5:32 PM

## 2014-11-19 NOTE — [facility] Suicide Risk Assessment (Signed)
[facility] INPATIENT:  Family/Significant Other Suicide Prevention Education  Suicide Prevention Education:  Education Completed; Letta KocherRicky Hunt, Pt's boyfriend, has been identified by the patient as the family member/significant other with whom the patient will be residing, and identified as the person(s) who will aid the patient in the event of a mental health crisis (suicidal ideations/suicide attempt).  With written consent from the patient, the family member/significant other has been provided the following suicide prevention education, prior to the and/or following the discharge of the patient.  The suicide prevention education provided includes the following:  Suicide risk factors  Suicide prevention and interventions  National Suicide Hotline telephone number  [facility] assessment telephone number  [facility] Emergency Assistance 911  [facility] and/or Residential Mobile Crisis Unit telephone number  Request made of family/significant other to:  Remove weapons (e.g., guns, rifles, knives), all items previously/currently identified as safety concern.    Remove drugs/medications (over-the-counter, prescriptions, illicit drugs), all items previously/currently identified as a safety concern.  The family member/significant other verbalizes understanding of the suicide prevention education information provided.  The family member/significant other agrees to remove the items of safety concern listed above.  Elaina Hoopsarter, Helder Crisafulli M 11/19/2014, 12:58 PM

## 2014-11-19 NOTE — Progress Notes (Signed)
Adult Psychoeducational Group Note  Date:  11/19/2014 Time:  10:57 PM  Group Topic/Focus:  Wrap-Up Group:   The focus of this group is to help patients review their daily goal of treatment and discuss progress on daily workbooks.  Participation Level:  Active  Participation Quality:  Appropriate  Affect:  Appropriate  Cognitive:  Appropriate  Insight: Appropriate  Engagement in Group:  Engaged  Modes of Intervention:  Discussion  Additional Comments:  Pt denied having any thoughts of harm self or anyone else.  Consepcion Utt C 11/19/2014, 10:57 PM 

## 2014-11-20 NOTE — Plan of Care (Signed)
Problem: Alteration in mood & ability to function due to Goal: STG-Patient will comply with prescribed medication regimen (Patient will comply with prescribed medication regimen)  Outcome: Progressing Pt is compliant with taking her medications as administered and prescribed.

## 2014-11-20 NOTE — Progress Notes (Signed)
Patient ID: Alyssa Kelley, female   DOB: 11/16/1970, 44 y.o.   MRN: [id number]  Pt currently presents with a anxious affect and behavior. Per self inventory, pt rates depression, hopelessness and anxiety at a 10. Pt states "I am waiting to hear back from [facility] or [facility] about going somewhere before [facility]. [facility] until then." Pt reports poor sleep, poor concentration and a fair appetite.   Pt provided with medications per providers orders. Pt's labs and vitals were monitored throughout the day. Pt supported emotionally and encouraged to express concerns and questions. Pt educated on medications, substance abuse and suicide precautions.  Pt's safety ensured with 15 minute and environmental checks. Pt currently denies SI/HI and A/V hallucinations. Pt verbally agrees to seek staff if SI/HI or A/VH occurs and to consult with staff before acting on these thoughts. Pt reports "I wish you guys weren't just short term, I like it here. It was childish of me to not accept the other bed." Pt to be discharged today.

## 2014-11-20 NOTE — Tx Team (Signed)
Interdisciplinary Treatment Plan Update (Adult) 11/20/2014    Date: 11/20/2014 9:58 AM  Progress in Treatment:  Attending groups: Yes  Participating in groups: Yes  Taking medication as prescribed: Yes  Tolerating medication: Yes  Family/Significant othe contact made: Yes, CSW spoke with Pt's boyfriend Patient understands diagnosis: Yes AEB her ability to discuss her depression/substance abuse and willingness to seek treatment. Discussing patient identified problems/goals with staff: Yes  Medical problems stabilized or resolved: Yes  Denies suicidal/homicidal ideation: No, Pt endorses SI but will contract for safety.  Patient has not harmed self or Others: Yes   New problem(s) identified:   Discharge Plan or Barriers: 11/13/2014: Pt is requesting residential treatment for substance abuse. CSW to refer Pt to appropriate facilities. Pt is currently homeless and has been staying with different people in the area.   11/17/14: Pt continues to request residential treatment. [facility].   11/20/2014: Pt reported yesterday that she can no longer stay with her aunt at discharge and has nowhere to live at discharge while [facility]. She continues to endorse depressive symptoms and passive SI.  Additional comments:  Patient is a 44yo year old Caucasian female with diagnoses of Alcohol Use Disorder, severe, Heroine abuse, alcohol abuse, cocaine abuse, and Major Depressive Disorder, recurrent, severe. Pt reports getting into a fight with a friend prior to admission due to substance-related issues. She reports that her feelings of hopelessness are overwhelming and that this is the first time she has experienced SI. Pt has a long history of substance abuse beginning at age 44. At that time, her father had been murdered and her mother died of cirrhosis of the liver. Pt reports that both her parents abused substances as well. Pt identifies her drug  of choice as heroine; however at this time she is using multiple substances such as heroine, methadone, benzodiazepines, opiates, and alcohol. Pt was released from jail one month ago and relapsed upon release. Since that time she has been staying with friends and using. She is motivated for treatment and is requesting residential treatment.    Reason for Continuation of Hospitalization:  Anxiety Depression Suicidal ideation Withdrawal symptoms  Estimated length of stay: 3-5 days  For review of initial/current patient goals, please see plan of care.    Attendees:  Patient:    Family:    Physician: Dr. Dub MikesLugo MD  11/20/2014 8:25 AM  Nursing: Onnie BoerJennifer Clark, RN Case manager  11/20/2014 8:25 AM  Clinical Social Worker Lamar SprinklesLauren Carter, LCSWA, MSW 11/20/2014 8:25 AM  Other: Leisa LenzValerie Enoch, Vesta MixerMonarch Liasion 11/20/2014 8:25 AM  Clinical:  Perlie GoldKaren S., Sara T., RN 11/20/2014 8:25 AM  Other: , RN Charge Nurse 11/20/2014 8:25 AM  Other: Melina ModenaKristin Drinkark, LCSW; Yancey FlemingsQuylle Hodnette, LCSW                                  Chad CordialLauren Carter, LCSWA MSW 11/20/2014 8:46 AM

## 2014-11-20 NOTE — [facility] Group Notes (Signed)
[facility] Note  11/20/2014 8:45 AM  Participation Quality: Alert, Appropriate and Oriented  Mood/Affect: Flat/Depressed   Depression Rating: 10  Anxiety Rating: 10  Thoughts of Suicide: Pt endorses passive SI  Will you contract for safety? Yes  Current AVH: Pt denies  Plan for Discharge/Comments: Pt attended discharge planning group and actively participated in group. CSW provided pt with today's workbook. Pt reports feeling scared about discharge. Pt has been actively calling treatment programs to facilitate treatment. She [facility] until her [facility] screening on the 7th of June.   Transportation Means: Pt reports access to transportation  Supports: No supports mentioned at this time  Chad CordialLauren Carter, LCSWA 11/20/2014 10:26 AM

## 2014-11-20 NOTE — Progress Notes (Signed)
Patient went to another nurse on another hall and stated she was suicidal.  Nurse told patient's nurse.  Patient asked if she did leave, and [facility], [facility].  Nurse explained to MD what patient had said.  MD discussed with NP's and staff.  Patient was told that she could stay another night.  MD was firm, just one more day.  Patient smiled, wiped her eyes, and said "thank you" several times to the nurse who told her that she would not be discharged today.  Patient stated that she had an appointment on Tuesday morning.  Safety maintained with 15 minute checks.

## 2014-11-20 NOTE — Progress Notes (Signed)
  [facility] Adult Case Management Discharge Plan :  Will you be returning to the same living situation after discharge:  No. [facility] until she secures a treatment bed. At discharge, do you have transportation home?: Yes,  Pt provided with bus pass Do you have the ability to pay for your medications: Yes,  Pt provided with samples and prescriptions  Release of information consent forms completed and in the chart;  Patient's signature needed at discharge.  Patient to Follow up at: Follow-up Information    [facility]/2014.   Why:  at 8am for your intitial screening. At this time you will be placed on the waitlist. Please bring photo ID along with social security card.   Contact information:   [address] [place], [place] [address] [phone]      Follow up with [facility].   Why:  Please follow-up with Melissa to confirm that you are still interested in treatment. You are currently on the waitlist.    Contact information:   [address] [place], [place] [address] [phone]      Patient denies SI/HI: No.  She reports passive SI; however MD feels it is safe for Pt to discharge.  Safety Planning and Suicide Prevention discussed: Yes,  with boyfriend; see SPE note for further details  Have you used any form of tobacco in the last 30 days? (Cigarettes, Smokeless Tobacco, Cigars, and/or Pipes): Yes  Has patient been referred to the Quitline?: Patient refused referral  Elaina HoopsCarter, Rodneisha Bonnet M 11/20/2014, 11:18 AM

## 2014-11-20 NOTE — Plan of Care (Signed)
Problem: Diagnosis: Increased Risk For Suicide Attempt Goal: STG-Patient Will Attend All Groups On The Unit Outcome: Progressing Pt attended two consecutive evening groups during writer's care of pt.

## 2014-11-20 NOTE — Progress Notes (Signed)
Recreation Therapy Notes  Date: 06.03.16 Time: 9:30am Location: 300 Group Room  Group Topic: Stress Management  Goal Area(s) Addresses:  Patient will verbalize importance of using healthy stress management.  Patient will identify positive emotions associated with healthy stress management.   Behavioral Response: Attentive  Intervention: Stress Management  Activity :  Progressive Muscle Relaxation.  LRT introduced patients to stress management technique of progressive muscle relaxation.  Kelley script was used to deliver the technique and patients were asked to follow script read by LRT to engage in practicing the stress management technique.    Education:  Stress Management, Discharge Planning.   Education Outcome: Acknowledges edcuation/In group clarification offered  Clinical Observations/Feedback: Patient attended group.   Alyssa Kelley, LRT/CTRS         Alyssa Kelley 11/20/2014 1:38 PM 

## 2014-11-20 NOTE — Progress Notes (Signed)
Patient ID: Alyssa BeckmannLeigh N Kelley, female   DOB: Aug 01, 1970, 44 y.o.   MRN: [id number]  D: Patient noted to be laughing loudly and acting silly on unit with peers in the milieu. Pt having to be asked to hold down her laughter as it is loud on the unit and can be heard in the hallways. Pt with bright affect and coloring and watching tv. Patient states "I sure hope they let me stay here until my follow up appointment, that's all I need is somewhere to stay til they see me. I don't have anywhere else to go." Pt smiling during conversation and giggling to this RN.  A: Q 15 minute safety checks, encourage staff/peer interaction and group participation. Administer medications as ordered by MD.  R: Patient's mood incongruent with reported depressed state. Pt seemingly trying to avoid discharge and malinger.

## 2014-11-20 NOTE — Progress Notes (Signed)
[facility] MD Progress Note  11/20/2014 4:52 PM Alyssa Kelley  MRN:  [id number] Subjective:  Alyssa Kelley endorsed suicidal ideas. She was getting ready to be D/C when she told the nurse that she was suicidal. She has been waiting for a bed at [facility] or [facility] and has a screening for admission to [facility] Tuesday AM. She has not heard from the first two. She continues to endorse that she has no one that she is all alone and the people she could go to are actively using heroin. States that if she was to go there she will not be able to resist it and will use and then will probably try to OD as she does not want to keep going trough this anymore. She was being D/C with 2 weeks samples of all her medications as required to be admitted to [facility] or [facility]. She would have had to stay at he lobby of the shelter.  Principal Problem: <principal problem not specified> Diagnosis:   Patient Active Problem List   Diagnosis Date Noted  . 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 Alcohol use disorder, severe, dependence [F10.20] 11/12/2014  . Heroin abuse [F11.10] 09/10/2013  . Alcohol abuse [F10.10] 09/10/2013  . Cocaine abuse [F14.10] 09/10/2013   Total Time spent with patient: 30 minutes   Past Medical History:  Past Medical History  Diagnosis Date  . Abscess of mouth   . Heroin abuse   . Anxiety   . Depression   . Hypertension    History reviewed. No pertinent past surgical history. Family History: History reviewed. No pertinent family history. Social History:  History  Alcohol Use  . Yes    Comment: one 12 pack qd     History  Drug Use  . Yes  . Special: Cocaine, Oxycodone, Heroin    History   Social History  . Marital Status: Single    Spouse Name: N/A  . Number of Children: N/A  . Years of Education: N/A   Social History Main Topics  . Smoking status: Current Every Day Smoker -- 1.00 packs/day for 15 years    Types: Cigarettes  . Smokeless  tobacco: Not on file  . Alcohol Use: Yes     Comment: one 12 pack qd  . Drug Use: Yes    Special: Cocaine, Oxycodone, Heroin  . Sexual Activity: Yes    Birth Control/ Protection: Condom   Other Topics Concern  . None   Social History Narrative   Additional History:    Sleep: Fair  Appetite:  Fair   Assessment:   Musculoskeletal: Strength & Muscle Tone: within normal limits Gait & Station: normal Patient leans: N/A   Psychiatric Specialty Exam: Physical Exam  Review of Systems  Constitutional: Negative.   HENT: Negative.   Eyes: Negative.   Respiratory: Negative.   Cardiovascular: Negative.   Gastrointestinal: Negative.   Genitourinary: Negative.   Musculoskeletal: Negative.   Skin: Negative.   Neurological: Negative.   Endo/Heme/Allergies: Negative.   Psychiatric/Behavioral: Positive for depression, suicidal ideas and substance abuse. The patient is nervous/anxious.     Blood pressure 109/66, pulse 77, temperature 98.1 F (36.7 C), temperature source Oral, resp. rate 18, height 5' (1.524 m), weight 93.441 kg (206 lb), last menstrual period 10/21/2014, SpO2 100 %.Body mass index is 40.23 kg/(m^2).  General Appearance: Fairly Groomed  Patent attorney::  Fair  Speech:  Clear and Coherent  Volume:  fluctuates  Mood:  Anxious, Depressed, Dysphoric and  Hopeless  Affect:  Depressed and Tearful  Thought Process:  Coherent and Goal Directed  Orientation:  Full (Time, Place, and Person)  Thought Content:  a sense of hopelessness helplessness  Suicidal Thoughts:  Yes   Homicidal Thoughts:  No  Memory:  Immediate;   Fair Recent;   Fair Remote;   Fair  Judgement:  Fair  Insight:  Present  Psychomotor Activity:  Decreased  Concentration:  Fair  Recall:  FiservFair  Fund of Knowledge:Fair  Language: Fair  Akathisia:  No  Handed:  Right  AIMS (if indicated):     Assets:  Desire for Improvement  ADL's:  Intact  Cognition: WNL  Sleep:  Number of Hours: 5.5     Current  Medications: Current Facility-Administered Medications  Medication Dose Route Frequency Provider Last Rate Last Dose  . alum & mag hydroxide-simeth (MAALOX/MYLANTA) 200-200-20 MG/5ML suspension 30 mL  30 mL Oral PRN Earney NavyJosephine C Onuoha, NP   30 mL at 11/19/14 1752  . chlordiazePOXIDE (LIBRIUM) capsule 25 mg  25 mg Oral QID PRN Rachael FeeIrving A Carleen Rhue, MD   25 mg at 11/20/14 1448  . gabapentin (NEURONTIN) capsule 300 mg  300 mg Oral TID Rachael FeeIrving A Aron Needles, MD   300 mg at 11/20/14 1153  . gabapentin (NEURONTIN) capsule 300 mg  300 mg Oral QHS Rachael FeeIrving A Ellison Rieth, MD   300 mg at 11/19/14 2213  . hydrOXYzine (ATARAX/VISTARIL) tablet 25 mg  25 mg Oral Q6H PRN Earney NavyJosephine C Onuoha, NP   25 mg at 11/19/14 1751  . ibuprofen (ADVIL,MOTRIN) tablet 600 mg  600 mg Oral Q8H PRN Adonis BrookSheila Agustin, NP   600 mg at 11/20/14 [id number]  . mirtazapine (REMERON) tablet 30 mg  30 mg Oral QHS Rachael FeeIrving A Aki Abalos, MD   30 mg at 11/19/14 2213  . multivitamin with minerals tablet 1 tablet  1 tablet Oral Daily Rachael FeeIrving A Mattthew Ziomek, MD   1 tablet at 11/20/14 [phone]  . nicotine (NICODERM CQ - dosed in mg/24 hours) patch 21 mg  21 mg Transdermal Daily Rachael FeeIrving A Wendee Hata, MD   21 mg at 11/20/14 0819  . ondansetron (ZOFRAN) tablet 4 mg  4 mg Oral Q8H PRN Earney NavyJosephine C Onuoha, NP      . PARoxetine (PAXIL) tablet 40 mg  40 mg Oral Daily Rachael FeeIrving A Rykar Lebleu, MD   40 mg at 11/20/14 0819  . potassium chloride SA (K-DUR,KLOR-CON) CR tablet 20 mEq  20 mEq Oral Daily Earney NavyJosephine C Onuoha, NP   20 mEq at 11/20/14 0819  . prazosin (MINIPRESS) capsule 1 mg  1 mg Oral QHS Rachael FeeIrving A Windsor Zirkelbach, MD   1 mg at 11/19/14 2213  . propranolol (INDERAL) tablet 10 mg  10 mg Oral TID Adonis BrookSheila Agustin, NP   10 mg at 11/20/14 1153  . thiamine (VITAMIN B-1) tablet 100 mg  100 mg Oral Daily Earney NavyJosephine C Onuoha, NP   100 mg at 11/20/14 0819  . zolpidem (AMBIEN) tablet 10 mg  10 mg Oral QHS Rachael FeeIrving A Shenay Torti, MD   10 mg at 11/19/14 2213    Lab Results: No results found for this or any previous visit (from the past 48  hour(s)).  Physical Findings: AIMS: Facial and Oral Movements Muscles of Facial Expression: None, normal Lips and Perioral Area: None, normal Jaw: None, normal Tongue: None, normal,Extremity Movements Upper (arms, wrists, hands, fingers): None, normal Lower (legs, knees, ankles, toes): None, normal, Trunk Movements Neck, shoulders, hips: None, normal, Overall Severity Severity of abnormal movements (highest score  from questions above): None, normal Incapacitation due to abnormal movements: None, normal Patient's awareness of abnormal movements (rate only patient's report): No Awareness, Dental Status Current problems with teeth and/or dentures?: No Does patient usually wear dentures?: No  CIWA:  CIWA-Ar Total: 0 COWS:  COWS Total Score: 3  Treatment Plan Summary: Daily contact with patient to assess and evaluate symptoms and progress in treatment and Medication management Supportive approach/coping skills Polysubstance dependence; continue to work a relapse prevention plan Depression; continue the Paxil with the Remeron Nightmares; continue the Minipress  Mood instability; continue the Neurontin Note; the medications seemed to be working. She is reacting to situational stress of not having a clear idea of where she is going t go for rehab  and when. She was going to be D/C today with 2 weeks samples of all her medications as requested by [facility] and [facility] She would have had a lethal supply of medications on her hands She is quite impulsive and if she was to take some of those samples even if just acting out to get back to the Emergency Room and back to [facility] she could do some harm to herself.  Will go ahead and cancel the D/C today and reassess in the morning although going into the weekend we will probably not hear from [facility] or [facility] until monday  Medical Decision Making:  Review of Psycho-Social Stressors (1) and Review of Medication Regimen & Side Effects  (2)     Angeline Trick A 11/20/2014, 4:52 PM

## 2014-11-21 DIAGNOSIS — F515 Nightmare disorder: Secondary | ICD-10-CM

## 2014-11-21 DIAGNOSIS — F39 Unspecified mood [affective] disorder: Secondary | ICD-10-CM

## 2014-11-21 DIAGNOSIS — F1920 Other psychoactive substance dependence, uncomplicated: Secondary | ICD-10-CM

## 2014-11-21 DIAGNOSIS — F329 Major depressive disorder, single episode, unspecified: Secondary | ICD-10-CM

## 2014-11-21 DIAGNOSIS — R45851 Suicidal ideations: Secondary | ICD-10-CM

## 2014-11-21 NOTE — Progress Notes (Signed)
Pt attended group this evening. 

## 2014-11-21 NOTE — Progress Notes (Signed)
Patient ID: Alyssa Kelley, female   DOB: 1970/09/25, 44 y.o.   MRN: [id number] [facility]  11/21/2014 5:37 PM Alyssa Kelley  MRN:  [id number]  Subjective: Alyssa Kelley says, "I'm feeling suicidal. I don't feel safe going today. I'm afraid if I get discharged today, I will do something bad to myself or I will just go to the ED right from here to stop me doing something bad to myself"  Alyssa Kelley continue endorsed suicidal ideas. She is visible on the unit, attending & participating in the group milieu. She presents with good affects. She is nonchalant about feeling suicidal. She appears stable & tolerating her medications well. She appears to be in no apparent distress.  Principal Problem: <principal problem not specified> Diagnosis:   Patient Active Problem List   Diagnosis Date Noted  . 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 Alcohol use disorder, severe, dependence [F10.20] 11/12/2014  . Heroin abuse [F11.10] 09/10/2013  . Alcohol abuse [F10.10] 09/10/2013  . Cocaine abuse [F14.10] 09/10/2013   Total Time spent with patient: 30 minutes   Past Medical History:  Past Medical History  Diagnosis Date  . Abscess of mouth   . Heroin abuse   . Anxiety   . Depression   . Hypertension    History reviewed. No pertinent past surgical history. Family History: History reviewed. No pertinent family history. Social History:  History  Alcohol Use  . Yes    Comment: one 12 pack qd     History  Drug Use  . Yes  . Special: Cocaine, Oxycodone, Heroin    History   Social History  . Marital Status: Single    Spouse Name: N/A  . Number of Children: N/A  . Years of Education: N/A   Social History Main Topics  . Smoking status: Current Every Day Smoker -- 1.00 packs/day for 15 years    Types: Cigarettes  . Smokeless tobacco: Not on file  . Alcohol Use: Yes     Comment: one 12 pack qd  . Drug Use: Yes    Special: Cocaine, Oxycodone,  Heroin  . Sexual Activity: Yes    Birth Control/ Protection: Condom   Other Topics Concern  . None   Social History Narrative   Additional History:    Sleep: Fair  Appetite:  Fair   Assessment:   Musculoskeletal: Strength & Muscle Tone: within normal limits Gait & Station: normal Patient leans: N/A  Psychiatric Specialty Exam: Physical Exam  ROS  Blood pressure 117/74, pulse 82, temperature 98.4 F (36.9 C), temperature source Oral, resp. rate 18, height 5' (1.524 m), weight 93.441 kg (206 lb), last menstrual period 10/21/2014, SpO2 100 %.Body mass index is 40.23 kg/(m^2).  General Appearance: Fairly Groomed  Patent attorneyye Contact::  Fair  Speech:  Clear and Coherent  Volume:  fluctuates  Mood:  Euthymic  Affect:  Appropriate  Thought Process:  Coherent  Orientation:  Full (Time, Place, and Person)  Thought Content:  a sense of hopelessness helplessness  Suicidal Thoughts:  Yes ,"I don't feel safe for discharge"  Homicidal Thoughts:  No  Memory:  Immediate;   Fair Recent;   Fair Remote;   Fair  Judgement:  Fair  Insight:  Present  Psychomotor Activity:  Decreased  Concentration:  Fair  Recall:  FiservFair  Fund of Knowledge:Fair  Language: Fair  Akathisia:  No  Handed:  Right  AIMS (if indicated):     Assets:  Desire for  Improvement  ADL's:  Intact  Cognition: WNL  Sleep:  Number of Hours: 5.5   Current Medications: Current Facility-Administered Medications  Medication Dose Route Frequency Provider Last Rate Last Dose  . alum & mag hydroxide-simeth (MAALOX/MYLANTA) 200-200-20 MG/5ML suspension 30 mL  30 mL Oral PRN Earney Navy, NP   30 mL at 11/19/14 1752  . chlordiazePOXIDE (LIBRIUM) capsule 25 mg  25 mg Oral QID PRN Rachael Fee, MD   25 mg at 11/21/14 1533  . gabapentin (NEURONTIN) capsule 300 mg  300 mg Oral TID Rachael Fee, MD   300 mg at 11/21/14 1700  . gabapentin (NEURONTIN) capsule 300 mg  300 mg Oral QHS Rachael Fee, MD   300 mg at 11/20/14 2114   . hydrOXYzine (ATARAX/VISTARIL) tablet 25 mg  25 mg Oral Q6H PRN Earney Navy, NP   25 mg at 11/19/14 1751  . ibuprofen (ADVIL,MOTRIN) tablet 600 mg  600 mg Oral Q8H PRN Adonis Brook, NP   600 mg at 11/21/14 1700  . mirtazapine (REMERON) tablet 30 mg  30 mg Oral QHS Rachael Fee, MD   30 mg at 11/20/14 2114  . multivitamin with minerals tablet 1 tablet  1 tablet Oral Daily Rachael Fee, MD   1 tablet at 11/21/14 0818  . nicotine (NICODERM CQ - dosed in mg/24 hours) patch 21 mg  21 mg Transdermal Daily Rachael Fee, MD   21 mg at 11/21/14 0820  . ondansetron (ZOFRAN) tablet 4 mg  4 mg Oral Q8H PRN Earney Navy, NP      . PARoxetine (PAXIL) tablet 40 mg  40 mg Oral Daily Rachael Fee, MD   40 mg at 11/21/14 0818  . potassium chloride SA (K-DUR,KLOR-CON) CR tablet 20 mEq  20 mEq Oral Daily Earney Navy, NP   20 mEq at 11/21/14 0818  . prazosin (MINIPRESS) capsule 1 mg  1 mg Oral QHS Rachael Fee, MD   1 mg at 11/20/14 2114  . propranolol (INDERAL) tablet 10 mg  10 mg Oral TID Adonis Brook, NP   10 mg at 11/21/14 1700  . thiamine (VITAMIN B-1) tablet 100 mg  100 mg Oral Daily Earney Navy, NP   100 mg at 11/21/14 0820  . zolpidem (AMBIEN) tablet 10 mg  10 mg Oral QHS Rachael Fee, MD   10 mg at 11/20/14 2114    Lab Results: No results found for this or any previous visit (from the past 48 hour(s)).  Physical Findings: AIMS: Facial and Oral Movements Muscles of Facial Expression: None, normal Lips and Perioral Area: None, normal Jaw: None, normal Tongue: None, normal,Extremity Movements Upper (arms, wrists, hands, fingers): None, normal Lower (legs, knees, ankles, toes): None, normal, Trunk Movements Neck, shoulders, hips: None, normal, Overall Severity Severity of abnormal movements (highest score from questions above): None, normal Incapacitation due to abnormal movements: None, normal Patient's awareness of abnormal movements (rate only patient's report):  No Awareness, Dental Status Current problems with teeth and/or dentures?: No Does patient usually wear dentures?: No  CIWA:  CIWA-Ar Total: 0 COWS:  COWS Total Score: 3  Treatment Plan Summary: Daily contact with patient to assess and evaluate symptoms and progress in treatment and Medication management Supportive approach/coping skills Polysubstance dependence; continue to work a relapse prevention plan Depression; continue Paxil with the Remeron Nightmares; continue Minipress  Mood instability; continue Neurontin Note; the medications seemed to be working. She is reacting to situational  stress of not having a clear idea of where she is going t go for rehab  and when. She was going to be D/C today with 2 weeks samples of all her medications as requested by [facility] and [facility] She would have had a lethal supply of medications on her hands She is quite impulsive and if she was to take some of those samples even if just acting out to get back to the Emergency Room and back to [facility] she could do some harm to herself.  Will go ahead and cancel the D/C today and reassess in the morning although going into the weekend we will probably not hear from [facility] or [facility] until monday  Medical Decision Making:  Review of Psycho-Social Stressors (1) and Review of Medication Regimen & Side Effects (2)  Sanjuana Kava, PMHNP, FNP-BC 11/21/2014, 5:37 PM  Reviewed the information documented and agree with the treatment plan.  Terrah Decoster,JANARDHAHA R. 11/22/2014 4:50 PM

## 2014-11-21 NOTE — Discharge Summary (Signed)
Physician Discharge Summary Note  Patient:  Alyssa Kelley is an 44 y.o., female MRN:  [id number] DOB:  Mar 03, 1971 Patient phone:  [phone] (home)  Patient address:   [address] [place] [address],   Total Time spent with patient: Greater than 30 minutes  Date of Admission:  11/12/2014  Date of Discharge: 11-21-14  Reason for Admission:  Drug detoxification treatment.  Principal Problem: <principal problem not specified> Discharge Diagnoses: Patient Active Problem List   Diagnosis Date Noted  . 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 Alcohol use disorder, severe, dependence [F10.20] 11/12/2014  . Heroin abuse [F11.10] 09/10/2013  . Alcohol abuse [F10.10] 09/10/2013  . Cocaine abuse [F14.10] 09/10/2013   Musculoskeletal: Strength & Muscle Tone: within normal limits Gait & Station: normal Patient leans: N/A  Psychiatric Specialty Exam: Physical Exam  ROS  Blood pressure 105/70, pulse 95, temperature 98.4 F (36.9 C), temperature source Oral, resp. rate 18, height 5' (1.524 m), weight 93.441 kg (206 lb), last menstrual period 10/21/2014, SpO2 100 %.Body mass index is 40.23 kg/(m^2).  See Md's SRA assessment.   Have you used any form of tobacco in the last 30 days? (Cigarettes, Smokeless Tobacco, Cigars, and/or Pipes): Yes  Has this patient used any form of tobacco in the last 30 days? (Cigarettes, Smokeless Tobacco, Cigars, and/or Pipes) Yes, A prescription for an FDA-approved tobacco cessation medication was offered at discharge and the patient refused  Past Medical History:  Past Medical History  Diagnosis Date  . Abscess of mouth   . Heroin abuse   . Anxiety   . Depression   . Hypertension    History reviewed. No pertinent past surgical history. Family History: History reviewed. No pertinent family history. Social History:  History  Alcohol Use  . Yes    Comment: one 12 pack qd     History   Drug Use  . Yes  . Special: Cocaine, Oxycodone, Heroin    History   Social History  . Marital Status: Single    Spouse Name: N/A  . Number of Children: N/A  . Years of Education: N/A   Social History Main Topics  . Smoking status: Current Every Day Smoker -- 1.00 packs/day for 15 years    Types: Cigarettes  . Smokeless tobacco: Not on file  . Alcohol Use: Yes     Comment: one 12 pack qd  . Drug Use: Yes    Special: Cocaine, Oxycodone, Heroin  . Sexual Activity: Yes    Birth Control/ Protection: Condom   Other Topics Concern  . None   Social History Narrative   Risk to Self: Is patient at risk for suicide?: No What has been your use of drugs/alcohol within the last 12 months?: Pst month: heroine, methadone, benzodiazapines, cocaine, alcohol- using daily. Long history of substance abuse since the age of 89- drug of choice is heroine  Risk to Others: No  Prior Inpatient Therapy: Yes Prior Outpatient Therapy: Yes Level of Care:  OP  Hospital Course: 44 Y/o female who states she had been diagnosed with PTSD from losing both parents father was murdered mom died of cirrhosis. She was 15. States she started doing drugs and acohol at [age]. Got into heroin and cocaine angything she could get a hold of so she did not feel. In the last two months more anxious depressed thinking that life was not worth living having night terrors. States she is withdrawing from alcohol and benzos. She had  recently used cocaine and methadone. States she was in prison 4 months and had to detox from methadone 120 mg. She relapsed shortly after she got out, 2 months ago April 12.   During her hospital stay, Trinaty was assessed/evaluated and recommended to receive Librium detoxification treatment protocol to re-stabilize her systems of drug intoxication and to combat the withdrawal symptoms as well. This decision was based on her UDS test results that was (+) for Benzodiazepines & cocaine. She was also enrolled  in the group counseling sessions/activities where she was counseled, taught and learned coping skills that should help her after discharge to cope better and manage her problems to maintain a much longer sobriety. She was also enrolled/attended AA/NA meetings being offered and held on this unit.  Besides the detoxification treatments, Deriana was also medicated & discharged on(Gabapentin) Neurontin 300 mg tid for agitation/substance withdrawal syndrome, Mirtazapine 30 mg Q bedtime for depression, Hydroxyzine 25 mg for anxiety prn, Paroxetine 40 mg for depression, Prazosin 1 mg for nightmares, Propranolol 10 mg for anxiety & Thiamine 100 mg for thiamine deficiency. She presented other previously existing medical concerns that required medication management/monitoring, she received medication management for all those health issues. She was monitored closely for any potential problems that may arise as a result of her treatments. Shelbey tolerated her treatment regimen without any significant adverse effects and or reactions reported  Daryn has completed detox treatments & her mood is stable for over 3 days now. This is evidenced by her presentation of good affect, reduction of objective symptoms, participation in the group milieu, interaction with staff & other patients & absence of substance withdrawal symptoms. However, Merry did not feel like leaving the hospital yet. She felt she needed to endorse suicidal ideations to buy her self sometime to extend her hospital stay. Her discharge paper work has to be completed at 4 different times because each time that she was scheduled to discharge, Ewa will often become suicidal as a result of her fear to leave the hospital controlled environment. Today, Kerria has decided that she is ready to be discharged to continue further substance abuse treatment as scheduled below. Upon discharge, she adamantly denies any suicidal, homicidal ideations, auditory, visual  hallucinations, delusional thoughts, paranoia and or substance withdrawal symptoms. She was provided with a 2 weeks worth supply samples of her [facility] discharge medications. Jinnifer left [facility] with all personal belongings in no apparent distress. Transportation per her arrangement.  Consults:  psychiatry  Significant Diagnostic Studies:  labs: CBC with diff, CMP, UDS, toxicology tests, U/A, results reviewed, stable  Discharge Vitals:   Blood pressure 105/70, pulse 95, temperature 98.4 F (36.9 C), temperature source Oral, resp. rate 18, height 5' (1.524 m), weight 93.441 kg (206 lb), last menstrual period 10/21/2014, SpO2 100 %. Body mass index is 40.23 kg/(m^2). Lab Results:   No results found for this or any previous visit (from the past 72 hour(s)).  Physical Findings: AIMS: Facial and Oral Movements Muscles of Facial Expression: None, normal Lips and Perioral Area: None, normal Jaw: None, normal Tongue: None, normal,Extremity Movements Upper (arms, wrists, hands, fingers): None, normal Lower (legs, knees, ankles, toes): None, normal, Trunk Movements Neck, shoulders, hips: None, normal, Overall Severity Severity of abnormal movements (highest score from questions above): None, normal Incapacitation due to abnormal movements: None, normal Patient's awareness of abnormal movements (rate only patient's report): No Awareness, Dental Status Current problems with teeth and/or dentures?: No Does patient usually wear dentures?: No  CIWA:  CIWA-Ar  Total: 0 COWS:  COWS Total Score: 3   See Psychiatric Specialty Exam and Suicide Risk Assessment completed by Attending Physician prior to discharge.  Discharge destination:  Home  Is patient on multiple antipsychotic therapies at discharge:  No   Has Patient had three or more failed trials of antipsychotic monotherapy by history:  No  Recommended Plan for Multiple Antipsychotic Therapies: NA     Medication List    STOP taking these  medications        cloNIDine 0.1 MG tablet  Commonly known as:  CATAPRES     traZODone 100 MG tablet  Commonly known as:  DESYREL      TAKE these medications      Indication   gabapentin 300 MG capsule  Commonly known as:  NEURONTIN  Take one capsule (300 mg) three times during the day and one at bedtime.   Indication:  Neuropathic Pain     hydrOXYzine 25 MG tablet  Commonly known as:  ATARAX/VISTARIL  Take 1 tablet (25 mg total) by mouth every 6 (six) hours as needed for anxiety (anxiety).   Indication:  Anxiety Neurosis     mirtazapine 30 MG tablet  Commonly known as:  REMERON  Take 1 tablet (30 mg total) by mouth at bedtime.   Indication:  Trouble Sleeping     nicotine 21 mg/24hr patch  Commonly known as:  NICODERM CQ - dosed in mg/24 hours  Place 1 patch (21 mg total) onto the skin daily.   Indication:  Nicotine Addiction     PARoxetine 40 MG tablet  Commonly known as:  PAXIL  Take 1 tablet (40 mg total) by mouth daily.   Indication:  Major Depressive Disorder     potassium chloride SA 20 MEQ tablet  Commonly known as:  K-DUR,KLOR-CON  Take 1 tablet (20 mEq total) by mouth daily.   Indication:  Low Amount of Potassium in the Blood     prazosin 1 MG capsule  Commonly known as:  MINIPRESS  Take 1 capsule (1 mg total) by mouth at bedtime.   Indication:  nightmares     propranolol 10 MG tablet  Commonly known as:  INDERAL  Take 1 tablet (10 mg total) by mouth 3 (three) times daily.   Indication:  anxiety     thiamine 100 MG tablet  Take 1 tablet (100 mg total) by mouth daily.            Follow-up Information    Follow up with [facility] On 11/24/2014.   Why:  at 8am for your intitial screening. At this time you will be placed on the waitlist. Please bring photo ID along with social security card.   Contact information:   [address] [place], [place] [address] [phone]      Follow up with [facility].   Why:  Please follow-up with Melissa to  confirm that you are still interested in treatment. You are currently on the waitlist.    Contact information:   [address] [place], [place] [address] [phone]     Follow-up recommendations:  Activity:  As tolerated Diet: As recommended by your primary care doctor. Keep all scheduled follow-up appointments as recommended.  Comments:  Take all your medications as prescribed by your mental healthcare provider. Report any adverse effects and or reactions from your medicines to your outpatient provider promptly. Patient is instructed and cautioned to not engage in alcohol and or illegal drug use while on  prescription medicines. In the event of worsening symptoms, patient is instructed to call the crisis hotline, 911 and or go to the nearest ED for appropriate evaluation and treatment of symptoms. Follow-up with your primary care provider for your other medical issues, concerns and or health care needs.   Total Discharge Time: Greater than 30 minutes  Signed: Sanjuana Kava, PMHNP, FNP-BC 11/21/2014, 9:13 AM   Patient seen  Face to face for this evaluation and completed discharge suicide risk assessment and discussed with physician extender and made appropriate disposition plan. Reviewed the information documented and agree with the treatment plan.   Tamsen Reist,JANARDHAHA R. 11/24/2014 6:29 PM

## 2014-11-21 NOTE — [facility] Group Notes (Signed)
[facility] LCSW Group Therapy 11/20/2014 1:15pm  Type of Therapy: Group Therapy- Feelings Around Relapse and Recovery  Participation Level: Active   Participation Quality:  Appropriate  Affect:  Appropriate   Cognitive: Alert and Oriented   Insight:  Developing/Improving   Engagement in Therapy: Developing/Improving and Engaged   Modes of Intervention: Clarification, Confrontation, Discussion, Education, Exploration, Limit-setting, Orientation, Problem-solving, Rapport Building, Dance movement psychotherapisteality Testing, Socialization and Support  Summary of Progress/Problems: The topic for today was feelings about relapse. Pt discussed what relapse prevention is to them and identified triggers that they are on the path to relapse. Pt processed their feeling towards relapse and was able to relate to peers. Pt discussed coping skills that can be used for relapse prevention. She was able to identify current triggers to relapse such as being around others who use and feelings of loneliness. Pt reports that she feels residential treatment the only avenue that will prove successful in her recovery journey right now. Pt engaged appropriately with peers and was receptive to advice and suggestions.    Therapeutic Modalities:   Cognitive Behavioral Therapy Solution-Focused Therapy Assertiveness Training Relapse Prevention Therapy    Alyssa CordialLauren Kelley, Alyssa MajorsLCSWA [phone]

## 2014-11-21 NOTE — [facility] Group Notes (Signed)
[facility] Group Notes:  (Nursing/MHT/Case Management/Adjunct)  Date:  11/21/2014  Time:  0900  Type of Therapy:  Nurse Education  Participation Level:  Active  Participation Quality:  Appropriate  Affect:  Appropriate and Excited  Cognitive:  Alert  Insight:  Limited  Engagement in Group:  Engaged  Modes of Intervention:  Discussion  Summary of Progress/Problems:  Bennye Almrdley, Cassidy Tashiro Violon 11/21/2014, 1:31 PM

## 2014-11-21 NOTE — Progress Notes (Signed)
D: Pt denies HI/AV. Pt is pleasant and cooperative. Pt rates depression at a 10, anxiety at a 10, and Helplessness/hopelessness at a 10. Pt has passive SI but contracts for safety. Pt seems to be having situational anxiety and depression. Pt is also seems a little gamey as she is bright and happy having fun on the unit but once you mention discharge she then becomes suicidal. Pt was also overheard telling a pt she was not suicidal.   A: Pt was offered support and encouragement. Pt was given scheduled medications. Pt was encourage to attend groups. Q 15 minute checks were done for safety.  R:Pt attends groups and interacts well with peers and staff. Pt  taking medication. Pt has no complaints. Pt receptive to treatment and safety maintained on unit.

## 2014-11-21 NOTE — [facility] Group Notes (Signed)
[facility] Group Notes:  (Clinical Social Work)  11/21/2014     10-11AM  Summary of Progress/Problems:   The main focus of today's process group was to learn how to use a decisional balance exercise to move forward in the Stages of Change, which were described and discussed.  Motivational Interviewing and a worksheet were utilized to help patients explore in depth the perceived benefits and costs of unhealthy coping techniques, as well as the  benefits and costs of replacing that with a healthy coping skills.   The patient expressed that her unhealthy coping involves not going through the grief process, as well as using drugs/alcohol.   She participated well initially in group, then left and did not return.  Type of Therapy:  Group Therapy - Process   Participation Level:  Active  Participation Quality:  Attentive and Sharing  Affect:  Appropriate  Cognitive:  Alert  Insight:  Developing/Improving  Engagement in Therapy:  Developing/Improving  Modes of Intervention:  Education, Motivational Interviewing  Ambrose MantleMareida Grossman-Orr, LCSW 11/21/2014, 12:59 PM

## 2014-11-22 MED ORDER — GABAPENTIN 300 MG PO CAPS: ORAL_CAPSULE | ORAL | Status: DC

## 2014-11-22 MED ORDER — PAROXETINE HCL 40 MG PO TABS: 40.0000 mg | ORAL_TABLET | Freq: Every day | ORAL | Status: DC

## 2014-11-22 MED ORDER — HYDROXYZINE HCL 25 MG PO TABS: 25.0000 mg | ORAL_TABLET | Freq: Four times a day (QID) | ORAL | Status: DC | PRN

## 2014-11-22 MED ORDER — PROPRANOLOL HCL 10 MG PO TABS: 10.0000 mg | ORAL_TABLET | Freq: Three times a day (TID) | ORAL | Status: DC

## 2014-11-22 MED ORDER — NICOTINE 21 MG/24HR TD PT24: 21.0000 mg | MEDICATED_PATCH | Freq: Every day | TRANSDERMAL | Status: DC

## 2014-11-22 MED ORDER — PRAZOSIN HCL 1 MG PO CAPS: 1.0000 mg | ORAL_CAPSULE | Freq: Every day | ORAL | Status: DC

## 2014-11-22 MED ORDER — IBUPROFEN 600 MG PO TABS
600.0000 mg | ORAL_TABLET | Freq: Once | ORAL | Status: AC
  Administered 2014-11-22: 600 mg via ORAL
  Filled 2014-11-22 (×2): qty 1

## 2014-11-22 MED ORDER — THIAMINE HCL 100 MG PO TABS: 100.0000 mg | ORAL_TABLET | Freq: Every day | ORAL | Status: DC

## 2014-11-22 MED ORDER — MIRTAZAPINE 30 MG PO TABS: 30.0000 mg | ORAL_TABLET | Freq: Every day | ORAL | Status: DC

## 2014-11-22 MED ORDER — POTASSIUM CHLORIDE CRYS ER 20 MEQ PO TBCR: 20.0000 meq | EXTENDED_RELEASE_TABLET | Freq: Every day | ORAL | Status: DC

## 2014-11-22 NOTE — Progress Notes (Signed)
Patient ID: Alyssa BeckmannLeigh N Cullens, female   DOB: [birth year], 44 y.o.   MRN: [id number]  D: Patient pleasant and cooperative with care. Pt with bright affect and noted to be laughing and joking with peers in the milieu.  A: Q 15 minute safety checks, encourage staff/peer interaction, administer medications as ordered by MD. R: Patient with passive SI, denies plans to harm herself. Pt verbally contracts for safety. Pt compliant with meds and group session.

## 2014-11-22 NOTE — [facility] Group Notes (Signed)
[facility] Group Notes: (Clinical Social Work)   11/22/2014      Type of Therapy:  Group Therapy   Participation Level:  Did Not Attend despite MHT prompting   Ambrose MantleMareida Grossman-Orr, LCSW 11/22/2014, 12:47 PM

## 2014-11-22 NOTE — [facility] Suicide Risk Assessment (Signed)
[facility] Discharge Suicide Risk Assessment   Demographic Factors:  Adolescent or young adult, Caucasian, Low socioeconomic status and Unemployed  Total Time spent with patient: 30 minutes  Musculoskeletal: Strength & Muscle Tone: within normal limits Gait & Station: normal Patient leans: N/A  Psychiatric Specialty Exam: Physical Exam  ROS  Blood pressure 118/55, pulse 84, temperature 98.1 F (36.7 C), temperature source Oral, resp. rate 16, height 5' (1.524 m), weight 93.441 kg (206 lb), last menstrual period 10/21/2014, SpO2 100 %.Body mass index is 40.23 kg/(m^2).  General Appearance: Casual  Eye Contact::  Good  Speech:  Clear and Coherent409  Volume:  Decreased  Mood:  Depressed  Affect:  Congruent and Constricted  Thought Process:  Coherent and Goal Directed  Orientation:  Full (Time, Place, and Person)  Thought Content:  WDL  Suicidal Thoughts:  No  Homicidal Thoughts:  No  Memory:  Immediate;   Good Recent;   Good Remote;   Good  Judgement:  Intact  Insight:  Good  Psychomotor Activity:  Normal  Concentration:  Good  Recall:  Good  Fund of Knowledge:Good  Language: Good  Akathisia:  Negative  Handed:  Right  AIMS (if indicated):     Assets:  Communication Skills Desire for Improvement Housing Intimacy Leisure Time Physical Health Resilience Social Support Talents/Skills Transportation  Sleep:  Number of Hours: 6  Cognition: WNL  ADL's:  Intact   Have you used any form of tobacco in the last 30 days? (Cigarettes, Smokeless Tobacco, Cigars, and/or Pipes): Yes  Has this patient used any form of tobacco in the last 30 days? (Cigarettes, Smokeless Tobacco, Cigars, and/or Pipes) Yes, A prescription for an FDA-approved tobacco cessation medication was offered at discharge and the patient refused  Mental Status Per Nursing Assessment::   On Admission:  Suicidal ideation indicated by patient  Current Mental Status by Physician: patient has completed librium  detox treatment and has plans to go to [facility] and will be placing at [facility] due to previouis environment is infested with drugs of abuse.   Loss Factors: NA  Historical Factors: Family history of mental illness or substance abuse and Impulsivity  Risk Reduction Factors:   Religious beliefs about death, Living with another person, especially a relative, Positive therapeutic relationship and Positive coping skills or problem solving skills  Continued Clinical Symptoms:  Severe Anxiety and/or Agitation Depression:   Comorbid alcohol abuse/dependence Impulsivity Previous Psychiatric Diagnoses and Treatments  Cognitive Features That Contribute To Risk:  Polarized thinking    Suicide Risk:  Minimal: No identifiable suicidal ideation.  Patients presenting with no risk factors but with morbid ruminations; may be classified as minimal risk based on the severity of the depressive symptoms  Principal Problem: <principal problem not specified> Discharge Diagnoses:  Patient Active Problem List   Diagnosis Date Noted  . 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 Alcohol use disorder, severe, dependence [F10.20] 11/12/2014  . Heroin abuse [F11.10] 09/10/2013  . Alcohol abuse [F10.10] 09/10/2013  . Cocaine abuse [F14.10] 09/10/2013    Follow-up Information    Follow up with [facility] On 11/24/2014.   Why:  at 8am for your intitial screening. At this time you will be placed on the waitlist. Please bring photo ID along with social security card.   Contact information:   [address] [place], [place] [address] [phone]      Follow up with [facility].   Why:  Please follow-up with Melissa to  confirm that you are still interested in treatment. You are currently on the waitlist.    Contact information:   [address] [place], [place] [address] [phone]      Plan Of Care/Follow-up recommendations:  Activity:  As  tolerated Diet:  Regular  Is patient on multiple antipsychotic therapies at discharge:  No   Has Patient had three or more failed trials of antipsychotic monotherapy by history:  No  Recommended Plan for Multiple Antipsychotic Therapies: NA    Eugenio Dollins,JANARDHAHA R. 11/22/2014, 2:15 PM

## 2014-11-22 NOTE — Progress Notes (Signed)
Patient ID: Alyssa BeckmannLeigh N Mordecai, female   DOB: 07-24-1970, 44 y.o.   MRN: [id number]  Pt discharged home with a friend. Pt states "he is sober living". Pt was stable and appreciative. All papers and prescriptions were given and valuables returned. Verbal understanding expressed. Denies SI/HI and A/VH. Pt given opportunity to express concerns and ask questions. Pt able to verbalize coping skills for anxiety to utilize outside of the facility.

## 2014-11-22 NOTE — Progress Notes (Signed)
Patient ID: Alyssa Kelley, female   DOB: 04-17-1971, 44 y.o.   MRN: [id number]  Adult Psychoeducational Group Note  Date:  11/22/2014 Time: 0900   Group Topic/Focus:  Making Healthy Choices:   The focus of this group is to help patients identify negative/unhealthy choices they were using prior to admission and identify positive/healthier coping strategies to replace them upon discharge.  Participation Level:  Minimal  Participation Quality:  Attentive  Affect:  Flat  Cognitive:  Alert and Oriented  Insight: Improving  Engagement in Group:  Minimal  Modes of Intervention:  Discussion, Education, Role-play and Support  Additional Comments:  Pt was unable to identify one unhealthy choice and a healthy support system in group today. Pt left group early today.   Aurora Maskwyman, Analiza Cowger E 11/22/2014, 9:53 AM

## 2014-11-22 NOTE — Progress Notes (Signed)
  [facility]  Will you be returning to the same living situation after discharge:  No.  [facility] are available At discharge, do you have transportation home?: Yes,  friend Do you have the ability to pay for your medications: No.  Release of information consent forms completed and in the chart;  Patient's signature needed at discharge.  Patient to Follow up at: Follow-up Information    [facility] On 11/[facility]/2014.   Why:  at 8am for your intitial screening. At this time you will be placed on the waitlist. Please bring photo ID along with social security card.   Contact information:   [address] [place], [place] [address] [phone]      Follow up with [facility].   Why:  Please follow-up with Melissa to confirm that you are still interested in treatment. You are currently on the waitlist.    Contact information:   [address] [place], [place] [address] [phone]      Patient denies SI/HI: Yes,  see Dr. suicide risk assessment at D/C    Safety Planning and Suicide Prevention discussed: Yes,  with boyfriend and with pt  Have you used any form of tobacco in the last 30 days? (Cigarettes, Smokeless Tobacco, Cigars, and/or Pipes): Yes  Has patient been referred to the Quitline?: Patient refused referral  Sarina SerGrossman-Orr, Tru Rana Jo 11/22/2014, 5:26 PM

## 2014-11-22 NOTE — Progress Notes (Signed)
D:  Patient's self inventory sheet, patient has poor sleep, sleep medication is helpful.  Fair appetite, low energy level, poor concentration.  Rated depression and anxiety 10.  Denied withdrawals.  SI, sometime, contracts for safety.  Physical problems all over.  Rated pain 10, pain medication is helpful.  Goal is to leave.  Does have discharge plans.   A:  Medications administered per MD orders.  Emotional support and encouragement given patient. R:  Denied SI while talking to nurse this morning, contracts for safety.  However on self inventory sheet, marked SI.  Denied HI to nurse.  Denied A/V hallucinations.  Denied pain.  Safety maintained with 15 minute checks.

## 2014-12-04 ENCOUNTER — Encounter (HOSPITAL_COMMUNITY): Payer: Self-pay | Admitting: *Deleted

## 2014-12-04 ENCOUNTER — Inpatient Hospital Stay (HOSPITAL_COMMUNITY)
Admission: AD | Admit: 2014-12-04 | Discharge: 2014-12-09 | DRG: 885 | Disposition: A | Discharge status: Home / Self Care | Payer: Federal, State, Local not specified - Other | Source: Intra-hospital | Attending: Psychiatry | Admitting: Psychiatry

## 2014-12-04 ENCOUNTER — Encounter (HOSPITAL_COMMUNITY): Payer: Self-pay

## 2014-12-04 ENCOUNTER — Emergency Department (HOSPITAL_COMMUNITY)
Admission: EM | Admit: 2014-12-04 | Discharge: 2014-12-04 | Disposition: A | Discharge status: Other Acute Inpatient Hospital | Payer: Self-pay | Attending: Emergency Medicine | Admitting: Emergency Medicine

## 2014-12-04 DIAGNOSIS — F1120 Opioid dependence, uncomplicated: Secondary | ICD-10-CM | POA: Diagnosis not present

## 2014-12-04 DIAGNOSIS — F17210 Nicotine dependence, cigarettes, uncomplicated: Secondary | ICD-10-CM | POA: Diagnosis present

## 2014-12-04 DIAGNOSIS — F419 Anxiety disorder, unspecified: Secondary | ICD-10-CM | POA: Insufficient documentation

## 2014-12-04 DIAGNOSIS — F1410 Cocaine abuse, uncomplicated: Secondary | ICD-10-CM | POA: Insufficient documentation

## 2014-12-04 DIAGNOSIS — I10 Essential (primary) hypertension: Secondary | ICD-10-CM | POA: Diagnosis present

## 2014-12-04 DIAGNOSIS — F329 Major depressive disorder, single episode, unspecified: Secondary | ICD-10-CM | POA: Insufficient documentation

## 2014-12-04 DIAGNOSIS — F1024 Alcohol dependence with alcohol-induced mood disorder: Secondary | ICD-10-CM | POA: Diagnosis present

## 2014-12-04 DIAGNOSIS — F4310 Post-traumatic stress disorder, unspecified: Secondary | ICD-10-CM | POA: Diagnosis present

## 2014-12-04 DIAGNOSIS — F410 Panic disorder [episodic paroxysmal anxiety] without agoraphobia: Secondary | ICD-10-CM | POA: Diagnosis present

## 2014-12-04 DIAGNOSIS — Z720 Tobacco use: Secondary | ICD-10-CM | POA: Insufficient documentation

## 2014-12-04 DIAGNOSIS — N390 Urinary tract infection, site not specified: Secondary | ICD-10-CM | POA: Diagnosis present

## 2014-12-04 DIAGNOSIS — G4700 Insomnia, unspecified: Secondary | ICD-10-CM | POA: Diagnosis present

## 2014-12-04 DIAGNOSIS — F1010 Alcohol abuse, uncomplicated: Secondary | ICD-10-CM | POA: Diagnosis present

## 2014-12-04 DIAGNOSIS — R45851 Suicidal ideations: Secondary | ICD-10-CM

## 2014-12-04 DIAGNOSIS — Z79899 Other long term (current) drug therapy: Secondary | ICD-10-CM | POA: Insufficient documentation

## 2014-12-04 DIAGNOSIS — F1310 Sedative, hypnotic or anxiolytic abuse, uncomplicated: Secondary | ICD-10-CM | POA: Insufficient documentation

## 2014-12-04 DIAGNOSIS — Z8619 Personal history of other infectious and parasitic diseases: Secondary | ICD-10-CM | POA: Insufficient documentation

## 2014-12-04 DIAGNOSIS — F332 Major depressive disorder, recurrent severe without psychotic features: Principal | ICD-10-CM | POA: Diagnosis present

## 2014-12-04 DIAGNOSIS — F1110 Opioid abuse, uncomplicated: Secondary | ICD-10-CM | POA: Insufficient documentation

## 2014-12-04 DIAGNOSIS — Z3202 Encounter for pregnancy test, result negative: Secondary | ICD-10-CM | POA: Insufficient documentation

## 2014-12-04 DIAGNOSIS — F1020 Alcohol dependence, uncomplicated: Secondary | ICD-10-CM | POA: Diagnosis not present

## 2014-12-04 HISTORY — DX: Unspecified viral hepatitis C without hepatic coma: B19.20

## 2014-12-04 LAB — RAPID URINE DRUG SCREEN, HOSP PERFORMED
Amphetamines: NOT DETECTED
Barbiturates: NOT DETECTED
Benzodiazepines: POSITIVE — AB
Cocaine: POSITIVE — AB
Opiates: POSITIVE — AB
Tetrahydrocannabinol: NOT DETECTED

## 2014-12-04 LAB — COMPREHENSIVE METABOLIC PANEL
ALBUMIN: 3.9 g/dL (ref 3.5–5.0)
ALK PHOS: 62 U/L (ref 38–126)
ALT: 60 U/L — AB (ref 14–54)
ANION GAP: 8 (ref 5–15)
AST: 67 U/L — ABNORMAL HIGH (ref 15–41)
BUN: 8 mg/dL (ref 6–20)
CO2: 22 mmol/L (ref 22–32)
Calcium: 9 mg/dL (ref 8.9–10.3)
Chloride: 107 mmol/L (ref 101–111)
Creatinine, Ser: 0.68 mg/dL (ref 0.44–1.00)
GFR calc non Af Amer: >60 mL/min (ref >60)
Glucose, Bld: 114 mg/dL — ABNORMAL HIGH (ref 65–99)
POTASSIUM: 3.5 mmol/L (ref 3.5–5.1)
SODIUM: 137 mmol/L (ref 135–145)
Total Bilirubin: 0.8 mg/dL (ref 0.3–1.2)
Total Protein: 8.6 g/dL — ABNORMAL HIGH (ref 6.5–8.1)

## 2014-12-04 LAB — CBC
HCT: 41.6 % (ref 36.0–46.0)
Hemoglobin: 13.6 g/dL (ref 12.0–15.0)
MCH: 30 pg (ref 26.0–34.0)
MCHC: 32.7 g/dL (ref 30.0–36.0)
MCV: 91.8 fL (ref 78.0–100.0)
Platelets: 344 10*3/uL (ref 150–400)
RBC: 4.53 MIL/uL (ref 3.87–5.11)
RDW: 13.8 % (ref 11.5–15.5)
WBC: 8.1 10*3/uL (ref 4.0–10.5)

## 2014-12-04 LAB — POC URINE PREG, ED: Preg Test, Ur: NEGATIVE

## 2014-12-04 LAB — ACETAMINOPHEN LEVEL: Acetaminophen (Tylenol), Serum: <10 ug/mL — ABNORMAL LOW (ref 10–30)

## 2014-12-04 LAB — SALICYLATE LEVEL: Salicylate Lvl: <4 mg/dL (ref 2.8–30.0)

## 2014-12-04 LAB — ETHANOL: Alcohol, Ethyl (B): <5 mg/dL (ref <5)

## 2014-12-04 MED ORDER — ALUM & MAG HYDROXIDE-SIMETH 200-200-20 MG/5ML PO SUSP: 30.0000 mL | ORAL | Status: DC | PRN

## 2014-12-04 MED ORDER — LOPERAMIDE HCL 2 MG PO CAPS
4.0000 mg | ORAL_CAPSULE | ORAL | Status: DC | PRN
  Administered 2014-12-04: 4 mg via ORAL
  Filled 2014-12-04: qty 2

## 2014-12-04 MED ORDER — ONDANSETRON 4 MG PO TBDP
4.0000 mg | ORAL_TABLET | Freq: Four times a day (QID) | ORAL | Status: AC | PRN
  Administered 2014-12-04: 4 mg via ORAL
  Filled 2014-12-04: qty 1

## 2014-12-04 MED ORDER — LORAZEPAM 1 MG PO TABS
1.0000 mg | ORAL_TABLET | Freq: Three times a day (TID) | ORAL | Status: AC
  Administered 2014-12-06 – 2014-12-07 (×3): 1 mg via ORAL
  Filled 2014-12-04 (×3): qty 1

## 2014-12-04 MED ORDER — GABAPENTIN 300 MG PO CAPS
300.0000 mg | ORAL_CAPSULE | Freq: Three times a day (TID) | ORAL | Status: DC
  Administered 2014-12-05 (×2): 300 mg via ORAL
  Filled 2014-12-04 (×9): qty 1

## 2014-12-04 MED ORDER — TRAZODONE HCL 50 MG PO TABS
50.0000 mg | ORAL_TABLET | Freq: Every evening | ORAL | Status: DC | PRN
  Administered 2014-12-04 – 2014-12-06 (×3): 50 mg via ORAL
  Filled 2014-12-04 (×3): qty 1

## 2014-12-04 MED ORDER — CLONIDINE HCL 0.1 MG PO TABS
0.1000 mg | ORAL_TABLET | Freq: Four times a day (QID) | ORAL | Status: DC | PRN
  Administered 2014-12-04: 0.1 mg via ORAL
  Filled 2014-12-04: qty 1

## 2014-12-04 MED ORDER — ACETAMINOPHEN 325 MG PO TABS
650.0000 mg | ORAL_TABLET | Freq: Four times a day (QID) | ORAL | Status: DC | PRN
  Administered 2014-12-05 (×2): 650 mg via ORAL
  Filled 2014-12-04 (×2): qty 2

## 2014-12-04 MED ORDER — LORAZEPAM 1 MG PO TABS: 1.0000 mg | ORAL_TABLET | Freq: Every day | ORAL | Status: DC

## 2014-12-04 MED ORDER — HYDROXYZINE HCL 25 MG PO TABS
25.0000 mg | ORAL_TABLET | Freq: Four times a day (QID) | ORAL | Status: AC | PRN
  Administered 2014-12-05 – 2014-12-07 (×3): 25 mg via ORAL
  Filled 2014-12-04 (×4): qty 1

## 2014-12-04 MED ORDER — THIAMINE HCL 100 MG/ML IJ SOLN: 100.0000 mg | Freq: Once | INTRAMUSCULAR | Status: DC

## 2014-12-04 MED ORDER — PROPRANOLOL HCL 10 MG PO TABS
10.0000 mg | ORAL_TABLET | Freq: Three times a day (TID) | ORAL | Status: DC
  Administered 2014-12-05 – 2014-12-08 (×11): 10 mg via ORAL
  Filled 2014-12-04 (×8): qty 1
  Filled 2014-12-04: qty 42
  Filled 2014-12-04 (×7): qty 1
  Filled 2014-12-04: qty 42
  Filled 2014-12-04: qty 1
  Filled 2014-12-04 (×3): qty 42
  Filled 2014-12-04: qty 1
  Filled 2014-12-04: qty 42

## 2014-12-04 MED ORDER — LORAZEPAM 1 MG PO TABS
1.0000 mg | ORAL_TABLET | Freq: Four times a day (QID) | ORAL | Status: AC | PRN
  Administered 2014-12-06 – 2014-12-07 (×3): 1 mg via ORAL
  Filled 2014-12-04 (×3): qty 1

## 2014-12-04 MED ORDER — NICOTINE 21 MG/24HR TD PT24
21.0000 mg | MEDICATED_PATCH | Freq: Every day | TRANSDERMAL | Status: DC
  Administered 2014-12-04: 21 mg via TRANSDERMAL
  Filled 2014-12-04: qty 1

## 2014-12-04 MED ORDER — PNEUMOCOCCAL VAC POLYVALENT 25 MCG/0.5ML IJ INJ
0.5000 mL | INJECTION | INTRAMUSCULAR | Status: AC
  Administered 2014-12-05: 0.5 mL via INTRAMUSCULAR

## 2014-12-04 MED ORDER — LORAZEPAM 1 MG PO TABS
1.0000 mg | ORAL_TABLET | Freq: Four times a day (QID) | ORAL | Status: AC
  Administered 2014-12-04 – 2014-12-06 (×6): 1 mg via ORAL
  Filled 2014-12-04 (×6): qty 1

## 2014-12-04 MED ORDER — ADULT MULTIVITAMIN W/MINERALS CH
1.0000 | ORAL_TABLET | Freq: Every day | ORAL | Status: DC
  Administered 2014-12-05 – 2014-12-08 (×4): 1 via ORAL
  Filled 2014-12-04 (×7): qty 1

## 2014-12-04 MED ORDER — ZOLPIDEM TARTRATE 5 MG PO TABS: 5.0000 mg | ORAL_TABLET | Freq: Every evening | ORAL | Status: DC | PRN

## 2014-12-04 MED ORDER — VITAMIN B-1 100 MG PO TABS
100.0000 mg | ORAL_TABLET | Freq: Every day | ORAL | Status: DC
  Administered 2014-12-05 – 2014-12-08 (×4): 100 mg via ORAL
  Filled 2014-12-04 (×7): qty 1

## 2014-12-04 MED ORDER — ONDANSETRON HCL 4 MG PO TABS: 4.0000 mg | ORAL_TABLET | Freq: Three times a day (TID) | ORAL | Status: DC | PRN

## 2014-12-04 MED ORDER — PRAZOSIN HCL 1 MG PO CAPS
1.0000 mg | ORAL_CAPSULE | Freq: Every day | ORAL | Status: DC
  Administered 2014-12-04 – 2014-12-08 (×5): 1 mg via ORAL
  Filled 2014-12-04: qty 1
  Filled 2014-12-04: qty 14
  Filled 2014-12-04 (×2): qty 1
  Filled 2014-12-04: qty 14
  Filled 2014-12-04 (×4): qty 1

## 2014-12-04 MED ORDER — ACETAMINOPHEN 325 MG PO TABS: 650.0000 mg | ORAL_TABLET | ORAL | Status: DC | PRN

## 2014-12-04 MED ORDER — LORAZEPAM 1 MG PO TABS
1.0000 mg | ORAL_TABLET | Freq: Three times a day (TID) | ORAL | Status: DC | PRN
  Administered 2014-12-04: 1 mg via ORAL
  Filled 2014-12-04: qty 1

## 2014-12-04 MED ORDER — PAROXETINE HCL 20 MG PO TABS
40.0000 mg | ORAL_TABLET | Freq: Every day | ORAL | Status: DC
  Administered 2014-12-05: 40 mg via ORAL
  Filled 2014-12-04 (×3): qty 2

## 2014-12-04 MED ORDER — MAGNESIUM HYDROXIDE 400 MG/5ML PO SUSP: 30.0000 mL | Freq: Every day | ORAL | Status: DC | PRN

## 2014-12-04 MED ORDER — LORAZEPAM 1 MG PO TABS
1.0000 mg | ORAL_TABLET | Freq: Two times a day (BID) | ORAL | Status: AC
  Administered 2014-12-07 – 2014-12-08 (×2): 1 mg via ORAL
  Filled 2014-12-04 (×2): qty 1

## 2014-12-04 MED ORDER — NICOTINE 21 MG/24HR TD PT24
21.0000 mg | MEDICATED_PATCH | Freq: Every day | TRANSDERMAL | Status: DC
  Administered 2014-12-05 – 2014-12-08 (×4): 21 mg via TRANSDERMAL
  Filled 2014-12-04 (×7): qty 1

## 2014-12-04 MED ORDER — LOPERAMIDE HCL 2 MG PO CAPS
2.0000 mg | ORAL_CAPSULE | ORAL | Status: AC | PRN
  Administered 2014-12-04: 2 mg via ORAL
  Filled 2014-12-04: qty 1

## 2014-12-04 MED ORDER — ENSURE ENLIVE PO LIQD: 237.0000 mL | Freq: Two times a day (BID) | ORAL | Status: DC

## 2014-12-04 MED ORDER — POTASSIUM CHLORIDE CRYS ER 20 MEQ PO TBCR
20.0000 meq | EXTENDED_RELEASE_TABLET | Freq: Every day | ORAL | Status: DC
  Administered 2014-12-05 – 2014-12-08 (×4): 20 meq via ORAL
  Filled 2014-12-04 (×4): qty 1
  Filled 2014-12-04: qty 30
  Filled 2014-12-04: qty 1
  Filled 2014-12-04: qty 30
  Filled 2014-12-04: qty 1

## 2014-12-04 NOTE — ED Provider Notes (Signed)
Pt accepted to [facility], Dr. Dub Mikes. I was not directly involved in dispo planning.  1. Suicidal thoughts      Toy Cookey, MD 12/04/14 [phone]

## 2014-12-04 NOTE — ED Notes (Signed)
Patient seen awake watching TV. Pleasant on approach. Appear a liltle bit anxious and tensed. Denies pain. Reports anxiety and tremor. CIWA 8. CLONIDINE O.1 MG given per order. Patient made aware of her transfer to [facility] - Receptive to that. Will continue to monitor patient.
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
## 2014-12-04 NOTE — ED Notes (Signed)
[place] transportation escorted patient to [facility]. Patient in stable condition. Vital signs within normal.  Belongings with patient.
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
## 2014-12-04 NOTE — Tx Team (Signed)
Initial Interdisciplinary Treatment Plan   PATIENT STRESSORS: Financial difficulties Medication change or noncompliance Substance abuse   PATIENT STRENGTHS: Ability for insight Average or above average intelligence Communication skills General fund of knowledge Motivation for treatment/growth   PROBLEM LIST: Problem List/Patient Goals Date to be addressed Date deferred Reason deferred Estimated date of resolution  "I'm hoping to get inpatient" 12/04/14     "try to get my shit together for once"  12/04/14     Substance abuse 12/04/14     depression 12/04/14     Suicidal ideation 12/04/14                              DISCHARGE CRITERIA:  Improved stabilization in mood, thinking, and/or behavior Withdrawal symptoms are absent or subacute and managed without 24-hour nursing intervention  PRELIMINARY DISCHARGE PLAN: Attend 12-step recovery group Placement in alternative living arrangements  PATIENT/FAMIILY INVOLVEMENT: This treatment plan has been presented to and reviewed with the patient, Alyssa Kelley.  The patient and family have been given the opportunity to ask questions and make suggestions.  Arrie Aran 12/04/2014, 11:11 PM

## 2014-12-04 NOTE — BHH Counselor (Signed)
Pt accepted to [facility] per Berneice Heinrich [facility]. Accepting Dr. Dub Mikes. Can be transported after 9pm. Report [phone]. RN notified.   Kateri Plummer, M.S., LPCA, [place], [facility] Licensed Professional Counselor Associate  Triage Specialist  [facility]  Therapeutic Triage Services Phone: [phone] Fax: [phone]
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
## 2014-12-04 NOTE — ED Notes (Addendum)
PA currently in with pt. 

## 2014-12-04 NOTE — ED Notes (Signed)
Report given to Brandon RN.

## 2014-12-04 NOTE — ED Notes (Signed)
[place] [employer] called  

## 2014-12-04 NOTE — ED Notes (Signed)
Pt changed into scrubs.  Pt and belongings wanded by security.  

## 2014-12-05 ENCOUNTER — Encounter (HOSPITAL_COMMUNITY): Payer: Self-pay | Admitting: Psychiatry

## 2014-12-05 DIAGNOSIS — F332 Major depressive disorder, recurrent severe without psychotic features: Principal | ICD-10-CM

## 2014-12-05 DIAGNOSIS — R45851 Suicidal ideations: Secondary | ICD-10-CM

## 2014-12-05 DIAGNOSIS — F1024 Alcohol dependence with alcohol-induced mood disorder: Secondary | ICD-10-CM | POA: Insufficient documentation

## 2014-12-05 LAB — URINALYSIS, ROUTINE W REFLEX MICROSCOPIC
BILIRUBIN URINE: NEGATIVE
Glucose, UA: NEGATIVE mg/dL
HGB URINE DIPSTICK: NEGATIVE
KETONES UR: NEGATIVE mg/dL
NITRITE: NEGATIVE
Protein, ur: NEGATIVE mg/dL
Specific Gravity, Urine: 1.005 (ref 1.005–1.030)
Urobilinogen, UA: 0.2 mg/dL (ref 0.0–1.0)
pH: 6.5 (ref 5.0–8.0)

## 2014-12-05 LAB — URINE MICROSCOPIC-ADD ON

## 2014-12-05 MED ORDER — MIRTAZAPINE 7.5 MG PO TABS
7.5000 mg | ORAL_TABLET | Freq: Every day | ORAL | Status: DC
  Administered 2014-12-05 – 2014-12-08 (×4): 7.5 mg via ORAL
  Filled 2014-12-05 (×3): qty 1
  Filled 2014-12-05: qty 14
  Filled 2014-12-05: qty 1
  Filled 2014-12-05: qty 14
  Filled 2014-12-05 (×2): qty 1

## 2014-12-05 MED ORDER — PAROXETINE HCL 20 MG PO TABS: 20.0000 mg | ORAL_TABLET | Freq: Every day | ORAL | Status: DC

## 2014-12-05 MED ORDER — GABAPENTIN 100 MG PO CAPS
200.0000 mg | ORAL_CAPSULE | Freq: Two times a day (BID) | ORAL | Status: DC
  Administered 2014-12-06 – 2014-12-08 (×5): 200 mg via ORAL
  Filled 2014-12-05 (×9): qty 2

## 2014-12-05 MED ORDER — IBUPROFEN 600 MG PO TABS
600.0000 mg | ORAL_TABLET | Freq: Four times a day (QID) | ORAL | Status: DC | PRN
  Administered 2014-12-05 – 2014-12-09 (×8): 600 mg via ORAL
  Filled 2014-12-05 (×8): qty 1

## 2014-12-05 MED ORDER — PAROXETINE HCL 20 MG PO TABS
20.0000 mg | ORAL_TABLET | Freq: Every day | ORAL | Status: DC
  Administered 2014-12-06 – 2014-12-08 (×3): 20 mg via ORAL
  Filled 2014-12-05: qty 14
  Filled 2014-12-05 (×6): qty 1
  Filled 2014-12-05: qty 14

## 2014-12-05 NOTE — Progress Notes (Signed)
D:  Per pt self inventory pt reports sleeping is fair, appetite is fair, energy level is poor, rates depression at a 10, rates anxiety at a 10. C/o burning on urination, stopping and going . Pt says she's going to consider going to a long term treatment program.  A:  Support and encouragement provided, encouraged pt to attend all groups and activities, q15 minute checks continued for safety. U/A obtained  R:  Pt is compliant with medications, educated pt on relapse prevention. Encouraged to drink more fluids.
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
## 2014-12-05 NOTE — Progress Notes (Signed)
Patient did attend the evening speaker AA meeting.  

## 2014-12-05 NOTE — [facility] Suicide Risk Assessment (Signed)
[facility] INPATIENT:  Family/Significant Other Suicide Prevention Education  Suicide Prevention Education:  Patient Refusal for Family/Significant Other Suicide Prevention Education: The patient Alyssa Kelley has refused to provide written consent for family/significant other to be provided Family/Significant Other Suicide Prevention Education during admission and/or prior to discharge.  Physician notified.  Sarina Ser 12/05/2014, 3:58 PM

## 2014-12-05 NOTE — [facility] Group Notes (Signed)
[facility] Group Notes:  (Clinical Social Work)  12/05/2014     10-11AM  Summary of Progress/Problems:   The main focus of today's process group was to learn how to use a decisional balance exercise to move forward in the Stages of Change, which were described and discussed.  Motivational Interviewing and a worksheet were utilized to help patients explore in depth the perceived benefits and costs of unhealthy coping techniques, as well as the  benefits and costs of replacing that with a healthy coping skills.   The patient expressed that her unhealthy coping involves substance abuse which she uses to cope with her PTSD.  Type of Therapy:  Group Therapy - Process   Participation Level:  Active  Participation Quality:  Attentive  Affect:  Blunted  Cognitive:  Alert  Insight:  Developing/Improving  Engagement in Therapy:  Developing/Improving  Modes of Intervention:  Education, Motivational Interviewing  Ambrose Mantle, LCSW 12/05/2014, 12:41 PM
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
## 2014-12-05 NOTE — [facility] Group Notes (Signed)
[facility] Group Notes:  (Nursing/MHT/Case Management/Adjunct)  Date:  12/05/2014  Time:  10:51 AM  Type of Therapy:  Nurse Education  Participation Level:  Active  Participation Quality:  Appropriate  Affect:  Anxious and Appropriate  Cognitive:  Alert and Appropriate  Insight:  Appropriate and Improving  Engagement in Group:  Developing/Improving  Modes of Intervention:  Discussion and Education  Summary of Progress/Problems: Group discussion included preparing for discharged and effective coping skills. Pt identified going to [place] would be very peaceful and happy.  Jimmey Ralph 12/05/2014, 10:51 AM

## 2014-12-06 DIAGNOSIS — F1020 Alcohol dependence, uncomplicated: Secondary | ICD-10-CM

## 2014-12-06 LAB — HIV ANTIBODY (ROUTINE TESTING W REFLEX): HIV SCREEN 4TH GENERATION: NONREACTIVE

## 2014-12-06 MED ORDER — ZOLPIDEM TARTRATE 5 MG PO TABS
5.0000 mg | ORAL_TABLET | Freq: Every evening | ORAL | Status: DC | PRN
  Administered 2014-12-06: 5 mg via ORAL
  Filled 2014-12-06: qty 1

## 2014-12-06 NOTE — [facility] Group Notes (Signed)
[facility] Group Notes:  (Clinical Social Work)  12/06/2014  10:00-11:00AM  Summary of Progress/Problems:   The main focus of today's process group was to   1)  discuss the importance of adding supports  2)  identify the patient's current healthy supports  3)  discuss reasons it is so difficult to utilize supports and  4)  begin to plan what supports to add.  An emphasis was placed on changing at least one part of what was in place prior to hospitalization by using counselor, doctor, therapy groups, 12-step groups, a sponsor, and support groups.  The patient said initially that she has no supports, but was able to acknowledge some professional supports in place.  She was very empathetic and supportive of others in group sharing very difficult, traumatizing events in their lives.  Type of Therapy:  Process Group with Motivational Interviewing  Participation Level:  Active  Participation Quality:  Attentive, Sharing and Supportive  Affect:  Appropriate  Cognitive:  Alert, Appropriate and Oriented  Insight:  Engaged  Engagement in Therapy:  Engaged  Modes of Intervention:   Education, Support and Processing, Activity  Ambrose Mantle, LCSW 12/06/2014

## 2014-12-06 NOTE — Progress Notes (Signed)
D.  Pt pleasant on approach, no complaints other than anxiety and wanting to know when she could next have her Ativan.  Positive for evening AA group, interacting appropriately with peers on the unit.  Some passive SI but contracts for safety on the unit.  Denies HI/hallucinations at this time.  A.  Support and encouragement offered, medication given as ordered for anxiety.  R.  Pt remains safe on the unit, will continue to monitor.

## 2014-12-06 NOTE — Progress Notes (Signed)
[facility] MD Progress Note  12/06/2014 5:52 PM Alyssa Kelley  MRN:  [id number]  Subjective: Alyssa Kelley says, "I'm feeling better today"   Objective: Alyssa Kelley is visible on the unit. She is participating in group sessions. Tolerating her medications without adverse effects. She says she will calling around in am to get herself into a rehab facility. She denies any new issues.  Principal Problem: Major depressive disorder, recurrent episode, severe  Diagnosis:   Patient Active Problem List   Diagnosis Date Noted  . Alcohol dependence with alcohol-induced mood disorder [F10.24]   . Major depressive disorder, recurrent episode, severe [F33.2] 12/04/2014  . Opioid use disorder, moderate, dependence [F11.20] 12/04/2014  . 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 Alcohol use disorder, severe, dependence [F10.20] 11/12/2014  . Heroin abuse [F11.10] 09/10/2013  . Alcohol abuse [F10.10] 09/10/2013  . Cocaine abuse [F14.10] 09/10/2013   Total Time spent with patient: 1 hour  Past Medical History:  Past Medical History  Diagnosis Date  . Abscess of mouth   . Heroin abuse   . Anxiety   . Depression   . Hypertension   . Hepatitis C     Past Surgical History  Procedure Laterality Date  . Cesarean section     Family History: History reviewed. No pertinent family history.  Social History:  History  Alcohol Use  . 50.4 oz/week  . 84 Cans of beer per week    Comment: one 12 pack qd     History  Drug Use  . Yes  . Special: Cocaine, Oxycodone, Heroin    Comment: Heroin, benzos    History   Social History  . Marital Status: Single    Spouse Name: N/A  . Number of Children: N/A  . Years of Education: N/A   Social History Main Topics  . Smoking status: Current Every Day Smoker -- 1.00 packs/day for 15 years    Types: Cigarettes  . Smokeless tobacco: Not on file  . Alcohol Use: 50.4 oz/week    84 Cans of beer per week     Comment: one  12 pack qd  . Drug Use: Yes    Special: Cocaine, Oxycodone, Heroin     Comment: Heroin, benzos  . Sexual Activity: Yes    Birth Control/ Protection: None   Other Topics Concern  . None   Social History Narrative   Additional History:    Sleep: Fair  Appetite:  Good  Musculoskeletal: Strength & Muscle Tone: within normal limits Gait & Station: normal Patient leans: N/A  Psychiatric Specialty Exam: Physical Exam  Review of Systems  Constitutional: Negative.   HENT: Negative.   Eyes: Negative.   Respiratory: Negative.   Cardiovascular: Negative.   Gastrointestinal: Negative.   Genitourinary: Negative.   Musculoskeletal: Negative.   Skin: Negative.   Neurological: Negative.   Endo/Heme/Allergies: Negative.   Psychiatric/Behavioral: Positive for depression ("improving") and substance abuse (Polysubstance dependence). Negative for suicidal ideas, hallucinations and memory loss. The patient is not nervous/anxious and does not have insomnia.     Blood pressure 100/58, pulse 78, temperature 97.9 F (36.6 C), temperature source Oral, resp. rate 16, height  (1.499 m), weight 91.627 kg (202 lb), last menstrual period 11/21/2014.Body mass index is 40.78 kg/(m^2).  General Appearance: Casual  Eye Contact::  Good  Speech:  Clear and Coherent  Volume:  Normal  Mood:  Improving  Affect:  Appropriate  Thought Process:  Coherent and Goal Directed  Orientation:  Full (Time, Place, and Person)  Thought Content:  Rumination  Suicidal Thoughts:  No  Homicidal Thoughts:  No  Memory:  Immediate;   Good Recent;   Good Remote;   Good  Judgement:  Fair  Insight:  Lacking  Psychomotor Activity:  Normal  Concentration:  Good  Recall:  Good  Fund of Knowledge:Poor  Language: Good  Akathisia:  No  Handed:  Right  AIMS (if indicated):     Assets:  Desire for Improvement  ADL's:  Intact  Cognition: WNL  Sleep:  Number of Hours: 5.75   Current Medications: Current  Facility-Administered Medications  Medication Dose Route Frequency Provider Last Rate Last Dose  . acetaminophen (TYLENOL) tablet 650 mg  650 mg Oral Q6H PRN Worthy Flank, NP   650 mg at 12/05/14 2138  . alum & mag hydroxide-simeth (MAALOX/MYLANTA) 200-200-20 MG/5ML suspension 30 mL  30 mL Oral Q4H PRN Worthy Flank, NP      . feeding supplement (ENSURE ENLIVE) (ENSURE ENLIVE) liquid 237 mL  237 mL Oral BID BM Rachael Fee, MD   237 mL at 12/05/14 0930  . gabapentin (NEURONTIN) capsule 200 mg  200 mg Oral BID Craige Cotta, MD   200 mg at 12/06/14 1604  . hydrOXYzine (ATARAX/VISTARIL) tablet 25 mg  25 mg Oral Q6H PRN Worthy Flank, NP   25 mg at 12/05/14 4171  . ibuprofen (ADVIL,MOTRIN) tablet 600 mg  600 mg Oral Q6H PRN Sanjuana Kava, NP   600 mg at 12/06/14 2787  . loperamide (IMODIUM) capsule 2-4 mg  2-4 mg Oral PRN Worthy Flank, NP   2 mg at 12/04/14 2302  . LORazepam (ATIVAN) tablet 1 mg  1 mg Oral Q6H PRN Worthy Flank, NP   1 mg at 12/06/14 0618  . LORazepam (ATIVAN) tablet 1 mg  1 mg Oral TID Worthy Flank, NP   1 mg at 12/06/14 1604   Followed by  . [START ON 12/07/2014] LORazepam (ATIVAN) tablet 1 mg  1 mg Oral BID Worthy Flank, NP       Followed by  . [START ON 12/09/2014] LORazepam (ATIVAN) tablet 1 mg  1 mg Oral Daily Worthy Flank, NP      . magnesium hydroxide (MILK OF MAGNESIA) suspension 30 mL  30 mL Oral Daily PRN Worthy Flank, NP      . mirtazapine (REMERON) tablet 7.5 mg  7.5 mg Oral QHS Rockey Situ Cobos, MD   7.5 mg at 12/05/14 2139  . multivitamin with minerals tablet 1 tablet  1 tablet Oral Daily Worthy Flank, NP   1 tablet at 12/06/14 0809  . nicotine (NICODERM CQ - dosed in mg/24 hours) patch 21 mg  21 mg Transdermal Daily Rachael Fee, MD   21 mg at 12/06/14 0814  . ondansetron (ZOFRAN-ODT) disintegrating tablet 4 mg  4 mg Oral Q6H PRN Worthy Flank, NP   4 mg at 12/04/14 2302  . PARoxetine (PAXIL) tablet 20 mg  20 mg Oral Daily Rachael Fee, MD   20 mg at 12/06/14 1836  . potassium chloride SA (K-DUR,KLOR-CON) CR tablet 20 mEq  20 mEq Oral Daily Worthy Flank, NP   20 mEq at 12/06/14 0809  . prazosin (MINIPRESS) capsule 1 mg  1 mg Oral QHS Worthy Flank, NP   1 mg at 12/05/14 2139  . propranolol (INDERAL) tablet 10 mg  10 mg Oral TID Christen Bame  Renelda Loma, NP   10 mg at 12/06/14 1730  . thiamine (B-1) injection 100 mg  100 mg Intramuscular Once Worthy Flank, NP   100 mg at 12/04/14 2303  . thiamine (VITAMIN B-1) tablet 100 mg  100 mg Oral Daily Worthy Flank, NP   100 mg at 12/06/14 0809  . traZODone (DESYREL) tablet 50 mg  50 mg Oral QHS PRN Worthy Flank, NP   50 mg at 12/05/14 2139  . zolpidem (AMBIEN) tablet 5 mg  5 mg Oral QHS PRN Sanjuana Kava, NP        Lab Results:  Results for orders placed or performed during the hospital encounter of 12/04/14 (from the past 48 hour(s))  Urinalysis, Routine w reflex microscopic (not at [facility])     Status: Abnormal   Collection Time: 12/05/14  3:40 PM  Result Value Ref Range   Color, Urine YELLOW YELLOW   APPearance CLOUDY (A) CLEAR   Specific Gravity, Urine 1.005 1.005 - 1.030   pH 6.5 5.0 - 8.0   Glucose, UA NEGATIVE NEGATIVE mg/dL   Hgb urine dipstick NEGATIVE NEGATIVE   Bilirubin Urine NEGATIVE NEGATIVE   Ketones, ur NEGATIVE NEGATIVE mg/dL   Protein, ur NEGATIVE NEGATIVE mg/dL   Urobilinogen, UA 0.2 0.0 - 1.0 mg/dL   Nitrite NEGATIVE NEGATIVE   Leukocytes, UA TRACE (A) NEGATIVE    Comment: Performed at [facility]  Urine microscopic-add on     Status: Abnormal   Collection Time: 12/05/14  3:40 PM  Result Value Ref Range   Squamous Epithelial / LPF MANY (A) RARE    Comment: MANY MANY    WBC, UA 7-10 <3 WBC/hpf    Comment: 7-10 7-10    Bacteria, UA FEW (A) RARE    Comment: FEW FEW Performed at [facility]   HIV antibody     Status: None   Collection Time: 12/06/14  6:24 AM  Result Value Ref Range   HIV Screen 4th  Generation wRfx Non Reactive Non Reactive    Comment: (NOTE) Performed At: [facility] [address] [place], [place] [id number] Mila Homer MD OZ:[id number] Performed at [facility]     Physical Findings: AIMS: Facial and Oral Movements Muscles of Facial Expression: None, normal Lips and Perioral Area: None, normal Jaw: None, normal Tongue: None, normal,Extremity Movements Upper (arms, wrists, hands, fingers): None, normal Lower (legs, knees, ankles, toes): None, normal, Trunk Movements Neck, shoulders, hips: None, normal, Overall Severity Severity of abnormal movements (highest score from questions above): None, normal Incapacitation due to abnormal movements: None, normal Patient's awareness of abnormal movements (rate only patient's report): No Awareness, Dental Status Current problems with teeth and/or dentures?: No Does patient usually wear dentures?: No  CIWA:  CIWA-Ar Total: 5 COWS:  COWS Total Score: 7  Treatment Plan Summary: Daily contact with patient to assess and evaluate symptoms and progress in treatment and Medication management:  Plan: Supportive approach/coping skills/relapse prevention           Depression: will increase the Paroxetine 40 mg, Mirtazapine 7.5 mg & Ambien 5 mg for insomnia, continue to work with mindfulness, CBT help  identify the cognitive distortions that keep the depression going          Discuss other life style changes that can help with both his depression and his alcohol use such like exercise, meditation           Alcohol Dependence: will  continue the detox protocol, will use motivational interviewing and encourage to pursue total abstinence                Continue to educate in terms of AA and other recovery strategies  Medical Decision Making:  Established Problem, Stable/Improving (1), Review of Psycho-Social Stressors (1), Review of Last Therapy Session (1), Review of Medication Regimen & Side Effects  (2) and Review of New Medication or Change in Dosage (2)  Sanjuana Kava, PMHNP-BC 12/06/2014, 5:52 PM   Agree with NP Progress Note as above  Nehemiah Massed , MD

## 2014-12-06 NOTE — [facility] Group Notes (Signed)
The focus of this group is to educate the patient on the purpose and policies of crisis stabilization and provide a format to answer questions about their admission.  The group details unit policies and expectations of patients while admitted.  Patient did not attend 0900 nurse education orientation group this morning.  Patient stayed in bed.   

## 2014-12-06 NOTE — Progress Notes (Signed)
NUTRITION ASSESSMENT  Pt identified as at risk on the Malnutrition Screen Tool  INTERVENTION: 1. Educated patient on the importance of nutrition and encouraged intake of food and beverages. 2. Discussed weight goals. 3. Supplements: continue Ensure Enlive po BID, each supplement provides 350 kcal and 20 grams of protein  NUTRITION DIAGNOSIS: Unintentional weight loss related to sub-optimal intake as evidenced by pt report.   Goal: Pt to meet >/= 90% of their estimated nutrition needs.  Monitor:  PO intake  Assessment:  Pt seen for MST. Ensure Enlive already ordered BID. Pt has been drinking alcohol every day for the past two weeks in addition to drug use. She was admitted for SI.   Pt reports decreased appetite and intakes with 15 lb weight loss in the past 2 weeks. Per weight hx review, pt has lost 4 lbs (2% body weight) in the past month which is not significant for time frame. Prior to this weight loss, per weight hx review, pt was gaining weight.  44 y.o. female  Height: Ht Readings from Last 1 Encounters:  12/04/14 4\' 11"  (1.499 m)    Weight: Wt Readings from Last 1 Encounters:  12/04/14 202 lb (91.627 kg)    Weight Hx: Wt Readings from Last 10 Encounters:  12/04/14 202 lb (91.627 kg)  11/12/14 206 lb (93.441 kg)  11/11/14 175 lb (79.379 kg)  12/22/13 178 lb (80.74 kg)  09/09/13 160 lb (72.576 kg)  11/29/12 185 lb (83.915 kg)  02/09/12 150 lb (68.04 kg)    BMI:  Body mass index is 40.78 kg/(m^2). Pt meets criteria for morbid obesity based on current BMI.  Estimated Nutritional Needs: Kcal: 25-30 kcal/kg Protein: > 1 gram protein/kg Fluid: 1 ml/kcal  Diet Order: Diet regular Room service appropriate?: Yes; Fluid consistency:: Thin Pt is also offered choice of unit snacks mid-morning and mid-afternoon.  Pt is eating as desired.   Lab results and medications reviewed.    Trenton Gammon, RD, LDN Inpatient Clinical Dietitian Pager # [phone] After  hours/weekend pager # [phone]

## 2014-12-06 NOTE — Progress Notes (Signed)
D: Per patient self inventory form, patient reports she slept fair last night with the use of sleep medication. She reports a fair appetite, low energy level, and good concentration. She rates depression 9/10, hopelessness 10/10, and anxiety 10/10- all on 1-10 scale, 10 being the worse. She c/o s/s of withdrawals ( tremors, nausea, irritability.) She c/o bladder pain and reports she gave a urine sample last night. "I'm trying to get into some long term treatment facility, I have been making calls around, hard on weekend; will wait till Monday." Denies HI. Positive for passive SI without a plan, able to verbally contract for safety. Denies AVH.   A: Medication administered per MD order (see eMAR). Special checks q 15 mins in place for safety. Encouragement and counseling provided.   R: Patient did not attend group. Safety maintained. Compliant with medication regimen.

## 2014-12-06 NOTE — Progress Notes (Signed)
Patient did attend the evening speaker AA meeting.  

## 2014-12-06 NOTE — Progress Notes (Signed)
D.  Pt pleasant and appropriate on approach.  Observed laughing and playing cards in dayroom, complaint of anxiety remains.  Pt also continues to endorse passive SI but contracts for safety on the unit.  Denies HI/hallucinations at this time.  Interacting appropriately with peers on the unit, positive for evening AA group.  A.  Support and encouragement offered, medication given as ordered for anxiety.  R.  Pt remains safe on the unit, will continue to monitor.

## 2014-12-07 DIAGNOSIS — N390 Urinary tract infection, site not specified: Secondary | ICD-10-CM | POA: Clinically undetermined

## 2014-12-07 LAB — HEPATITIS B SURFACE ANTIGEN: HEP B S AG: NEGATIVE

## 2014-12-07 MED ORDER — CLONIDINE HCL 0.1 MG PO TABS
0.1000 mg | ORAL_TABLET | Freq: Four times a day (QID) | ORAL | Status: AC
  Administered 2014-12-07 – 2014-12-08 (×5): 0.1 mg via ORAL
  Filled 2014-12-07 (×8): qty 1

## 2014-12-07 MED ORDER — CLONIDINE HCL 0.1 MG PO TABS
0.1000 mg | ORAL_TABLET | ORAL | Status: DC
  Filled 2014-12-07 (×4): qty 1

## 2014-12-07 MED ORDER — TRAZODONE HCL 50 MG PO TABS
50.0000 mg | ORAL_TABLET | Freq: Every evening | ORAL | Status: DC | PRN
  Administered 2014-12-07: 50 mg via ORAL
  Filled 2014-12-07 (×4): qty 1

## 2014-12-07 MED ORDER — NITROFURANTOIN MONOHYD MACRO 100 MG PO CAPS
100.0000 mg | ORAL_CAPSULE | Freq: Two times a day (BID) | ORAL | Status: DC
  Administered 2014-12-07 – 2014-12-09 (×5): 100 mg via ORAL
  Filled 2014-12-07 (×4): qty 1
  Filled 2014-12-07: qty 11
  Filled 2014-12-07 (×3): qty 1
  Filled 2014-12-07 (×3): qty 11
  Filled 2014-12-07: qty 1

## 2014-12-07 MED ORDER — NAPROXEN 500 MG PO TABS
500.0000 mg | ORAL_TABLET | Freq: Two times a day (BID) | ORAL | Status: DC | PRN
  Administered 2014-12-08: 500 mg via ORAL
  Filled 2014-12-07: qty 1

## 2014-12-07 MED ORDER — DICYCLOMINE HCL 20 MG PO TABS: 20.0000 mg | ORAL_TABLET | Freq: Four times a day (QID) | ORAL | Status: DC | PRN

## 2014-12-07 MED ORDER — CLONIDINE HCL 0.1 MG PO TABS
0.1000 mg | ORAL_TABLET | Freq: Every day | ORAL | Status: DC
  Filled 2014-12-07: qty 1

## 2014-12-07 MED ORDER — METHOCARBAMOL 500 MG PO TABS: 500.0000 mg | ORAL_TABLET | Freq: Three times a day (TID) | ORAL | Status: DC | PRN

## 2014-12-07 NOTE — Plan of Care (Signed)
Problem: Alteration in mood Goal: STG-Patient reports thoughts of self-harm to staff Outcome: Progressing Patient is able to report these thoughts to staff and reports she is able to contract for safety.

## 2014-12-07 NOTE — Progress Notes (Signed)
Recreation Therapy Notes  Date: 06.20.16 Time: 9:30 am Location: [address] Group Room  Group Topic: Stress Management  Goal Area(s) Addresses:  Patient will verbalize importance of using healthy stress management.  Patient will identify positive emotions associated with healthy stress management.   Intervention: Stress Management  Activity :  Guided Imagery.  LRT introduced and educated patients on stress management technique of guided imagery.  A script was used to deliver the technique to patients.  Patients were asked to follow script read a loud by LRT to engage in practicing the stress management technique.  Education:  Stress Management, Discharge Planning.   Clinical Observations/Feedback: Patient did not attend group.   Maanasa Aderhold, LRT/CTRS         Daden Mahany A 12/07/2014 4:19 PM 

## 2014-12-07 NOTE — Progress Notes (Addendum)
[facility] MD Progress Note  12/07/2014 2:46 PM Alyssa Kelley  MRN:  [id number]  Subjective: Alyssa Kelley states" I have pain while passing urine , I have a UTI , I have been telling them and they did not give me anything for this.'   Objective: Patient seen and chart reviewed.Discussed patient with treatment team.  Diamante seen in bed this AM. She appears anxious about her urinary sx . Pt with hx of heroin, alcohol abuse , continues to have anxiety sx. Pt tolerating her medications well, denies any side effects. CIWA reviewed- VS reviewed.Pt continues to have some withdrawal sx - which she reports as nausea, restlessness, anxiety. Pt continues to have passive SI , contracts for safety. Per staff - no disruptive issues noted on the unit. .  Principal Problem: Major depressive disorder, recurrent episode, severe  Diagnosis:   Patient Active Problem List   Diagnosis Date Noted  . UTI (urinary tract infection) [N39.0] 12/07/2014  . Alcohol dependence with alcohol-induced mood disorder [F10.24]   . Major depressive disorder, recurrent episode, severe [F33.2] 12/04/2014  . Opioid use disorder, moderate, dependence [F11.20] 12/04/2014  . Alcohol use disorder, severe, dependence [F10.20] 11/12/2014  . Alcohol abuse [F10.10] 09/10/2013  . Cocaine abuse [F14.10] 09/10/2013   Total Time spent with patient: 30 minutes  Past Medical History:  Past Medical History  Diagnosis Date  . Abscess of mouth   . Heroin abuse   . Anxiety   . Depression   . Hypertension   . Hepatitis C     Past Surgical History  Procedure Laterality Date  . Cesarean section     Family History: History reviewed. No pertinent family history.  Social History:  History  Alcohol Use  . 50.4 oz/week  . 84 Cans of beer per week    Comment: one 12 pack qd     History  Drug Use  . Yes  . Special: Cocaine, Oxycodone, Heroin    Comment: Heroin, benzos    History   Social History  . Marital Status: Single    Spouse Name:  N/A  . Number of Children: N/A  . Years of Education: N/A   Social History Main Topics  . Smoking status: Current Every Day Smoker -- 1.00 packs/day for 15 years    Types: Cigarettes  . Smokeless tobacco: Not on file  . Alcohol Use: 50.4 oz/week    84 Cans of beer per week     Comment: one 12 pack qd  . Drug Use: Yes    Special: Cocaine, Oxycodone, Heroin     Comment: Heroin, benzos  . Sexual Activity: Yes    Birth Control/ Protection: None   Other Topics Concern  . None   Social History Narrative   Additional History:    Sleep: Fair  Appetite:  Good  Musculoskeletal: Strength & Muscle Tone: within normal limits Gait & Station: normal Patient leans: N/A  Psychiatric Specialty Exam: Physical Exam  Review of Systems  Constitutional: Positive for malaise/fatigue.  HENT: Negative.   Eyes: Negative.   Respiratory: Negative.   Cardiovascular: Negative.   Gastrointestinal: Positive for nausea.  Genitourinary: Negative.   Musculoskeletal: Negative.   Skin: Negative.   Neurological: Negative.   Endo/Heme/Allergies: Negative.   Psychiatric/Behavioral: Positive for depression ("improving"), suicidal ideas and substance abuse (Polysubstance dependence). Negative for hallucinations and memory loss. The patient is nervous/anxious. The patient does not have insomnia.   All other systems reviewed and are negative.   Blood pressure 103/62, pulse 67,  temperature 98.6 F (37 C), temperature source Oral, resp. rate 18, height 4\' 11"  (1.499 m), weight 91.627 kg (202 lb), last menstrual period 11/21/2014.Body mass index is 40.78 kg/(m^2).  General Appearance: Casual  Eye Contact::  Good  Speech:  Normal Rate  Volume:  Normal  Mood:  Anxious and Depressed  Affect:  Appropriate  Thought Process:  Goal Directed  Orientation:  Full (Time, Place, and Person)  Thought Content:  Rumination  Suicidal Thoughts:  Yes.  without intent/plan  Homicidal Thoughts:  No  Memory:  Immediate;    Good Recent;   Good Remote;   Good  Judgement:  Fair  Insight:  Lacking  Psychomotor Activity:  Restlessness  Concentration:  Poor  Recall:  Good  Fund of Knowledge:Fair  Language: Good  Akathisia:  No  Handed:  Right  AIMS (if indicated):     Assets:  Desire for Improvement  ADL's:  Intact  Cognition: WNL  Sleep:  Number of Hours: 5.75   Current Medications: Current Facility-Administered Medications  Medication Dose Route Frequency Provider Last Rate Last Dose  . acetaminophen (TYLENOL) tablet 650 mg  650 mg Oral Q6H PRN Worthy Flank, NP   650 mg at 12/05/14 2138  . alum & mag hydroxide-simeth (MAALOX/MYLANTA) 200-200-20 MG/5ML suspension 30 mL  30 mL Oral Q4H PRN Worthy Flank, NP      . cloNIDine (CATAPRES) tablet 0.1 mg  0.1 mg Oral QID Jomarie Longs, MD   0.1 mg at 12/07/14 1220   Followed by  . [START ON 12/09/2014] cloNIDine (CATAPRES) tablet 0.1 mg  0.1 mg Oral BH-qamhs Jomarie Longs, MD       Followed by  . [START ON 12/11/2014] cloNIDine (CATAPRES) tablet 0.1 mg  0.1 mg Oral QAC breakfast Sadi Arave, MD      . dicyclomine (BENTYL) tablet 20 mg  20 mg Oral Q6H PRN Taneasha Fuqua, MD      . feeding supplement (ENSURE ENLIVE) (ENSURE ENLIVE) liquid 237 mL  237 mL Oral BID BM Rachael Fee, MD   237 mL at 12/05/14 0930  . gabapentin (NEURONTIN) capsule 200 mg  200 mg Oral BID Craige Cotta, MD   200 mg at 12/07/14 0844  . hydrOXYzine (ATARAX/VISTARIL) tablet 25 mg  25 mg Oral Q6H PRN Worthy Flank, NP   25 mg at 12/07/14 1113  . ibuprofen (ADVIL,MOTRIN) tablet 600 mg  600 mg Oral Q6H PRN Sanjuana Kava, NP   600 mg at 12/07/14 0848  . loperamide (IMODIUM) capsule 2-4 mg  2-4 mg Oral PRN Worthy Flank, NP   2 mg at 12/04/14 2302  . LORazepam (ATIVAN) tablet 1 mg  1 mg Oral Q6H PRN Worthy Flank, NP   1 mg at 12/07/14 0617  . LORazepam (ATIVAN) tablet 1 mg  1 mg Oral BID Worthy Flank, NP       Followed by  . [START ON 12/09/2014] LORazepam (ATIVAN) tablet 1  mg  1 mg Oral Daily Worthy Flank, NP      . magnesium hydroxide (MILK OF MAGNESIA) suspension 30 mL  30 mL Oral Daily PRN Worthy Flank, NP      . methocarbamol (ROBAXIN) tablet 500 mg  500 mg Oral Q8H PRN Demita Tobia, MD      . mirtazapine (REMERON) tablet 7.5 mg  7.5 mg Oral QHS Rockey Situ Cobos, MD   7.5 mg at 12/06/14 2201  . multivitamin with minerals tablet 1 tablet  1 tablet Oral Daily Worthy Flank, NP   1 tablet at 12/07/14 0844  . naproxen (NAPROSYN) tablet 500 mg  500 mg Oral BID PRN Jomarie Longs, MD      . nicotine (NICODERM CQ - dosed in mg/24 hours) patch 21 mg  21 mg Transdermal Daily Rachael Fee, MD   21 mg at 12/07/14 0845  . nitrofurantoin (macrocrystal-monohydrate) (MACROBID) capsule 100 mg  100 mg Oral Q12H Donita Newland, MD   100 mg at 12/07/14 1253  . ondansetron (ZOFRAN-ODT) disintegrating tablet 4 mg  4 mg Oral Q6H PRN Worthy Flank, NP   4 mg at 12/04/14 2302  . PARoxetine (PAXIL) tablet 20 mg  20 mg Oral Daily Rachael Fee, MD   20 mg at 12/07/14 0844  . potassium chloride SA (K-DUR,KLOR-CON) CR tablet 20 mEq  20 mEq Oral Daily Worthy Flank, NP   20 mEq at 12/07/14 0844  . prazosin (MINIPRESS) capsule 1 mg  1 mg Oral QHS Worthy Flank, NP   1 mg at 12/06/14 2201  . propranolol (INDERAL) tablet 10 mg  10 mg Oral TID Worthy Flank, NP   10 mg at 12/07/14 1218  . thiamine (B-1) injection 100 mg  100 mg Intramuscular Once Worthy Flank, NP   100 mg at 12/04/14 2303  . thiamine (VITAMIN B-1) tablet 100 mg  100 mg Oral Daily Worthy Flank, NP   100 mg at 12/07/14 0844  . traZODone (DESYREL) tablet 50 mg  50 mg Oral QHS,MR X 1 Friend Dorfman, MD        Lab Results:  Results for orders placed or performed during the hospital encounter of 12/04/14 (from the past 48 hour(s))  Urinalysis, Routine w reflex microscopic (not at [facility])     Status: Abnormal   Collection Time: 12/05/14  3:40 PM  Result Value Ref Range   Color, Urine YELLOW YELLOW    APPearance CLOUDY (A) CLEAR   Specific Gravity, Urine 1.005 1.005 - 1.030   pH 6.5 5.0 - 8.0   Glucose, UA NEGATIVE NEGATIVE mg/dL   Hgb urine dipstick NEGATIVE NEGATIVE   Bilirubin Urine NEGATIVE NEGATIVE   Ketones, ur NEGATIVE NEGATIVE mg/dL   Protein, ur NEGATIVE NEGATIVE mg/dL   Urobilinogen, UA 0.2 0.0 - 1.0 mg/dL   Nitrite NEGATIVE NEGATIVE   Leukocytes, UA TRACE (A) NEGATIVE    Comment: Performed at [facility]  Urine microscopic-add on     Status: Abnormal   Collection Time: 12/05/14  3:40 PM  Result Value Ref Range   Squamous Epithelial / LPF MANY (A) RARE    Comment: MANY MANY    WBC, UA 7-10 <3 WBC/hpf    Comment: 7-10 7-10    Bacteria, UA FEW (A) RARE    Comment: FEW FEW Performed at [facility]   HIV antibody     Status: None   Collection Time: 12/06/14  6:24 AM  Result Value Ref Range   HIV Screen 4th Generation wRfx Non Reactive Non Reactive    Comment: (NOTE) Performed At: [facility] [address] [place], [place] [id number] Mila Homer MD WU:[id number] Performed at [facility]     Physical Findings: AIMS: Facial and Oral Movements Muscles of Facial Expression: None, normal Lips and Perioral Area: None, normal Jaw: None, normal Tongue: None, normal,Extremity Movements Upper (arms, wrists, hands, fingers): None, normal Lower (legs, knees, ankles, toes): None, normal, Trunk Movements Neck,  shoulders, hips: None, normal, Overall Severity Severity of abnormal movements (highest score from questions above): None, normal Incapacitation due to abnormal movements: None, normal Patient's awareness of abnormal movements (rate only patient's report): No Awareness, Dental Status Current problems with teeth and/or dentures?: No Does patient usually wear dentures?: No  CIWA:  CIWA-Ar Total: 4 COWS:  COWS Total Score: 7  Treatment Plan Summary: Daily contact with patient to assess and  evaluate symptoms and progress in treatment and Medication management:  Plan: Supportive approach/coping skills/relapse prevention           Depression: will continue Paroxetine 20 mg, Mirtazapine 7.5 mg  for depression and increase Trazodone to 50 mg po qhs x1 dose repeat x1 dose prn for insomnia.DC Ambien .           Continue to work with mindfulness, CBT help  identify the cognitive distortions that keep the depression going          Discuss other life style changes that can help with both his depression and his alcohol use such like exercise, meditation           Alcohol Dependence: will continue the detox protocol, will use motivational interviewing and encourage to pursue total abstinence           Continue to educate in terms of AA and other recovery strategies.           Opioid use disorder - start COWS protocol. Pt continues to report withdrawal sx- used heroin everyday.           Start Macrobid 100 mg po bid for UTI.           CSW will work on disposition.  Medical Decision Making:  Established Problem, Stable/Improving (1), Review of Psycho-Social Stressors (1), Review of Last Therapy Session (1), Review of Medication Regimen & Side Effects (2) and Review of New Medication or Change in Dosage (2)  Colson Barco MD 12/07/2014, 2:46 PM

## 2014-12-07 NOTE — [facility] Group Notes (Signed)
[facility] LCSW Aftercare Discharge Planning Group Note  12/07/2014 8:45 AM  Pt did not attend, declined invitation.  Chad Cordial, LCSWA 12/07/2014 9:44 AM

## 2014-12-07 NOTE — Progress Notes (Signed)
Patient in bed resting at the beginning of the shift. Her mood and affect flat and depressed. She reported that she has UTI but she has been started on antibiotic. She also reported [facility] but she has to call back at 10 pm and speak with the residents in the house. She endorsed SI but verbally contracted for safety. She also stated that she has been having Withdrawal symptoms such as tremors, diarrhea, irritability and hot/cold. Writer encouraged and supported patient and she is to receive some medications for the symptom of withdrawals.

## 2014-12-07 NOTE — Progress Notes (Signed)
Per Pt request, CSW faxed referrals to [facility], [facility], and [facility].  Fax included: demographics, H&P, labs, vitals, and MAR.   Chad Cordial, Theresia Majors [phone]

## 2014-12-07 NOTE — Progress Notes (Signed)
Patient ID: Alyssa Kelley, female   DOB: September 20, 1970, 44 y.o.   MRN: [id number]  D: Pt. Denies HI and A/V Hallucinations. Patient endorses passive SI but is able to contract for safety. She reports sleep for her last night was fair, appetite is fair, energy level is low, and concentration level is good. She rates her depression, anxiety, and hopelessness at 9/10. Patient reports pain she states is related to "a UTI I have." Patient was started on antibiotic by practitioner. Patient reported, "that will help." Patient also received PRN Ibuprofen this morning and vistaril for anxiety. Support and encouragement provided to the patient. Scheduled medications administered to patient per physician's orders. Patient is receptive and cooperative. Patient is seen in the milieu interacting with peers and is attending some groups. Patient reports she wants to leave here and go to residential treatment. Patient states, "I've been on the phone all weekend leaving messages." Q15 minute checks are maintained for safety.
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
## 2014-12-08 DIAGNOSIS — F1024 Alcohol dependence with alcohol-induced mood disorder: Secondary | ICD-10-CM

## 2014-12-08 DIAGNOSIS — F1120 Opioid dependence, uncomplicated: Secondary | ICD-10-CM

## 2014-12-08 MED ORDER — LORAZEPAM 1 MG PO TABS
1.0000 mg | ORAL_TABLET | Freq: Four times a day (QID) | ORAL | Status: AC | PRN
  Administered 2014-12-08 – 2014-12-09 (×2): 1 mg via ORAL
  Filled 2014-12-08 (×2): qty 1

## 2014-12-08 MED ORDER — TRAZODONE HCL 100 MG PO TABS: 100.0000 mg | ORAL_TABLET | Freq: Every evening | ORAL | Status: DC | PRN

## 2014-12-08 MED ORDER — PROPRANOLOL HCL 10 MG PO TABS: 10.0000 mg | ORAL_TABLET | Freq: Three times a day (TID) | ORAL | Status: DC

## 2014-12-08 MED ORDER — PRAZOSIN HCL 1 MG PO CAPS: 1.0000 mg | ORAL_CAPSULE | Freq: Every day | ORAL | Status: DC

## 2014-12-08 MED ORDER — PAROXETINE HCL 20 MG PO TABS: 20.0000 mg | ORAL_TABLET | Freq: Every day | ORAL | Status: DC

## 2014-12-08 MED ORDER — HYDROXYZINE HCL 25 MG PO TABS
25.0000 mg | ORAL_TABLET | Freq: Three times a day (TID) | ORAL | Status: DC
  Administered 2014-12-08 (×2): 25 mg via ORAL
  Filled 2014-12-08: qty 1
  Filled 2014-12-08: qty 42
  Filled 2014-12-08: qty 1
  Filled 2014-12-08 (×3): qty 42
  Filled 2014-12-08 (×3): qty 1
  Filled 2014-12-08 (×2): qty 42

## 2014-12-08 MED ORDER — NITROFURANTOIN MONOHYD MACRO 100 MG PO CAPS: 100.0000 mg | ORAL_CAPSULE | Freq: Two times a day (BID) | ORAL | Status: DC

## 2014-12-08 MED ORDER — ZOLPIDEM TARTRATE 10 MG PO TABS
10.0000 mg | ORAL_TABLET | Freq: Once | ORAL | Status: AC
  Administered 2014-12-08: 10 mg via ORAL
  Filled 2014-12-08: qty 1

## 2014-12-08 MED ORDER — MIRTAZAPINE 7.5 MG PO TABS: 7.5000 mg | ORAL_TABLET | Freq: Every day | ORAL | Status: DC

## 2014-12-08 MED ORDER — POTASSIUM CHLORIDE CRYS ER 20 MEQ PO TBCR: 20.0000 meq | EXTENDED_RELEASE_TABLET | Freq: Every day | ORAL | Status: DC

## 2014-12-08 MED ORDER — HYDROXYZINE HCL 25 MG PO TABS: 25.0000 mg | ORAL_TABLET | Freq: Three times a day (TID) | ORAL | Status: DC

## 2014-12-08 MED ORDER — TRAZODONE HCL 100 MG PO TABS
100.0000 mg | ORAL_TABLET | Freq: Every evening | ORAL | Status: DC | PRN
  Administered 2014-12-08: 100 mg via ORAL
  Filled 2014-12-08 (×2): qty 1
  Filled 2014-12-08 (×4): qty 28
  Filled 2014-12-08 (×2): qty 1

## 2014-12-08 MED ORDER — GABAPENTIN 300 MG PO CAPS: 300.0000 mg | ORAL_CAPSULE | Freq: Two times a day (BID) | ORAL | Status: DC

## 2014-12-08 MED ORDER — GABAPENTIN 300 MG PO CAPS
300.0000 mg | ORAL_CAPSULE | Freq: Two times a day (BID) | ORAL | Status: DC
  Administered 2014-12-08: 300 mg via ORAL
  Filled 2014-12-08 (×3): qty 1
  Filled 2014-12-08 (×4): qty 28
  Filled 2014-12-08: qty 1

## 2014-12-08 NOTE — [facility] Group Notes (Signed)
[facility] Group Therapy 12/08/2014 1:15pm  Type of Therapy: Mental Health Association Presentation  Pt did not attend, declined invitation.   Chad Cordial, LCSWA 12/08/2014 1:32 PM

## 2014-12-08 NOTE — Progress Notes (Signed)
  [facility] Adult Case Management Discharge Plan :  Will you be returning to the same living situation after discharge:  No. Pt is going to [facility] for continued substance abuse treatment. At discharge, do you have transportation home?: Yes,  Pt provided with bus pass. Do you have the ability to pay for your medications: Yes,  provided with samples and prescriptions  Release of information consent forms completed and in the chart;  Patient's signature needed at discharge.  Patient to Follow up at: Follow-up Information    Follow up with [facility] On 12/09/2014.   Why:  at 8:00am for your initial screening. Please wait at the [facility] at the [facility] on [address] [facility] [address] [facility]. Please call [facility] staff at the number below to let them know you arrived and where they can find you.    Contact information:   [address] [place], [place] [address] [phone]      Patient denies SI/HI: Yes,  Pt denies    Safety Planning and Suicide Prevention discussed: Yes,  with Pt. Declined family contact  Have you used any form of tobacco in the last 30 days? (Cigarettes, Smokeless Tobacco, Cigars, and/or Pipes): Yes  Has patient been referred to the Quitline?: Patient refused referral  Elaina Hoops 12/08/2014, 1:38 PM

## 2014-12-08 NOTE — Progress Notes (Signed)
Recreation Therapy Notes  Animal-Assisted Activity (AAA) Program Checklist/Progress Notes Patient Eligibility Criteria Checklist & Daily Group note for Rec Tx Intervention  Date: 06.21.16 Time: 2:30 pm Location: [address] Dayroom  AAA/T Program Assumption of Risk Form signed by Patient/ or Parent Legal Guardian yes  Patient is free of allergies or sever asthma yes  Patient reports no fear of animals yes  Patient reports no history of cruelty to animalsyes  Patient understands his/her participation is voluntary yes  Patient washes hands before animal contact yes  Patient washes hands after animal contact yes  Education: Hand Washing, Appropriate Animal Interaction   Clinical Observations/Feedback: Patient did not attend group.   Alyssa Kelley, LRT/CTRS         Alyssa Kelley 12/08/2014 4:12 PM 

## 2014-12-08 NOTE — Progress Notes (Signed)
[facility] MD Progress Note  12/08/2014 4:14 PM Alyssa Kelley  MRN:  [id number] Subjective:  Alyssa Kelley states that she was not able to hold for to long when she left last time. She relapsed on alcohol heroin. States she really needs the help. She states she kept calling [facility] every day and she gave up. She is hopeful to be able to get to [facility] tomorrow,. She states she did not sleep last night. States that even if for one night she would like to have the Ambien back Principal Problem: Major depressive disorder, recurrent episode, severe Diagnosis:   Patient Active Problem List   Diagnosis Date Noted  . UTI (urinary tract infection) [N39.0] 12/07/2014  . Alcohol dependence with alcohol-induced mood disorder [F10.24]   . Major depressive disorder, recurrent episode, severe [F33.2] 12/04/2014  . Opioid use disorder, moderate, dependence [F11.20] 12/04/2014  . Alcohol use disorder, severe, dependence [F10.20] 11/12/2014  . Alcohol abuse [F10.10] 09/10/2013  . Cocaine abuse [F14.10] 09/10/2013   Total Time spent with patient: 30 minutes   Past Medical History:  Past Medical History  Diagnosis Date  . Abscess of mouth   . Heroin abuse   . Anxiety   . Depression   . Hypertension   . Hepatitis C     Past Surgical History  Procedure Laterality Date  . Cesarean section     Family History: History reviewed. No pertinent family history. Social History:  History  Alcohol Use  . 50.4 oz/week  . 84 Cans of beer per week    Comment: one 12 pack qd     History  Drug Use  . Yes  . Special: Cocaine, Oxycodone, Heroin    Comment: Heroin, benzos    History   Social History  . Marital Status: Single    Spouse Name: N/A  . Number of Children: N/A  . Years of Education: N/A   Social History Main Topics  . Smoking status: Current Every Day Smoker -- 1.00 packs/day for 15 years    Types: Cigarettes  . Smokeless tobacco: Not on file  . Alcohol Use: 50.4 oz/week    84 Cans of beer per week    Comment: one 12 pack qd  . Drug Use: Yes    Special: Cocaine, Oxycodone, Heroin     Comment: Heroin, benzos  . Sexual Activity: Yes    Birth Control/ Protection: None   Other Topics Concern  . None   Social History Narrative   Additional History:    Sleep: Poor  Appetite:  Fair   Assessment:   Musculoskeletal: Strength & Muscle Tone: within normal limits Gait & Station: normal Patient leans: normal   Psychiatric Specialty Exam: Physical Exam  Review of Systems  Constitutional: Negative.   HENT: Negative.   Eyes: Negative.   Respiratory: Negative.   Cardiovascular: Negative.   Gastrointestinal: Negative.   Genitourinary: Negative.   Skin: Negative.   Neurological: Negative.   Endo/Heme/Allergies: Negative.   Psychiatric/Behavioral: Positive for depression and substance abuse. The patient is nervous/anxious.     Blood pressure 106/73, pulse 66, temperature 98 F (36.7 C), temperature source Oral, resp. rate 18, height  (1.499 m), weight 91.627 kg (202 lb), last menstrual period 11/21/2014.Body mass index is 40.78 kg/(m^2).  General Appearance: Fairly Groomed  Patent attorney::  Fair  Speech:  Clear and Coherent  Volume:  Normal  Mood:  Anxious and worried  Affect:  anxious worried  Thought Process:  Coherent and Goal Directed  Orientation:  Full (Time, Place, and Person)  Thought Content:  symptoms envents worries concerns  Suicidal Thoughts:  No  Homicidal Thoughts:  No  Memory:  Immediate;   Fair Recent;   Fair Remote;   Fair  Judgement:  Fair  Insight:  Present  Psychomotor Activity:  Restlessness  Concentration:  Fair  Recall:  Alyssa Kelley:Fair  Language: Fair  Akathisia:  No  Handed:  Right  AIMS (if indicated):     Assets:  Desire for Improvement  ADL's:  Intact  Cognition: WNL  Sleep:  Number of Hours: 5.75     Current Medications: Current Facility-Administered Medications  Medication Dose Route Frequency Provider Last  Rate Last Dose  . acetaminophen (TYLENOL) tablet 650 mg  650 mg Oral Q6H PRN Worthy Flank, NP   650 mg at 12/05/14 2138  . alum & mag hydroxide-simeth (MAALOX/MYLANTA) 200-200-20 MG/5ML suspension 30 mL  30 mL Oral Q4H PRN Worthy Flank, NP      . cloNIDine (CATAPRES) tablet 0.1 mg  0.1 mg Oral QID Jomarie Longs, MD   0.1 mg at 12/08/14 1159   Followed by  . [START ON 12/09/2014] cloNIDine (CATAPRES) tablet 0.1 mg  0.1 mg Oral BH-qamhs Jomarie Longs, MD       Followed by  . [START ON 12/11/2014] cloNIDine (CATAPRES) tablet 0.1 mg  0.1 mg Oral QAC breakfast Saramma Eappen, MD      . dicyclomine (BENTYL) tablet 20 mg  20 mg Oral Q6H PRN Saramma Eappen, MD      . feeding supplement (ENSURE ENLIVE) (ENSURE ENLIVE) liquid 237 mL  237 mL Oral BID BM Rachael Fee, MD   237 mL at 12/05/14 0930  . gabapentin (NEURONTIN) capsule 300 mg  300 mg Oral BID Rachael Fee, MD      . hydrOXYzine (ATARAX/VISTARIL) tablet 25 mg  25 mg Oral TID Rachael Fee, MD   25 mg at 12/08/14 1159  . ibuprofen (ADVIL,MOTRIN) tablet 600 mg  600 mg Oral Q6H PRN Sanjuana Kava, NP   600 mg at 12/08/14 0813  . [START ON 12/09/2014] LORazepam (ATIVAN) tablet 1 mg  1 mg Oral Daily Worthy Flank, NP      . LORazepam (ATIVAN) tablet 1 mg  1 mg Oral Q6H PRN Rachael Fee, MD      . magnesium hydroxide (MILK OF MAGNESIA) suspension 30 mL  30 mL Oral Daily PRN Worthy Flank, NP      . methocarbamol (ROBAXIN) tablet 500 mg  500 mg Oral Q8H PRN Jomarie Longs, MD      . mirtazapine (REMERON) tablet 7.5 mg  7.5 mg Oral QHS Rockey Situ Cobos, MD   7.5 mg at 12/07/14 2239  . multivitamin with minerals tablet 1 tablet  1 tablet Oral Daily Worthy Flank, NP   1 tablet at 12/08/14 0813  . naproxen (NAPROSYN) tablet 500 mg  500 mg Oral BID PRN Jomarie Longs, MD      . nicotine (NICODERM CQ - dosed in mg/24 hours) patch 21 mg  21 mg Transdermal Daily Rachael Fee, MD   21 mg at 12/08/14 0813  . nitrofurantoin (macrocrystal-monohydrate)  (MACROBID) capsule 100 mg  100 mg Oral Q12H Saramma Eappen, MD   100 mg at 12/08/14 0813  . PARoxetine (PAXIL) tablet 20 mg  20 mg Oral Daily Rachael Fee, MD   20 mg at 12/08/14 0813  . potassium chloride SA (K-DUR,KLOR-CON) CR tablet  20 mEq  20 mEq Oral Daily Worthy Flank, NP   20 mEq at 12/08/14 0813  . prazosin (MINIPRESS) capsule 1 mg  1 mg Oral QHS Worthy Flank, NP   1 mg at 12/07/14 2239  . propranolol (INDERAL) tablet 10 mg  10 mg Oral TID Worthy Flank, NP   10 mg at 12/08/14 1159  . thiamine (B-1) injection 100 mg  100 mg Intramuscular Once Worthy Flank, NP   100 mg at 12/04/14 2303  . thiamine (VITAMIN B-1) tablet 100 mg  100 mg Oral Daily Worthy Flank, NP   100 mg at 12/08/14 0813  . traZODone (DESYREL) tablet 100 mg  100 mg Oral QHS,MR X 1 Rachael Fee, MD      . zolpidem [facility]) tablet 10 mg  10 mg Oral Once Rachael Fee, MD        Lab Results: No results found for this or any previous visit (from the past 48 hour(s)).  Physical Findings: AIMS: Facial and Oral Movements Muscles of Facial Expression: None, normal Lips and Perioral Area: None, normal Jaw: None, normal Tongue: None, normal,Extremity Movements Upper (arms, wrists, hands, fingers): None, normal Lower (legs, knees, ankles, toes): None, normal, Trunk Movements Neck, shoulders, hips: None, normal, Overall Severity Severity of abnormal movements (highest score from questions above): None, normal Incapacitation due to abnormal movements: None, normal Patient's awareness of abnormal movements (rate only patient's report): No Awareness, Dental Status Current problems with teeth and/or dentures?: No Does patient usually wear dentures?: No  CIWA:  CIWA-Ar Total: 0 COWS:  COWS Total Score: 0  Treatment Plan Summary: Daily contact with patient to assess and evaluate symptoms and progress in treatment and Medication management Supportive approach/coping skills Polysubstance Dependence; complete the  Detox/work a relapse prevention plan Depression/PTSD/continue the Paxil and the Minipress Insomnia; will use Ambien 10 mg HS PRN for the one night tonight Will facilitate admission to [facility] in the morning  Medical Decision Making:  Review of Psycho-Social Stressors (1), Review of Medication Regimen & Side Effects (2) and Review of New Medication or Change in Dosage (2)     Burk Hoctor A 12/08/2014, 4:14 PM
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
## 2014-12-08 NOTE — BHH Suicide Risk Assessment (Signed)
[facility] Discharge Suicide Risk Assessment   Demographic Factors:  Caucasian  Total Time spent with patient: 30 minutes  Musculoskeletal: Strength & Muscle Tone: within normal limits Gait & Station: normal Patient leans: normal  Psychiatric Specialty Exam: Physical Exam  Review of Systems  Constitutional: Negative.   HENT: Negative.   Eyes: Negative.   Respiratory: Negative.   Cardiovascular: Negative.   Gastrointestinal: Negative.   Genitourinary: Negative.   Musculoskeletal: Negative.   Skin: Negative.   Neurological: Negative.   Endo/Heme/Allergies: Negative.   Psychiatric/Behavioral: Positive for depression and substance abuse. The patient is nervous/anxious.     Blood pressure 106/73, pulse 66, temperature 98 F (36.7 C), temperature source Oral, resp. rate 18, height  (1.499 m), weight 91.627 kg (202 lb), last menstrual period 11/21/2014.Body mass index is 40.78 kg/(m^2).  General Appearance: Fairly Groomed  Patent attorney::  Fair  Speech:  Clear and Coherent409  Volume:  Increased  Mood:  Anxious and worried  Affect:  Appropriate  Thought Process:  Coherent and Goal Directed  Orientation:  Full (Time, Place, and Person)  Thought Content:  symptoms events worries concerns  Suicidal Thoughts:  No  Homicidal Thoughts:  No  Memory:  Immediate;   Fair Recent;   Fair Remote;   Fair  Judgement:  Fair  Insight:  Present  Psychomotor Activity:  Restlessness  Concentration:  Fair  Recall:  [employer] of Knowledge:Fair  Language: Fair  Akathisia:  No  Handed:  Right  AIMS (if indicated):     Assets:  Desire for Improvement  Sleep:  Number of Hours: 5.75  Cognition: WNL  ADL's:  Intact   Have you used any form of tobacco in the last 30 days? (Cigarettes, Smokeless Tobacco, Cigars, and/or Pipes): Yes  Has this patient used any form of tobacco in the last 30 days? (Cigarettes, Smokeless Tobacco, Cigars, and/or Pipes) Yes, A prescription for an FDA-approved tobacco  cessation medication was offered at discharge and the patient refused  Mental Status Per Nursing Assessment::   On Admission:  Suicidal ideation indicated by patient (verbally contracts for safety)  Current Mental Status by Physician: In full contact with reality. There are no active S/S of withdrawal. There are no active SI plans or intent. She is willing and motivated to pursue residential treatment at [facility]   Loss Factors: NA  Historical Factors: NA  Risk Reduction Factors:   wants to do better  Continued Clinical Symptoms:  Depression:   Comorbid alcohol abuse/dependence Alcohol/Substance Abuse/Dependencies  Cognitive Features That Contribute To Risk:  Closed-mindedness, Polarized thinking and Thought constriction (tunnel vision)    Suicide Risk:  Minimal: No identifiable suicidal ideation.  Patients presenting with no risk factors but with morbid ruminations; may be classified as minimal risk based on the severity of the depressive symptoms  Principal Problem: Major depressive disorder, recurrent episode, severe Discharge Diagnoses:  Patient Active Problem List   Diagnosis Date Noted  . UTI (urinary tract infection) [N39.0] 12/07/2014  . Alcohol dependence with alcohol-induced mood disorder [F10.24]   . Major depressive disorder, recurrent episode, severe [F33.2] 12/04/2014  . Opioid use disorder, moderate, dependence [F11.20] 12/04/2014  . Alcohol use disorder, severe, dependence [F10.20] 11/12/2014  . Alcohol abuse [F10.10] 09/10/2013  . Cocaine abuse [F14.10] 09/10/2013    Follow-up Information    Follow up with [facility] On 12/09/2014.   Why:  at 8:00am for your initial screening. Please wait at the [facility] at the [facility] on [address]. Please call  [facility] staff at the number below to let them know you arrived and where they can find you.    Contact information:   [address] [place], [place] [address] [phone]      Plan Of  Care/Follow-up recommendations:  Activity:  as tolerated Diet:  regular Follow up [facility] as above Is patient on multiple antipsychotic therapies at discharge:  No   Has Patient had three or more failed trials of antipsychotic monotherapy by history:  No  Recommended Plan for Multiple Antipsychotic Therapies: NA    Shaquile Lutze A 12/08/2014, 4:21 PM

## 2014-12-08 NOTE — BHH Group Notes (Signed)
Adult Psychoeducational Group Note  Date:  12/08/2014 Time:  0900am  Group Topic/Focus:  Goals Group:   The focus of this group is to help patients establish daily goals to achieve during treatment and discuss how the patient can incorporate goal setting into their daily lives to aide in recovery. Orientation:   The focus of this group is to educate the patient on the purpose and policies of crisis stabilization and provide a format to answer questions about their admission.  The group details unit policies and expectations of patients while admitted.  Participation Level:  Did Not Attend  Additional Comments:  Pt asleep in bed during group.  Alfonse Spruce 12/08/2014, 10:21 AM

## 2014-12-08 NOTE — Progress Notes (Signed)
D:  Per pt self inventory pt reports sleeping fair, appetite fair, energy level low, ability to pay attention poor, rates depression at a 15 out of 10, hopelessness at a 15 out of 10, anxiety at a 15 out of 10, endorses Passive SI constantly, verbal contract for safety, denies HI/AVH, goal today:  "Finding rehab but feeling really down", depressed and anxious during interaction.   A:  Emotional support provided, Encouraged pt to continue with treatment plan and attend all group activities, q15 min checks maintained for safety.  R:  Pt is not going to all groups, skipped out and stayed in bed today, pleasant with staff and other patients on the unit.
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
## 2014-12-09 NOTE — Progress Notes (Addendum)
Patient ID: Alyssa Kelley, female   DOB: [birth year], 44 y.o.   MRN: [id number] D: Patient appears calm and cooperative, denies SI/HI  at this time.   A: All personal items in locker returned to patient.  Patient  provided with discharge instructions and verbalized understanding.  R: Patient states she will comply with OP services and medications as prescribed. Patient escorted to lobby.

## 2014-12-09 NOTE — Plan of Care (Signed)
Problem: Diagnosis: Increased Risk For Suicide Attempt Goal: STG-Patient Will Comply With Medication Regime Outcome: Progressing Pt compliant with medication regime this evening     

## 2014-12-09 NOTE — Progress Notes (Signed)
The patient says that she "feels like shit" today. Says that she is really depressed. Patient says that upon discharge she is going to [facility] and then on to the [facility].

## 2014-12-09 NOTE — Progress Notes (Signed)
Patient ID: Alyssa Kelley, female   DOB: Oct 16, 1970, 44 y.o.   MRN: [id number] D: Patient mood and affect appeared anxious. Pt discharging tomorrow to [facility] in the morning. Pt stated she will comply with outpatient recommendations. Pt denies SI/HI/AVH and pain. Pt denies any needs or concerns.  Cooperative with assessment. No acute distressed noted at this time.  A: Met with pt 1:1. Medications administered as prescribed. Support and encouragement provider to attend groups and engage in milieu. Pt encouraged to discuss feelings and come to staff with any question or concerns.  R: Patient is safe and complaint with medications.

## 2015-04-22 ENCOUNTER — Emergency Department (HOSPITAL_COMMUNITY)
Admission: EM | Admit: 2015-04-22 | Discharge: 2015-04-22 | Disposition: A | Discharge status: Home / Self Care | Payer: Self-pay | Attending: Emergency Medicine | Admitting: Emergency Medicine

## 2015-04-22 ENCOUNTER — Encounter (HOSPITAL_COMMUNITY): Payer: Self-pay | Admitting: Emergency Medicine

## 2015-04-22 DIAGNOSIS — F329 Major depressive disorder, single episode, unspecified: Secondary | ICD-10-CM | POA: Insufficient documentation

## 2015-04-22 DIAGNOSIS — Z79899 Other long term (current) drug therapy: Secondary | ICD-10-CM | POA: Insufficient documentation

## 2015-04-22 DIAGNOSIS — Z792 Long term (current) use of antibiotics: Secondary | ICD-10-CM | POA: Insufficient documentation

## 2015-04-22 DIAGNOSIS — F419 Anxiety disorder, unspecified: Secondary | ICD-10-CM | POA: Insufficient documentation

## 2015-04-22 DIAGNOSIS — K0889 Other specified disorders of teeth and supporting structures: Secondary | ICD-10-CM | POA: Insufficient documentation

## 2015-04-22 DIAGNOSIS — Z720 Tobacco use: Secondary | ICD-10-CM | POA: Insufficient documentation

## 2015-04-22 DIAGNOSIS — I10 Essential (primary) hypertension: Secondary | ICD-10-CM | POA: Insufficient documentation

## 2015-04-22 DIAGNOSIS — Z8619 Personal history of other infectious and parasitic diseases: Secondary | ICD-10-CM | POA: Insufficient documentation

## 2015-04-22 MED ORDER — IBUPROFEN 800 MG PO TABS
800.0000 mg | ORAL_TABLET | Freq: Once | ORAL | Status: AC
  Administered 2015-04-22: 800 mg via ORAL
  Filled 2015-04-22: qty 1

## 2015-04-22 MED ORDER — PENICILLIN V POTASSIUM 500 MG PO TABS: 500.0000 mg | ORAL_TABLET | Freq: Four times a day (QID) | ORAL | Status: DC

## 2015-04-22 MED ORDER — PENICILLIN V POTASSIUM 500 MG PO TABS
250.0000 mg | ORAL_TABLET | Freq: Once | ORAL | Status: AC
  Administered 2015-04-22: 250 mg via ORAL
  Filled 2015-04-22: qty 1

## 2015-04-22 MED ORDER — IBUPROFEN 800 MG PO TABS: 800.0000 mg | ORAL_TABLET | Freq: Three times a day (TID) | ORAL | Status: DC

## 2015-04-22 NOTE — ED Notes (Signed)
Patient states that she has pain and swelling to almost all her teeth

## 2015-04-22 NOTE — ED Notes (Signed)
Went in to d/c the patient and she wants a rx for motrin as well as the [facility]. She also would like a dose of med's here prior to leaving. Pa aware. Orders carries out

## 2015-04-22 NOTE — Discharge Instructions (Signed)

## 2015-04-22 NOTE — ED Provider Notes (Signed)
CSN: [id number]     Arrival date & time 04/22/15  1615 History  By signing my name below, I, Alyssa Kelley, attest that this documentation has been prepared under the direction and in the presence of Roxy Horsemanobert Eldo Umanzor, PA-C. Electronically Signed: Elon SpannerGarrett Kelley ED Scribe. 04/22/2015. 4:29 PM.    No chief complaint on file.  The history is provided by the patient. No language interpreter was used.   HPI Comments: Alyssa Kelley is a 44 y.o. female who presents to the Emergency Department complaining of chronic, constant, moderate generalized dental pain that has been gradually worsening for several years; no treatments tried.  Patient reports [facility] and comes to the ED seeking a referral for dental care.    Past Medical History  Diagnosis Date  . Abscess of mouth   . Heroin abuse   . Anxiety   . Depression   . Hypertension   . Hepatitis C    Past Surgical History  Procedure Laterality Date  . Cesarean section     No family history on file. Social History  Substance Use Topics  . Smoking status: Current Every Day Smoker -- 1.00 packs/day for 15 years    Types: Cigarettes  . Smokeless tobacco: Not on file  . Alcohol Use: 50.4 oz/week    84 Cans of beer per week     Comment: one 12 pack qd   OB History    No data available     Review of Systems  HENT: Positive for dental problem.       Allergies  Review of patient's allergies indicates no known allergies.  Home Medications   Prior to Admission medications   Medication Sig Start Date End Date Taking? Authorizing Provider  gabapentin (NEURONTIN) 300 MG capsule Take 1 capsule (300 mg total) by mouth 2 (two) times daily. 12/08/14   Rachael FeeIrving A Lugo, MD  hydrOXYzine (ATARAX/VISTARIL) 25 MG tablet Take 1 tablet (25 mg total) by mouth 3 (three) times daily. 12/08/14   Rachael FeeIrving A Lugo, MD  mirtazapine (REMERON) 7.5 MG tablet Take 1 tablet (7.5 mg total) by mouth at bedtime. 12/08/14   Rachael FeeIrving A Lugo, MD   nitrofurantoin, macrocrystal-monohydrate, (MACROBID) 100 MG capsule Take 1 capsule (100 mg total) by mouth every 12 (twelve) hours. 12/08/14   Rachael FeeIrving A Lugo, MD  PARoxetine (PAXIL) 20 MG tablet Take 1 tablet (20 mg total) by mouth daily. 12/08/14   Rachael FeeIrving A Lugo, MD  potassium chloride SA (K-DUR,KLOR-CON) 20 MEQ tablet Take 1 tablet (20 mEq total) by mouth daily. 12/08/14   Rachael FeeIrving A Lugo, MD  prazosin (MINIPRESS) 1 MG capsule Take 1 capsule (1 mg total) by mouth at bedtime. 12/08/14   Rachael FeeIrving A Lugo, MD  propranolol (INDERAL) 10 MG tablet Take 1 tablet (10 mg total) by mouth 3 (three) times daily. For anxiety 12/08/14   Rachael FeeIrving A Lugo, MD  traZODone (DESYREL) 100 MG tablet Take 1 tablet (100 mg total) by mouth at bedtime and may repeat dose one time if needed. 12/08/14   Rachael FeeIrving A Lugo, MD   There were no vitals taken for this visit. Physical Exam  Constitutional: She is oriented to person, place, and time. She appears well-developed and well-nourished. No distress.  HENT:  Head: Normocephalic and atraumatic.  Poor dentition throughout.  No signs of peritonsillar or tonsillar abscess.  No signs of gingival abscess. Oropharynx is clear and without exudates.  Uvula is midline.  Airway is intact. No signs  of Ludwig's angina with palpation of oral and sublingual mucosa.   Eyes: Conjunctivae and EOM are normal.  Neck: Normal range of motion. Neck supple. No tracheal deviation present.  Cardiovascular: Normal rate.   Pulmonary/Chest: Effort normal. No respiratory distress.  Abdominal: She exhibits no distension.  Musculoskeletal: Normal range of motion.  Neurological: She is alert and oriented to person, place, and time.  Skin: Skin is warm and dry.  Psychiatric: She has a normal mood and affect. Her behavior is normal. Judgment and thought content normal.  Nursing note and vitals reviewed.   ED Course  Procedures (including critical care time)   COORDINATION OF CARE:  4:28 PM Will provide dental  referral and prescribe antibiotic.  Patient may use ibuprofen for pain.  Patient acknowledges and agrees with plan.      MDM   Final diagnoses:  Pain, dental    Patient with toothache.  No gross abscess.  Exam unconcerning for Ludwig's angina or spread of infection.  Will treat with penicillin and OTC pain medicine.  Urged patient to follow-up with dentist.    I personally performed the services described in this documentation, which was scribed in my presence. The recorded information has been reviewed and is accurate.      Roxy Horseman, PA-C 04/22/15 1634  Vanetta Mulders, MD 04/25/15 [phone]

## 2015-11-30 ENCOUNTER — Encounter (HOSPITAL_COMMUNITY): Payer: Self-pay

## 2015-11-30 ENCOUNTER — Emergency Department (HOSPITAL_COMMUNITY)
Admission: EM | Admit: 2015-11-30 | Discharge: 2015-11-30 | Disposition: A | Discharge status: Home / Self Care | Payer: Self-pay | Attending: Emergency Medicine | Admitting: Emergency Medicine

## 2015-11-30 DIAGNOSIS — Z79899 Other long term (current) drug therapy: Secondary | ICD-10-CM | POA: Insufficient documentation

## 2015-11-30 DIAGNOSIS — F17210 Nicotine dependence, cigarettes, uncomplicated: Secondary | ICD-10-CM | POA: Insufficient documentation

## 2015-11-30 DIAGNOSIS — I10 Essential (primary) hypertension: Secondary | ICD-10-CM | POA: Insufficient documentation

## 2015-11-30 DIAGNOSIS — N390 Urinary tract infection, site not specified: Secondary | ICD-10-CM | POA: Insufficient documentation

## 2015-11-30 LAB — URINE MICROSCOPIC-ADD ON

## 2015-11-30 LAB — POC URINE PREG, ED: Preg Test, Ur: NEGATIVE

## 2015-11-30 LAB — URINALYSIS, ROUTINE W REFLEX MICROSCOPIC
Glucose, UA: NEGATIVE mg/dL
Ketones, ur: 15 mg/dL — AB
Nitrite: POSITIVE — AB
Protein, ur: 100 mg/dL — AB
Specific Gravity, Urine: 1.023 (ref 1.005–1.030)
pH: 6 (ref 5.0–8.0)

## 2015-11-30 MED ORDER — IBUPROFEN 400 MG PO TABS
400.0000 mg | ORAL_TABLET | Freq: Once | ORAL | Status: AC
  Administered 2015-11-30: 400 mg via ORAL
  Filled 2015-11-30: qty 1

## 2015-11-30 MED ORDER — OXYCODONE-ACETAMINOPHEN 5-325 MG PO TABS
1.0000 | ORAL_TABLET | Freq: Once | ORAL | Status: AC
  Administered 2015-11-30: 1 via ORAL
  Filled 2015-11-30: qty 1

## 2015-11-30 MED ORDER — SULFAMETHOXAZOLE-TRIMETHOPRIM 800-160 MG PO TABS
1.0000 | ORAL_TABLET | Freq: Once | ORAL | Status: AC
Start: 2015-11-30 — End: 2015-11-30
  Administered 2015-11-30: 1 via ORAL
  Filled 2015-11-30: qty 1

## 2015-11-30 MED ORDER — SULFAMETHOXAZOLE-TRIMETHOPRIM 800-160 MG PO TABS: 1.0000 | ORAL_TABLET | Freq: Two times a day (BID) | ORAL | Status: AC

## 2015-11-30 NOTE — Discharge Instructions (Signed)

## 2015-11-30 NOTE — ED Notes (Signed)
patien complains of dysuria and frequency x 1 day. Hx of uti's.

## 2015-11-30 NOTE — ED Provider Notes (Signed)
CSN: [id number]     Arrival date & time 11/30/15  4098 History   First MD Initiated Contact with Patient 11/30/15 0840     Chief Complaint  Patient presents with  . Dysuria     (Consider location/radiation/quality/duration/timing/severity/associated sxs/prior Treatment) HPI   45 year old female presenting with what she thinks is a urinary tract infection. Symptom onset about a day ago. Symptoms include dysuria, urinary frequency and some mild lower abdominal pain. She's had urinary tract infections previously and feels that current symptoms feel similar. No fevers or chills. No nausea or vomiting. No unusual vaginal bleeding or discharge. No back pain. Has tried taking Azo with animprovement.  Past Medical History  Diagnosis Date  . Abscess of mouth   . Heroin abuse   . Anxiety   . Depression   . Hypertension   . Hepatitis C    Past Surgical History  Procedure Laterality Date  . Cesarean section     No family history on file. Social History  Substance Use Topics  . Smoking status: Current Every Day Smoker -- 1.00 packs/day for 15 years    Types: Cigarettes  . Smokeless tobacco: None  . Alcohol Use: 50.4 oz/week    84 Cans of beer per week     Comment: one 12 pack qd   OB History    No data available     Review of Systems  All systems reviewed and negative, other than as noted in HPI.   Allergies  Review of patient's allergies indicates no known allergies.  Home Medications   Prior to Admission medications   Medication Sig Start Date End Date Taking? Authorizing Provider  gabapentin (NEURONTIN) 300 MG capsule Take 1 capsule (300 mg total) by mouth 2 (two) times daily. 12/08/14   Rachael Fee, MD  hydrOXYzine (ATARAX/VISTARIL) 25 MG tablet Take 1 tablet (25 mg total) by mouth 3 (three) times daily. 12/08/14   Rachael Fee, MD  ibuprofen (ADVIL,MOTRIN) 800 MG tablet Take 1 tablet (800 mg total) by mouth 3 (three) times daily. 04/22/15   Roxy Horseman, PA-C   mirtazapine (REMERON) 7.5 MG tablet Take 1 tablet (7.5 mg total) by mouth at bedtime. 12/08/14   Rachael Fee, MD  nitrofurantoin, macrocrystal-monohydrate, (MACROBID) 100 MG capsule Take 1 capsule (100 mg total) by mouth every 12 (twelve) hours. 12/08/14   Rachael Fee, MD  PARoxetine (PAXIL) 20 MG tablet Take 1 tablet (20 mg total) by mouth daily. 12/08/14   Rachael Fee, MD  penicillin v potassium (VEETID) 500 MG tablet Take 1 tablet (500 mg total) by mouth 4 (four) times daily. 04/22/15   Roxy Horseman, PA-C  potassium chloride SA (K-DUR,KLOR-CON) 20 MEQ tablet Take 1 tablet (20 mEq total) by mouth daily. 12/08/14   Rachael Fee, MD  prazosin (MINIPRESS) 1 MG capsule Take 1 capsule (1 mg total) by mouth at bedtime. 12/08/14   Rachael Fee, MD  propranolol (INDERAL) 10 MG tablet Take 1 tablet (10 mg total) by mouth 3 (three) times daily. For anxiety 12/08/14   Rachael Fee, MD  traZODone (DESYREL) 100 MG tablet Take 1 tablet (100 mg total) by mouth at bedtime and may repeat dose one time if needed. 12/08/14   Rachael Fee, MD   BP 119/74 mmHg  Pulse 94  Temp(Src) 98.4 F (36.9 C)  Resp 18  Ht  (1.549 m)  Wt 200 lb (90.719 kg)  BMI 37.81 kg/m2  SpO2 95%  LMP  11/23/2015 Physical Exam  Constitutional: She appears well-developed and well-nourished. No distress.  HENT:  Head: Normocephalic and atraumatic.  Eyes: Conjunctivae are normal. Right eye exhibits no discharge. Left eye exhibits no discharge.  Neck: Neck supple.  Cardiovascular: Normal rate, regular rhythm and normal heart sounds.  Exam reveals no gallop and no friction rub.   No murmur heard. Pulmonary/Chest: Effort normal and breath sounds normal. No respiratory distress.  Abdominal: Soft. She exhibits no distension. There is no tenderness.  Benign abdomen  Genitourinary:  No CVA tenderness  Musculoskeletal: She exhibits no edema or tenderness.  Neurological: She is alert.  Skin: Skin is warm and dry.  Psychiatric:  She has a normal mood and affect. Her behavior is normal. Thought content normal.  Nursing note and vitals reviewed.   ED Course  Procedures (including critical care time) Labs Review Labs Reviewed  URINALYSIS, ROUTINE W REFLEX MICROSCOPIC (NOT AT [facility]) - Abnormal; Notable for the following:    Color, Urine AMBER (*)    APPearance TURBID (*)    Hgb urine dipstick LARGE (*)    Bilirubin Urine MODERATE (*)    Ketones, ur 15 (*)    Protein, ur 100 (*)    Nitrite POSITIVE (*)    Leukocytes, UA LARGE (*)    All other components within normal limits  URINE MICROSCOPIC-ADD ON - Abnormal; Notable for the following:    Squamous Epithelial / LPF 6-30 (*)    Bacteria, UA FEW (*)    All other components within normal limits  URINE CULTURE  POC URINE PREG, ED    Imaging Review No results found. I have personally reviewed and evaluated these images and lab results as part of my medical decision-making.   EKG Interpretation None      MDM   Final diagnoses:  UTI (lower urinary tract infection)    45 year old female with symptoms and urinalysis consistent with urinary tract infection. She is afebrile. Nontoxic. I feel she is appropriate for outpatient treatment. Will send urine culture. Course of Bactrim. Return precautions discussed. Outpatient follow-up as needed otherwise.    Raeford RazorStephen Bartow Zylstra, MD 12/10/15 2152

## 2015-12-02 IMAGING — CT CT ABD-PELV W/ CM
1 of 3 series · 14 of 32 positions shown, 19 images · IV contrast (omnipaque)
Comparison: Abdominal ultrasound 11/18/2004.

CLINICAL DATA: 43-year-old female with right lower quadrant pain.
Initial encounter.

EXAM:
CT ABDOMEN AND PELVIS WITH CONTRAST
TECHNIQUE: Multidetector CT imaging of the abdomen and pelvis was performed
using the standard protocol following bolus administration of
intravenous contrast.
CONTRAST:  50mL OMNIPAQUE IOHEXOL 300 MG/ML SOLN, 100mL OMNIPAQUE
IOHEXOL 300 MG/ML SOLN

[Series 2: abd/pel with · axial · 0.71mm/px · z∈[-486,-101]mm · 14 of 87 slices shown, 19 images]
[im 5/87  soft-tissue]
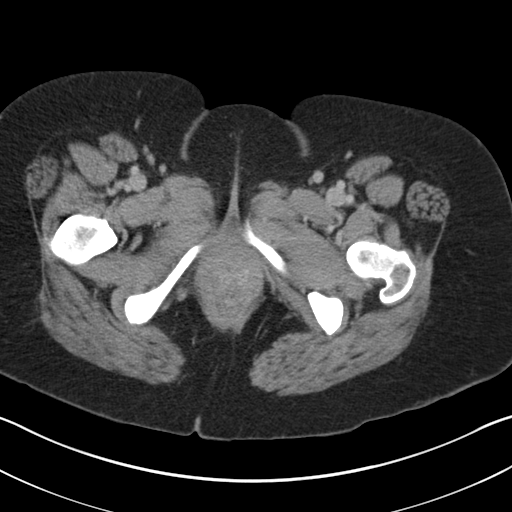
[im 5/87  bone]
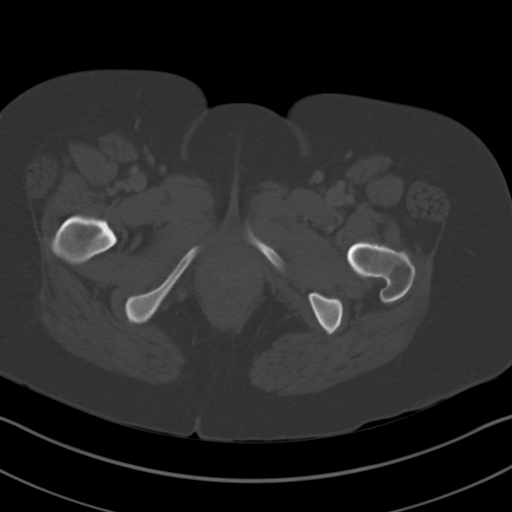
[im 10/87  soft-tissue]
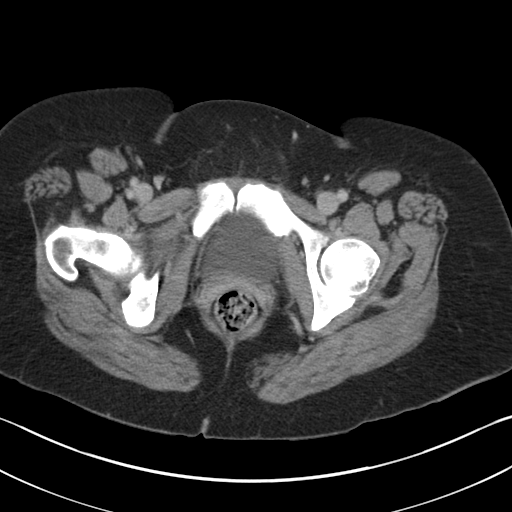
[im 20/87  soft-tissue]
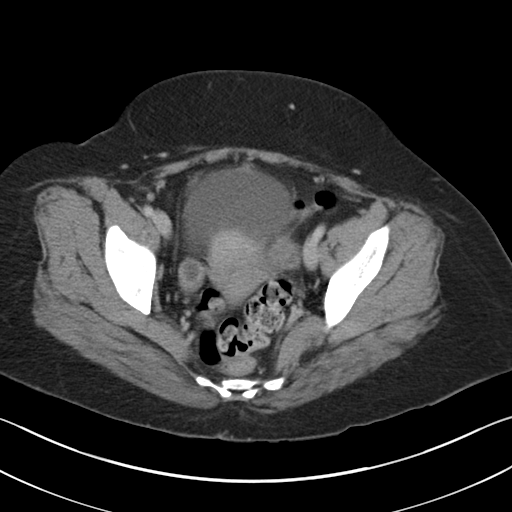
[im 24/87  soft-tissue]
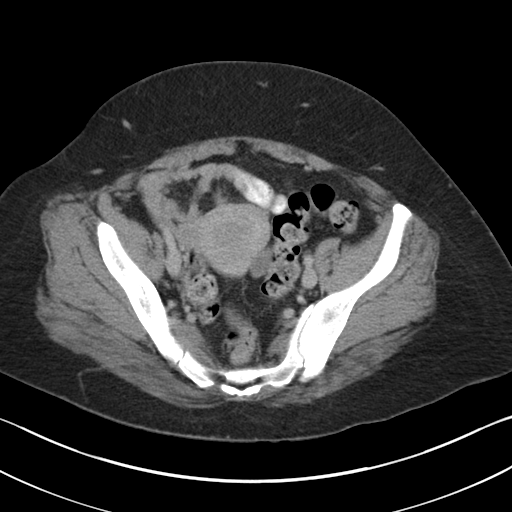
[im 29/87  soft-tissue]
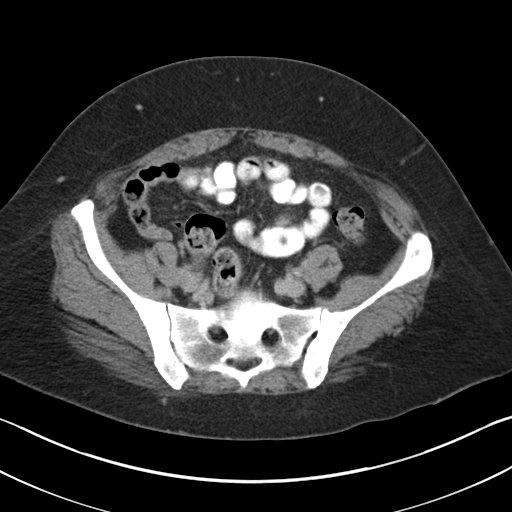
[im 39/87  soft-tissue]
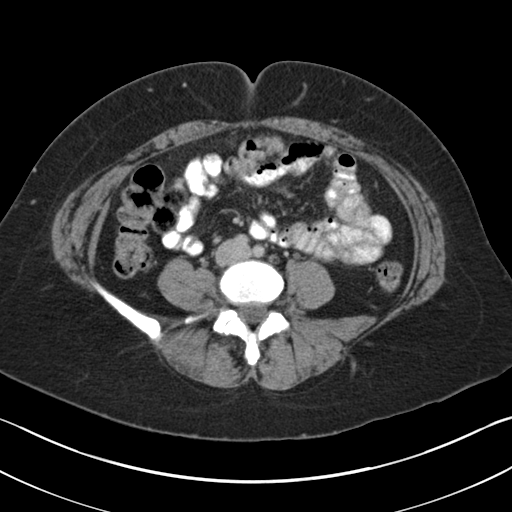
[im 44/87  soft-tissue]
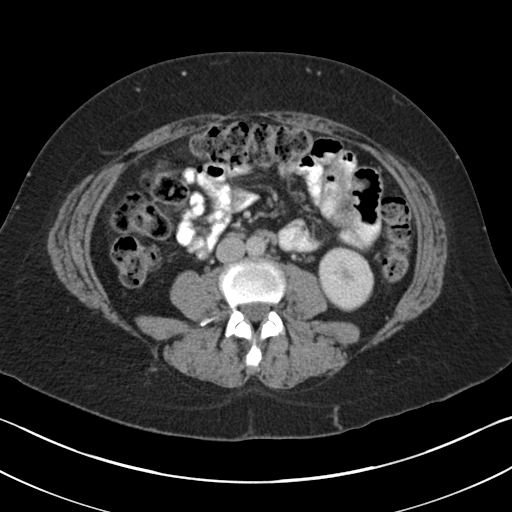
[im 48/87  soft-tissue]
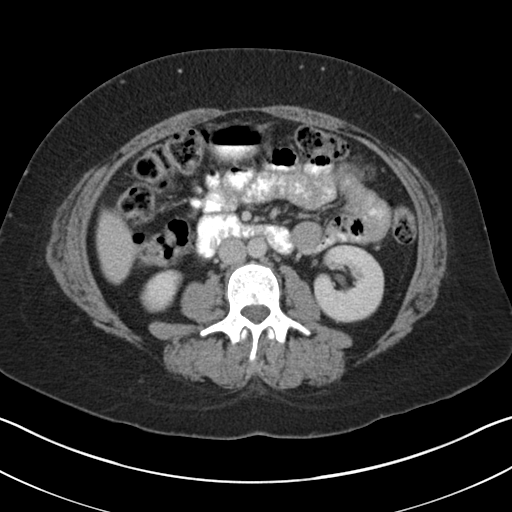
[im 58/87  soft-tissue]
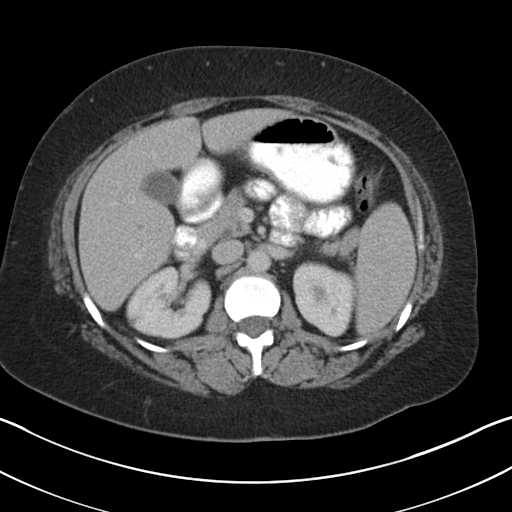
[im 58/87  bone]
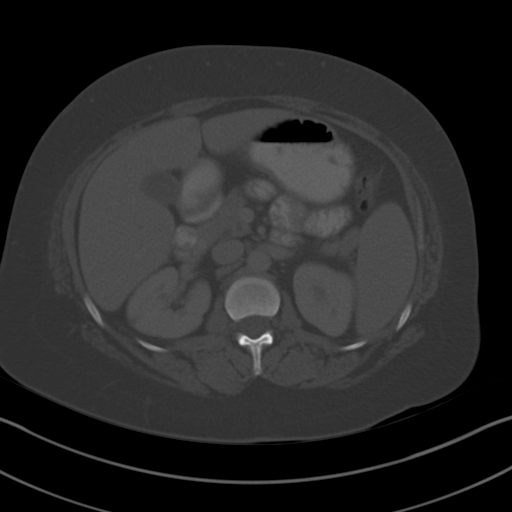
[im 63/87  soft-tissue]
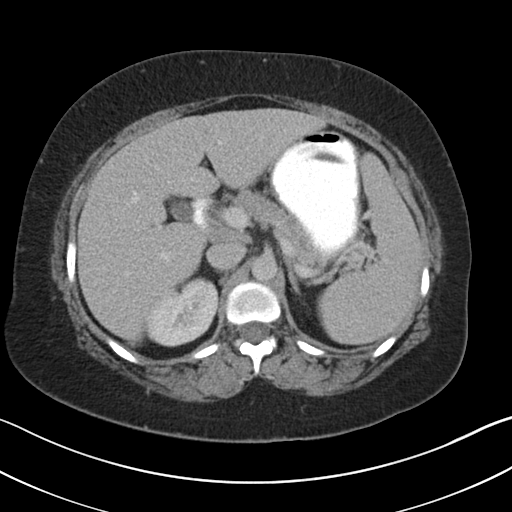
[im 67/87  soft-tissue]
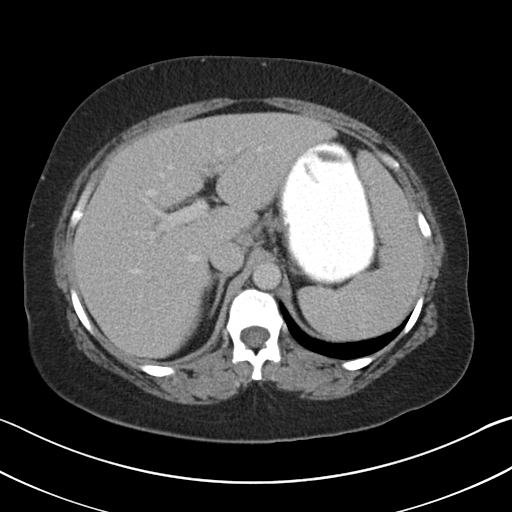
[im 67/87  lung]
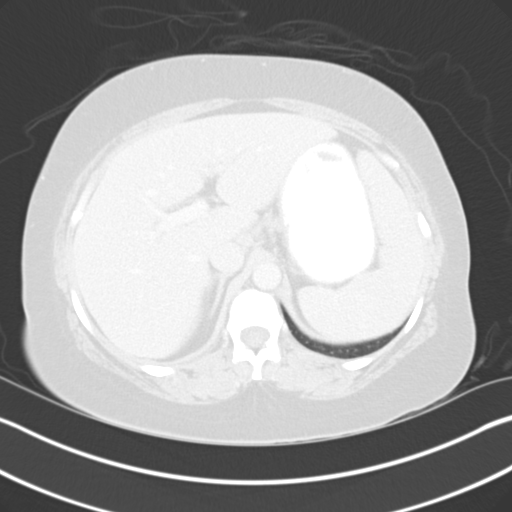
[im 72/87  lung]
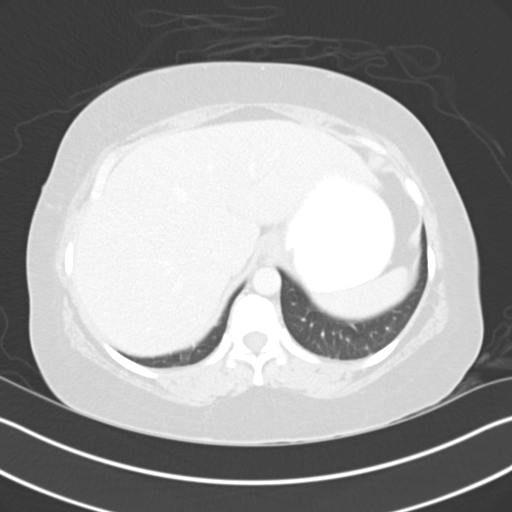
[im 77/87  soft-tissue]
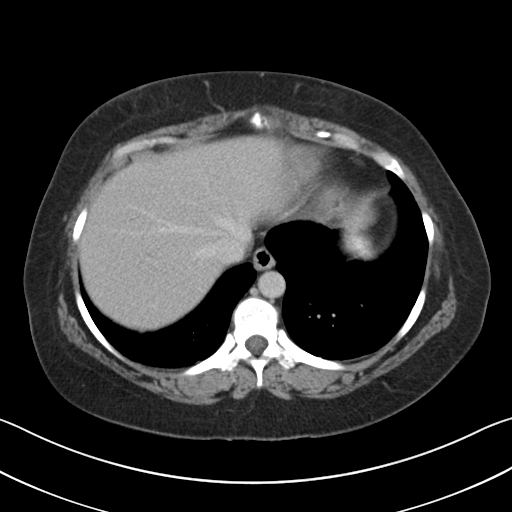
[im 77/87  lung]
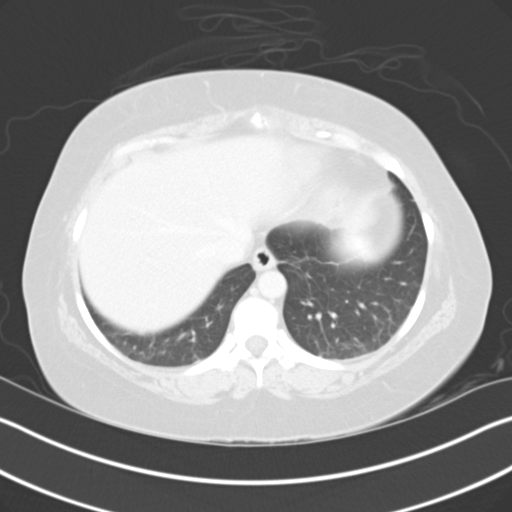
[im 82/87  soft-tissue]
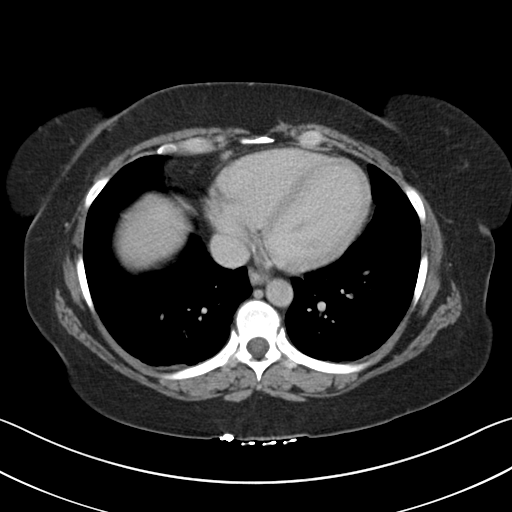
[im 82/87  lung]
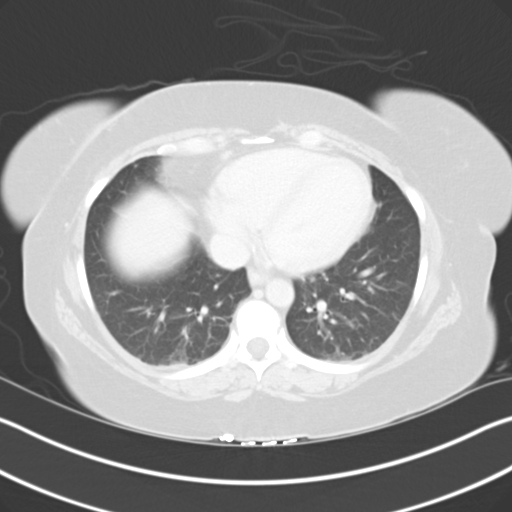

[14 of 32 positions shown; findings below may reference images not displayed]

FINDINGS: Mild dependent atelectasis.  No pericardial or pleural effusion.

No acute osseous abnormality identified.

No pelvic free fluid.  Negative rectum.  Negative uterus and adnexa.

Redundant sigmoid colon, otherwise negative. Stool throughout the
colon which is nondilated. Other colon segments including the cecum
and appendix are normal. Oral contrast in small bowel loops but has
not yet reached the distal small bowel. No dilated or inflamed small
bowel. Negative stomach and duodenum.

Mild fatty infiltration of the liver along the falciform ligament.
The liver otherwise appears normal. Negative gallbladder, spleen,
pancreas, adrenal glands, and portal venous system. Major arterial
structures in the abdomen and pelvis are patent. No abdominal free
fluid.

Mild right renal midpole scarring. Otherwise negative kidneys. No
nephrolithiasis or perinephric stranding. Normal renal contrast
excretion on delayed images. No lymphadenopathy identified.
Unremarkable bladder.
IMPRESSION: Normal appendix and no acute or inflammatory findings identified in
the abdomen or pelvis.

## 2015-12-03 LAB — URINE CULTURE: Culture: >=100000 — AB

## 2016-01-06 DIAGNOSIS — Z5900 Homelessness unspecified: Secondary | ICD-10-CM

## 2016-01-09 LAB — GLUCOSE, POCT (MANUAL RESULT ENTRY): POC Glucose: 110 mg/dl — AB (ref 70–99)

## 2016-01-09 NOTE — Congregational Nurse Program (Signed)
Congregational Nurse Program Note  Date of Encounter: 01/06/2016  Past Medical History: Past Medical History:  Diagnosis Date  . Abscess of mouth   . Anxiety   . Depression   . Hepatitis C   . Heroin abuse   . Hypertension     Encounter Details:     CNP Questionnaire - 01/06/16 1431      Patient Demographics   Is this a new or existing patient? New   Patient is considered a/an Not Applicable   Race Caucasian/White     Patient Assistance   Location of Patient Assistance Not Applicable   Patient's financial/insurance status Low Income;[place] [facility]/Care Connects   Uninsured Patient Yes   Interventions Not Applicable   Patient referred to apply for the following financial assistance Not Applicable   Food insecurities addressed Provided food supplies   Transportation assistance No   Assistance securing medications No   Educational health offerings Nutrition;Chronic disease;Diabetes;Navigating the healthcare system     Encounter Details   Primary purpose of visit Education/Health Concerns   Was an Emergency Department visit averted? Not Applicable   Does patient have a medical provider? Yes   Patient referred to Not Applicable   Was a mental health screening completed? (GAINS tool) No   Does patient have dental issues? No   Does patient have vision issues? No   Does your patient have an abnormal blood pressure today? No   Since previous encounter, have you referred patient for abnormal blood pressure that resulted in a new diagnosis or medication change? No   Does your patient have an abnormal blood glucose today? No   Since previous encounter, have you referred patient for abnormal blood glucose that resulted in a new diagnosis or medication change? No   Was there a life-saving intervention made? No      Requested screening for B/P and CBG.  Denies history of hypertension or diabetes.  Both readings within normal limits

## 2016-01-11 DIAGNOSIS — Z5900 Homelessness unspecified: Secondary | ICD-10-CM

## 2016-01-12 LAB — GLUCOSE, POCT (MANUAL RESULT ENTRY): POC GLUCOSE: 127 mg/dL — AB (ref 70–99)

## 2016-01-12 NOTE — Congregational Nurse Program (Signed)
Congregational Nurse Program Note  Date of Encounter: 01/11/2016  Past Medical History: Past Medical History:  Diagnosis Date  . Abscess of mouth   . Anxiety   . Depression   . Hepatitis C   . Heroin abuse   . Hypertension     Encounter Details:     CNP Questionnaire - 01/06/16 1431      Patient Demographics   Is this a new or existing patient? New   Patient is considered a/an Not Applicable   Race Caucasian/White     Patient Assistance   Location of Patient Assistance Not Applicable   Patient's financial/insurance status Low Income;[place] Card/Care Connects   Uninsured Patient Yes   Interventions Not Applicable   Patient referred to apply for the following financial assistance Not Applicable   Food insecurities addressed Provided food supplies   Transportation assistance No   Assistance securing medications No   Educational health offerings Nutrition;Chronic disease;Diabetes;Navigating the healthcare system     Encounter Details   Primary purpose of visit Education/Health Concerns   Was an Emergency Department visit averted? Not Applicable   Does patient have a medical provider? Yes   Patient referred to Not Applicable   Was a mental health screening completed? (GAINS tool) No   Does patient have dental issues? No   Does patient have vision issues? No   Does your patient have an abnormal blood pressure today? No   Since previous encounter, have you referred patient for abnormal blood pressure that resulted in a new diagnosis or medication change? No   Does your patient have an abnormal blood glucose today? No   Since previous encounter, have you referred patient for abnormal blood glucose that resulted in a new diagnosis or medication change? No   Was there a life-saving intervention made? No      B/P and CBG check.  Wanted additional referral to dentist.  States Adult Dental will not see her because she has missed three appointments.  Gave her information about  [facility] dental [facility]

## 2016-01-30 ENCOUNTER — Encounter (HOSPITAL_COMMUNITY): Payer: Self-pay | Admitting: Emergency Medicine

## 2016-01-30 ENCOUNTER — Emergency Department (HOSPITAL_COMMUNITY)
Admission: EM | Admit: 2016-01-30 | Discharge: 2016-01-30 | Disposition: A | Discharge status: Home / Self Care | Payer: Self-pay | Attending: Emergency Medicine | Admitting: Emergency Medicine

## 2016-01-30 ENCOUNTER — Emergency Department (HOSPITAL_COMMUNITY): Payer: Self-pay

## 2016-01-30 DIAGNOSIS — I10 Essential (primary) hypertension: Secondary | ICD-10-CM | POA: Insufficient documentation

## 2016-01-30 DIAGNOSIS — F17210 Nicotine dependence, cigarettes, uncomplicated: Secondary | ICD-10-CM | POA: Insufficient documentation

## 2016-01-30 DIAGNOSIS — R0600 Dyspnea, unspecified: Secondary | ICD-10-CM | POA: Insufficient documentation

## 2016-01-30 DIAGNOSIS — R0981 Nasal congestion: Secondary | ICD-10-CM | POA: Insufficient documentation

## 2016-01-30 LAB — I-STAT CHEM 8, ED
BUN: 6 mg/dL (ref 6–20)
CHLORIDE: 98 mmol/L — AB (ref 101–111)
CREATININE: 0.8 mg/dL (ref 0.44–1.00)
Calcium, Ion: 1.17 mmol/L (ref 1.13–1.30)
Glucose, Bld: 104 mg/dL — ABNORMAL HIGH (ref 65–99)
HEMATOCRIT: 39 % (ref 36.0–46.0)
Hemoglobin: 13.3 g/dL (ref 12.0–15.0)
Potassium: 3.9 mmol/L (ref 3.5–5.1)
SODIUM: 139 mmol/L (ref 135–145)
TCO2: 28 mmol/L (ref 0–100)

## 2016-01-30 MED ORDER — ALBUTEROL SULFATE (2.5 MG/3ML) 0.083% IN NEBU
5.0000 mg | INHALATION_SOLUTION | Freq: Once | RESPIRATORY_TRACT | Status: AC
  Administered 2016-01-30: 5 mg via RESPIRATORY_TRACT
  Filled 2016-01-30: qty 6

## 2016-01-30 MED ORDER — PREDNISONE 10 MG PO TABS: 40.0000 mg | ORAL_TABLET | Freq: Every day | ORAL | 0 refills | Status: AC

## 2016-01-30 MED ORDER — IBUPROFEN 800 MG PO TABS
800.0000 mg | ORAL_TABLET | Freq: Once | ORAL | Status: AC
  Administered 2016-01-30: 800 mg via ORAL
  Filled 2016-01-30: qty 1

## 2016-01-30 MED ORDER — PREDNISONE 20 MG PO TABS
60.0000 mg | ORAL_TABLET | Freq: Once | ORAL | Status: AC
  Administered 2016-01-30: 60 mg via ORAL
  Filled 2016-01-30: qty 3

## 2016-01-30 MED ORDER — ALBUTEROL SULFATE HFA 108 (90 BASE) MCG/ACT IN AERS
2.0000 | INHALATION_SPRAY | RESPIRATORY_TRACT | Status: DC | PRN
  Administered 2016-01-30: 2 via RESPIRATORY_TRACT
  Filled 2016-01-30: qty 6.7

## 2016-01-30 MED ORDER — IPRATROPIUM-ALBUTEROL 0.5-2.5 (3) MG/3ML IN SOLN
3.0000 mL | Freq: Once | RESPIRATORY_TRACT | Status: AC
  Administered 2016-01-30: 3 mL via RESPIRATORY_TRACT
  Filled 2016-01-30: qty 3

## 2016-01-30 MED ORDER — ALBUTEROL SULFATE HFA 108 (90 BASE) MCG/ACT IN AERS: 2.0000 | INHALATION_SPRAY | RESPIRATORY_TRACT | 1 refills | Status: DC | PRN

## 2016-01-30 NOTE — ED Notes (Signed)
Pt given coca cola.

## 2016-01-30 NOTE — Discharge Instructions (Signed)
RESOURCE GUIDE ° °Chronic Pain Problems: °Contact [facility]  [phone] °Patients need to be referred by their primary care doctor. ° °Insufficient Money for Medicine: °Contact [employer]:  call [phone] 892-1162 ° °No Primary Care Doctor: °Call [facility]  [phone] - can help you locate a primary care doctor that  accepts your insurance, provides certain services, etc. °Physician Referral Service- [phone] ° °Agencies that provide inexpensive medical care: °[facility]  [phone] °[facility]  [phone] °[facility]  [phone] °[facility]  [phone] °[facility]  [phone] °[facility]  [phone] ° °Medicaid-accepting [place] Providers: °[facility]- [address] ° [phone], Mon-Fri 9am-7pm, Sat 9am-1pm °[facility]- [address], [address] ° [phone] °[facility]- [address], [address] ° [phone] °[facility]- [address] [place] [address] ° [phone] °[facility]- [address], [address], [phone] ° Only accepts [place] Access Medicaid patients after they have their name  applied to their card ° °Self Pay (no insurance) in [place]: °Sickle Cell Patients - [facility] ° [address], [phone] °[facility]- [address] ° [phone] °      -     [facility]- [address] °      -     [facility]- see information above (Speak to Pam H if you do not have insurance) °      -  [facility] [place]- [address],  [phone] °      -  [facility]- [address] [place] [address], [phone] °      -  Dr Osei-Bonsu-  [address], [place], [phone] °      -  [facility] - [address], [phone] °      -  [facility]- [address] [place] [address], [phone], also [address] °  [address],  [phone] °      -     [facility]- [address], [phone], 1st & 3rd Saturday °        every month, 10am-1pm ° -     [facility] °  [address], [place]. °  Phone:  [phone], Fax:  [phone]. Hours of Operation:  9 am - 6 pm, M-F. ° -     [facility] °  [address], [address], [place] °  Phone: [phone], Fax: [phone]. Hours of Operation:  8:30 am - 5:30 pm, M-F. ° ° ° °Dental Assistance °If unable to pay or uninsured, contact:  [place] [facility] to become qualified for the adult dental clinic. ° °Patients with Medicaid: [facility] °[address], [phone] °[address], [phone] ° °If unable to pay, or uninsured, contact [place] [facility] ([phone] in [place], [phone] in [place]) to become qualified for the adult dental clinic ° °[facility] °[address] °[place], [place] [address] °[phone] °[email or web address] ° °Other Low-Cost Community Dental Services: °[facility]- [address], [place], [place], [address], [phone], Ext. [phone], 2nd and 4th Thursday of the month at 6:30am.  10 clients each day by appointment, can sometimes see walk-in patients if someone does not show for an appointment. °  [facility]- [address], [place], [place], [address], [phone] °[facility]- [address], [place], [place], [address], [phone] °[facility]- [phone] °[facility]- [phone] °[facility]- [phone]  °

## 2016-01-30 NOTE — ED Provider Notes (Signed)
Emergency Department Provider Note   I have reviewed the triage vital signs and the nursing notes.   HISTORY  Chief Complaint Shortness of Breath and Cough   HPI Alyssa Kelley is a 45 y.o. female with PMH of anxiety, depression, and HTN presents to emergency department for evaluation of difficulty breathing, lower chin a swelling, nasal congestion, body aches. The patient denies any associated fever. She is not having chest pain. She reports symptom onset yesterday has been gradually worsening. No exacerbating or alleviating factors. Her muscle soreness is mild and constant. She denies any associated nausea, vomiting, diarrhea. No abdominal discomfort. No known sick contacts. The patient does live in a shelter and denies any hemoptysis. No known exposure to tuberculosis or past history of treated TB.    Past Medical History:  Diagnosis Date  . Abscess of mouth   . Anxiety   . Depression   . Hepatitis C   . Heroin abuse   . Hypertension     Patient Active Problem List   Diagnosis Date Noted  . UTI (urinary tract infection) 12/07/2014  . Alcohol dependence with alcohol-induced mood disorder (HCC)   . Major depressive disorder, recurrent episode, severe (HCC) 12/04/2014  . Opioid use disorder, moderate, dependence (HCC) 12/04/2014  . Alcohol use disorder, severe, dependence (HCC) 11/12/2014  . Alcohol abuse 09/10/2013  . Cocaine abuse 09/10/2013    Past Surgical History:  Procedure Laterality Date  . CESAREAN SECTION      Current Outpatient Rx  . Order #: [id number] Class: Print  . Order #: [id number] Class: Print  . Order #: [id number] Class: Print  . Order #: [id number] Class: Print  . Order #: [id number] Class: Print  . Order #: [id number] Class: Print  . Order #: [id number] Class: Print  . Order #: [id number] Class: Print  . [START ON 01/31/2016] Order #: [id number] Class: Print    Allergies Review of patient's allergies indicates no known allergies.  No family history on  file.  Social History Social History  Substance Use Topics  . Smoking status: Current Every Day Smoker    Packs/day: 1.00    Years: 15.00    Types: Cigarettes  . Smokeless tobacco: Never Used  . Alcohol use 50.4 oz/week    84 Cans of beer per week     Comment: one 12 pack qd    Review of Systems  Constitutional: No fever/chills Eyes: No visual changes. ENT: No sore throat. Cardiovascular: Denies chest pain. Respiratory: Positive shortness of breath. Gastrointestinal: No abdominal pain. No nausea, no vomiting. No diarrhea. No constipation. Genitourinary: Negative for dysuria. Musculoskeletal: Negative for back pain. Skin: Negative for rash. Neurological: Negative for headaches, focal weakness or numbness.  10-point ROS otherwise negative.  ____________________________________________   PHYSICAL EXAM:  VITAL SIGNS: ED Triage Vitals [01/30/16 1411]  Enc Vitals Group     BP 100/71     Pulse Rate 67     Resp 20     Temp 98.7 F (37.1 C)     Temp Source Oral     SpO2 96 %   Constitutional: Alert and oriented. Well appearing and in no acute distress. Eyes: Conjunctivae are normal. PERRL. EOMI. Head: Atraumatic. Nose: Positive congestion/rhinnorhea. Mouth/Throat: Mucous membranes are moist.  Oropharynx non-erythematous. Neck: No stridor.   Cardiovascular: Normal rate, regular rhythm. Good peripheral circulation. Grossly normal heart sounds.   Respiratory: Normal respiratory effort.  No retractions. Lungs CTAB. Gastrointestinal: Soft and nontender. No distention.  Musculoskeletal: No lower extremity tenderness nor edema. No gross deformities of  extremities. Neurologic:  Normal speech and language. No gross focal neurologic deficits are appreciated.  Skin:  Skin is warm, dry and intact. No rash noted. Psychiatric: Mood and affect are normal. Speech and behavior are normal.  ____________________________________________   LABS (all labs ordered are listed, but only  abnormal results are displayed)  Labs Reviewed  I-STAT CHEM 8, ED - Abnormal; Notable for the following:       Result Value   Chloride 98 (*)    Glucose, Bld 104 (*)    All other components within normal limits   ____________________________________________  EKG  Reviewed in MUSE. No STEMI.  ____________________________________________  RADIOLOGY  Dg Chest 2 View  Result Date: 01/30/2016 CLINICAL DATA:  Short of breath EXAM: CHEST  2 VIEW COMPARISON:  05/12/2014 FINDINGS: Normal heart size. Low lung volumes with bibasilar atelectasis. No pneumothorax. No pleural effusion. IMPRESSION: Bibasilar atelectasis. Electronically Signed   By: Jolaine ClickArthur  Hoss M.D.   On: 01/30/2016 14:38    ____________________________________________   PROCEDURES  Procedure(s) performed:   Procedures  None ____________________________________________   INITIAL IMPRESSION / ASSESSMENT AND PLAN / ED COURSE  Pertinent labs & imaging results that were available during my care of the patient were reviewed by me and considered in my medical decision making (see chart for details).  Patient presents to the emergency department for evaluation of the faculty breathing, nasal congestion and muscle aches. She does have some mild pitting edema in her lower extremities. No Rales on exam. No history of heart attack or congestive heart failure. Patient has relatively unremarkable lung exam. No chest pain. Tremulous suspicion for ACS without pain and only dyspnea with associated nasal congestion and muscle aches. Patient's lower extremity edema is bilateral. She has no tachycardia or hypoxemia to require further evaluation for DVT/PE. Will obtain chemistry to assess kidney function. The patient is living in a shelter. No evidence of active TB on chest x-ray. Patient feeling somewhat improved after breathing treatment. I did counsel her on smoking cessation. We'll treat with brief course of steroids and give Motrin for  muscle aches. Suspect bronchitis or other developing respiratory infection.   05:41PM Patient with improved respiratory symptoms. Reviewed my impression and plan with the patient. Will discharge home with steroids, albuterol, and note for bed rest at the shelter tomorrow.   At this time, I do not feel there is any life-threatening condition present. I have reviewed and discussed all results (EKG, imaging, lab, urine as appropriate), exam findings with patient. I have reviewed nursing notes and appropriate previous records.  I feel the patient is safe to be discharged home without further emergent workup. Discussed usual and customary return precautions. Patient and family (if present) verbalize understanding and are comfortable with this plan.  Patient will follow-up with their primary care provider. If they do not have a primary care provider, information for follow-up has been provided to them. All questions have been answered.    ____________________________________________  FINAL CLINICAL IMPRESSION(S) / ED DIAGNOSES  Final diagnoses:  Dyspnea  Nasal congestion     MEDICATIONS GIVEN DURING THIS VISIT:  Medications  albuterol (PROVENTIL) (2.5 MG/3ML) 0.083% nebulizer solution 5 mg (5 mg Nebulization Given 01/30/16 1432)  ipratropium-albuterol (DUONEB) 0.5-2.5 (3) MG/3ML nebulizer solution 3 mL (3 mLs Nebulization Given 01/30/16 1631)  predniSONE (DELTASONE) tablet 60 mg (60 mg Oral Given 01/30/16 1638)  ibuprofen (ADVIL,MOTRIN) tablet 800 mg (800 mg Oral Given 01/30/16 1723)     NEW OUTPATIENT MEDICATIONS STARTED DURING THIS VISIT:  New Prescriptions   ALBUTEROL (PROVENTIL HFA;VENTOLIN HFA) 108 (90 BASE) MCG/ACT INHALER    Inhale 2 puffs into the lungs every 4 (four) hours as needed for wheezing or shortness of breath.   PREDNISONE (DELTASONE) 10 MG TABLET    Take 4 tablets (40 mg total) by mouth daily.      Note:  This document was prepared using Dragon voice recognition  software and may include unintentional dictation errors.  Alona Bene, MD Emergency Medicine   Maia Plan, MD 01/30/16 [phone]

## 2016-01-30 NOTE — ED Triage Notes (Signed)
Patient presents for SOB, non productive cough and congestion x1 day. Denies fever, N/V.

## 2016-01-30 NOTE — ED Notes (Signed)
Pt given coca cola with MD permission.

## 2016-02-01 DIAGNOSIS — F4310 Post-traumatic stress disorder, unspecified: Secondary | ICD-10-CM

## 2016-02-01 NOTE — Congregational Nurse Program (Signed)
Congregational Nurse Program Note  Date of Encounter: 02/01/2016  Past Medical History: Past Medical History:  Diagnosis Date  . Abscess of mouth   . Anxiety   . Depression   . Hepatitis C   . Heroin abuse   . Hypertension     Encounter Details:     CNP Questionnaire - 01/23/16 2118      Patient Demographics   Is this a new or existing patient? New   Patient is considered a/an Not Applicable   Race Caucasian/White     Patient Assistance   Location of Patient Assistance Not Applicable   Patient's financial/insurance status Medicaid   Uninsured Patient No   Interventions Not Applicable   Patient referred to apply for the following financial assistance Not Applicable   Food insecurities addressed Not Applicable   Transportation assistance No   Assistance securing medications No   Educational health offerings Behavioral health     Encounter Details   Primary purpose of visit Chronic Illness/Condition Visit   Was an Emergency Department visit averted? No   Does patient have a medical provider? Yes   Patient referred to [facility]   Was a mental health screening completed? (GAINS tool) No   Does patient have dental issues? Yes   Was a dental referral made? Yes   Does patient have vision issues? No   Does your patient have an abnormal blood pressure today? No   Since previous encounter, have you referred patient for abnormal blood pressure that resulted in a new diagnosis or medication change? No   Does your patient have an abnormal blood glucose today? No   Since previous encounter, have you referred patient for abnormal blood glucose that resulted in a new diagnosis or medication change? No   Was there a life-saving intervention made? No      S- Alyssa Kelley [facility] by the [employer] [employer] O- Alyssa Kelley admits she has been clean of alcohol and drugs for four months ,she has the diagnosis of PTSD and sees a psychiatrist .  ALowella Bandy- Alyssa Kelley is stable and on medications  for her PTSD ,her only concern was about needing to see a dentist .She missed three appointments and was told that she can not schedule anymore .  P-[facility] for [facility] in getting another dental source .

## 2016-02-23 DIAGNOSIS — Z139 Encounter for screening, unspecified: Secondary | ICD-10-CM

## 2016-03-06 NOTE — Congregational Nurse Program (Signed)
Congregational Nurse Program Note  Date of Encounter: 02/23/2016  Past Medical History: Past Medical History:  Diagnosis Date  . Abscess of mouth   . Anxiety   . Depression   . Hepatitis C   . Heroin abuse   . Hypertension     Encounter Details:     CNP Questionnaire - 02/23/16 1121      Patient Demographics   Is this a new or existing patient? New   Patient is considered a/an Not Applicable   Race Caucasian/White     Patient Assistance   Location of Patient Assistance Not Applicable   Patient's financial/insurance status Low Income;[place] Card/Care Connects   Uninsured Patient Yes   Patient referred to apply for the following financial assistance Not Applicable   Food insecurities addressed Provided food supplies   Transportation assistance No   Assistance securing medications No   Educational health offerings Hypertension     Encounter Details   Primary purpose of visit Education/Health Concerns   Was an Emergency Department visit averted? Not Applicable   Does patient have a medical provider? Yes   Patient referred to Not Applicable   Was a mental health screening completed? (GAINS tool) No   Does patient have dental issues? No   Does patient have vision issues? No   Does your patient have an abnormal blood pressure today? No   Since previous encounter, have you referred patient for abnormal blood pressure that resulted in a new diagnosis or medication change? No   Does your patient have an abnormal blood glucose today? No   Since previous encounter, have you referred patient for abnormal blood glucose that resulted in a new diagnosis or medication change? No   Was there a life-saving intervention made? No     Clinic requesting B/P check.  B/P 120/80

## 2016-03-21 ENCOUNTER — Encounter: Payer: Self-pay | Admitting: Pediatric Intensive Care

## 2016-04-09 ENCOUNTER — Encounter (HOSPITAL_COMMUNITY): Payer: Self-pay

## 2016-04-09 ENCOUNTER — Emergency Department (HOSPITAL_COMMUNITY)
Admission: EM | Admit: 2016-04-09 | Discharge: 2016-04-10 | Disposition: A | Discharge status: Other Acute Inpatient Hospital | Payer: Self-pay | Attending: Emergency Medicine | Admitting: Emergency Medicine

## 2016-04-09 DIAGNOSIS — F1910 Other psychoactive substance abuse, uncomplicated: Secondary | ICD-10-CM | POA: Insufficient documentation

## 2016-04-09 DIAGNOSIS — R45851 Suicidal ideations: Secondary | ICD-10-CM

## 2016-04-09 DIAGNOSIS — F17210 Nicotine dependence, cigarettes, uncomplicated: Secondary | ICD-10-CM | POA: Insufficient documentation

## 2016-04-09 DIAGNOSIS — I10 Essential (primary) hypertension: Secondary | ICD-10-CM | POA: Insufficient documentation

## 2016-04-09 DIAGNOSIS — Z79899 Other long term (current) drug therapy: Secondary | ICD-10-CM | POA: Insufficient documentation

## 2016-04-09 DIAGNOSIS — Z590 Homelessness: Secondary | ICD-10-CM | POA: Insufficient documentation

## 2016-04-09 HISTORY — DX: Alcohol abuse, uncomplicated: F10.10

## 2016-04-09 HISTORY — DX: Opioid use, unspecified, uncomplicated: F11.90

## 2016-04-09 HISTORY — DX: Cocaine abuse, uncomplicated: F14.10

## 2016-04-09 HISTORY — DX: Sedative, hypnotic or anxiolytic abuse, uncomplicated: F13.10

## 2016-04-09 HISTORY — DX: Opioid dependence, uncomplicated: F11.20

## 2016-04-09 LAB — CBC
HCT: 43.5 % (ref 36.0–46.0)
Hemoglobin: 14.8 g/dL (ref 12.0–15.0)
MCH: 29.5 pg (ref 26.0–34.0)
MCHC: 34 g/dL (ref 30.0–36.0)
MCV: 86.8 fL (ref 78.0–100.0)
PLATELETS: 312 10*3/uL (ref 150–400)
RBC: 5.01 MIL/uL (ref 3.87–5.11)
RDW: 13.9 % (ref 11.5–15.5)
WBC: 8 10*3/uL (ref 4.0–10.5)

## 2016-04-09 LAB — COMPREHENSIVE METABOLIC PANEL
ALK PHOS: 117 U/L (ref 38–126)
ALT: 81 U/L — AB (ref 14–54)
AST: 112 U/L — AB (ref 15–41)
Albumin: 3.7 g/dL (ref 3.5–5.0)
Anion gap: 9 (ref 5–15)
CALCIUM: 9.7 mg/dL (ref 8.9–10.3)
CHLORIDE: 106 mmol/L (ref 101–111)
CO2: 24 mmol/L (ref 22–32)
CREATININE: 0.72 mg/dL (ref 0.44–1.00)
GFR calc Af Amer: >60 mL/min (ref >60)
GFR calc non Af Amer: >60 mL/min (ref >60)
GLUCOSE: 111 mg/dL — AB (ref 65–99)
Potassium: 4.2 mmol/L (ref 3.5–5.1)
SODIUM: 139 mmol/L (ref 135–145)
Total Bilirubin: 0.9 mg/dL (ref 0.3–1.2)
Total Protein: 9.1 g/dL — ABNORMAL HIGH (ref 6.5–8.1)

## 2016-04-09 LAB — RAPID URINE DRUG SCREEN, HOSP PERFORMED
AMPHETAMINES: NOT DETECTED
Barbiturates: NOT DETECTED
Benzodiazepines: POSITIVE — AB
Cocaine: POSITIVE — AB
OPIATES: NOT DETECTED
Tetrahydrocannabinol: NOT DETECTED

## 2016-04-09 LAB — SALICYLATE LEVEL: Salicylate Lvl: <7 mg/dL (ref 2.8–30.0)

## 2016-04-09 LAB — I-STAT BETA HCG BLOOD, ED (MC, WL, AP ONLY)

## 2016-04-09 LAB — ETHANOL: Alcohol, Ethyl (B): <5 mg/dL (ref <5)

## 2016-04-09 LAB — ACETAMINOPHEN LEVEL: Acetaminophen (Tylenol), Serum: 16 ug/mL (ref 10–30)

## 2016-04-09 MED ORDER — HYDROXYZINE HCL 25 MG PO TABS
25.0000 mg | ORAL_TABLET | Freq: Four times a day (QID) | ORAL | Status: DC | PRN
  Administered 2016-04-09 – 2016-04-10 (×2): 25 mg via ORAL
  Filled 2016-04-09 (×2): qty 1

## 2016-04-09 MED ORDER — GABAPENTIN 300 MG PO CAPS
300.0000 mg | ORAL_CAPSULE | Freq: Two times a day (BID) | ORAL | Status: DC
  Administered 2016-04-09: 300 mg via ORAL
  Filled 2016-04-09: qty 1

## 2016-04-09 MED ORDER — MIRTAZAPINE 15 MG PO TABS
7.5000 mg | ORAL_TABLET | Freq: Every day | ORAL | Status: DC
  Administered 2016-04-09: 7.5 mg via ORAL
  Filled 2016-04-09: qty 1

## 2016-04-09 MED ORDER — ONDANSETRON HCL 4 MG PO TABS
4.0000 mg | ORAL_TABLET | Freq: Three times a day (TID) | ORAL | Status: DC | PRN
  Administered 2016-04-09 – 2016-04-10 (×2): 4 mg via ORAL
  Filled 2016-04-09 (×2): qty 1

## 2016-04-09 MED ORDER — LORAZEPAM 1 MG PO TABS
0.0000 mg | ORAL_TABLET | Freq: Four times a day (QID) | ORAL | Status: DC
  Administered 2016-04-10: 1 mg via ORAL
  Filled 2016-04-09: qty 1

## 2016-04-09 MED ORDER — ALUM & MAG HYDROXIDE-SIMETH 200-200-20 MG/5ML PO SUSP: 30.0000 mL | ORAL | Status: DC | PRN

## 2016-04-09 MED ORDER — PRAZOSIN HCL 1 MG PO CAPS
1.0000 mg | ORAL_CAPSULE | Freq: Every day | ORAL | Status: DC
  Administered 2016-04-09: 1 mg via ORAL
  Filled 2016-04-09: qty 1

## 2016-04-09 MED ORDER — ZOLPIDEM TARTRATE 5 MG PO TABS
5.0000 mg | ORAL_TABLET | Freq: Every evening | ORAL | Status: DC | PRN
  Administered 2016-04-09: 5 mg via ORAL
  Filled 2016-04-09: qty 1

## 2016-04-09 MED ORDER — LORAZEPAM 1 MG PO TABS: 0.0000 mg | ORAL_TABLET | Freq: Two times a day (BID) | ORAL | Status: DC

## 2016-04-09 MED ORDER — IBUPROFEN 400 MG PO TABS: 600.0000 mg | ORAL_TABLET | Freq: Three times a day (TID) | ORAL | Status: DC | PRN

## 2016-04-09 MED ORDER — PROPRANOLOL HCL 10 MG PO TABS
10.0000 mg | ORAL_TABLET | Freq: Three times a day (TID) | ORAL | Status: DC
  Administered 2016-04-09 – 2016-04-10 (×3): 10 mg via ORAL
  Filled 2016-04-09 (×5): qty 1

## 2016-04-09 MED ORDER — THIAMINE HCL 100 MG/ML IJ SOLN: 100.0000 mg | Freq: Every day | INTRAMUSCULAR | Status: DC

## 2016-04-09 MED ORDER — VITAMIN B-1 100 MG PO TABS
100.0000 mg | ORAL_TABLET | Freq: Every day | ORAL | Status: DC
  Administered 2016-04-09 – 2016-04-10 (×2): 100 mg via ORAL
  Filled 2016-04-09 (×2): qty 1

## 2016-04-09 MED ORDER — PAROXETINE HCL 20 MG PO TABS
20.0000 mg | ORAL_TABLET | Freq: Every day | ORAL | Status: DC
  Administered 2016-04-10: 20 mg via ORAL
  Filled 2016-04-09: qty 1

## 2016-04-09 MED ORDER — ACETAMINOPHEN 325 MG PO TABS
650.0000 mg | ORAL_TABLET | ORAL | Status: DC | PRN
  Administered 2016-04-09 – 2016-04-10 (×3): 650 mg via ORAL
  Filled 2016-04-09 (×3): qty 2

## 2016-04-09 MED ORDER — TRAZODONE HCL 100 MG PO TABS
100.0000 mg | ORAL_TABLET | Freq: Every evening | ORAL | Status: DC | PRN
  Administered 2016-04-09: 100 mg via ORAL
  Filled 2016-04-09: qty 1

## 2016-04-09 MED ORDER — NICOTINE 21 MG/24HR TD PT24
21.0000 mg | MEDICATED_PATCH | Freq: Every day | TRANSDERMAL | Status: DC | PRN
  Administered 2016-04-09: 21 mg via TRANSDERMAL
  Filled 2016-04-09: qty 1

## 2016-04-09 NOTE — BH Assessment (Addendum)
Tele Assessment Note  Alyssa Kelley presents voluntarily to [facility] with SI and request for detox from various substances. Pt reports using crack cocaine, etoh and Xanax daily. She sts she has injected heroin daily for past 3 days after she was kicked out of methadone clinic for using illicit Xanax. Per chart review, pt was admitted to [facility] x 2 in 2016 for SI and substance abuse. Pt currently endorses SI. Pt denies any history of suicide attempts or of self-mutilation. She reports withdrawal sxs of NVD, muscle aches and tremors. Pt denies hx of seizures. Pt denies homicidal thoughts or physical aggression. Pt denies having access to firearms. Pt denies having any legal problems at this time. Pt is calm and cooperative during assessment.Pt denies hallucinations. Pt does not appear to be responding to internal stimuli and exhibits no delusional thought. Pt's reality testing appears to be intact. She denies having currently outpatient MH provider. She does state she gets script for Paxil from Dartha Lodge at [facility] and she has appt with Christin Fudge on 05/02/16. Her current depressive symptoms include fatigue, insomnia, poor appetite, guilt, irritability, hopelessness, worthlessness and isolating bx. She reports moderate anxiety d/t withdrawals. She reports decreased concentration  Alyssa Kelley is an 45 y.o. female.   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 Abscess of mouth   . Anxiety   . Benzodiazepine abuse   . Depression   . Hepatitis C   . Heroin abuse   . Hypertension   . Methadone use [facility])     Past Surgical History:  Procedure Laterality Date  . CESAREAN SECTION      Family History: No family history on file.  Social History:  reports that she has  been smoking Cigarettes.  She has a 15.00 pack-year smoking history. She has never used smokeless tobacco. She reports that she drinks about 50.4 oz of alcohol per week . She reports that she uses drugs, including Cocaine, Oxycodone, Heroin, and Benzodiazepines.  Additional Social History:  Alcohol / Drug Use Pain Medications: pt denies abuse - see pta meds list Prescriptions: pt reports using illicit benzos -  Over the Counter: pt denies abuse - see pta meds list History of alcohol / drug use?: Yes Longest period of sobriety (when/how long): few months Negative Consequences of Use: Financial, Legal, Personal relationships Substance #1 Name of Substance 1: etoh 1 - Age of First Use: 16 1 - Amount (size/oz): three 40 oz beers 1 - Frequency: daily 1 - Duration: years 1 - Last Use / Amount: 04/08/16 - more than three 40 oz beers Substance #2 Name of Substance 2: heroin - shooting up 2 - Age of First Use: 16 2 - Frequency: daily for past 3 days since getting kicked out of methadone clinic 2 - Last Use / Amount: 10/21 - $100 over past 3 days Substance #3 Name of Substance 3: benzos - Xanax 3 - Age of First Use: 16 3 - Amount (size/oz): 4 mg 3 - Frequency: daily 3 - Duration: years 3 - Last Use / Amount: 04/08/16 - 2 mg Substance #4 Name of Substance 4: crack cocaine 4 - Age of First Use: 16 4 - Amount (size/oz): $30 4 - Frequency: daily 4 - Duration: years 4 - Last Use / Amount: 04/09/16 - unknown amount  CIWA:  CIWA-Ar BP: 115/69 Pulse Rate: 61 COWS:    PATIENT STRENGTHS: (choose at least two) Average or above average intelligence Capable of independent living Communication skills General fund of knowledge  Allergies: No Known Allergies  Home Medications:  (Not in a hospital admission)  OB/GYN Status:  Patient's last menstrual period was 11/27/2015 (approximate).  General Assessment Data Location of Assessment: [facility] ED TTS Assessment: In system Is this a Tele or  Face-to-Face Assessment?: Tele Assessment Is this an Initial Assessment or a Re-assessment for this encounter?: Initial Assessment Marital status: Single Is patient pregnant?: Unknown Pregnancy Status: Unknown Living Arrangements: Non-relatives/Friends Can pt return to current living arrangement?:  ("i don't know") Admission Status: Voluntary Is patient capable of signing voluntary admission?: Yes Referral Source: Self/Family/Friend Insurance type: self pay     Crisis Care Plan Living Arrangements: Non-relatives/Friends Name of Psychiatrist: none Name of Therapist: none  Education Status Is patient currently in school?: No Highest grade of school patient has completed: 10  Risk to self with the past 6 months Suicidal Ideation: Yes-Currently Present Has patient been a risk to self within the past 6 months prior to admission? : Yes Suicidal Intent: Yes-Currently Present Has patient had any suicidal intent within the past 6 months prior to admission? : No Is patient at risk for suicide?: Yes Suicidal Plan?: No Has patient had any suicidal plan within the past 6 months prior to admission? : No Access to Means:  (n/a) What has been your use of drugs/alcohol within the last 12 months?: daily alcohol, crack, xanax and heroin use Previous Attempts/Gestures: No How many times?: 0 Other Self Harm Risks: none Triggers for Past Attempts:  (n/a) Intentional Self Injurious Behavior: None Family Suicide History: No Recent stressful life event(s): Turmoil (Comment), Recent negative physical changes (withdrawal symptoms and someplace to live) Persecutory voices/beliefs?: No Depression: Yes Depression Symptoms: Despondent, Insomnia, Guilt, Fatigue, Isolating, Feeling worthless/self pity, Feeling angry/irritable Substance abuse history and/or treatment for substance abuse?: Yes Suicide prevention information given to non-admitted patients: Not applicable  Risk to Others within the past 6  months Homicidal Ideation: No Does patient have any lifetime risk of violence toward others beyond the six months prior to admission? : No Thoughts of Harm to Others: No Current Homicidal Intent: No Current Homicidal Plan: No Access to Homicidal Means: No Identified Victim: none History of harm to others?: No Assessment of Violence: None Noted Violent Behavior Description: pt denies hx violence Does patient have access to weapons?: No Criminal Charges Pending?: No Does patient have a court date: No Is patient on probation?: No  Psychosis Hallucinations: None noted Delusions: None noted  Mental Status Report Appearance/Hygiene: Unremarkable (sheet pulled up around shoulders) Eye Contact: Fair Motor Activity: Freedom of movement, Unremarkable Speech: Logical/coherent Level of Consciousness: Quiet/awake Mood: Depressed, Sad, Anxious Affect: Appropriate to circumstance, Depressed, Sad Anxiety Level: Moderate (as a withdrawal symptom) Thought Processes: Relevant, Coherent Judgement: Impaired Orientation: Person, Place, Time, Situation Obsessive Compulsive Thoughts/Behaviors: None  Cognitive Functioning Concentration: Decreased Memory: Remote Intact, Recent Intact IQ: Average Insight: Fair Impulse Control: Poor Appetite: Poor Weight Loss: 0 Sleep: Decreased Total Hours of Sleep: 4 Vegetative Symptoms: None  ADLScreening [facility] Assessment Services) Patient's cognitive ability adequate to safely complete daily activities?: Yes Patient able to express need for assistance with ADLs?: Yes Independently performs ADLs?: Yes (appropriate for developmental age)  Prior Inpatient Therapy Prior Inpatient Therapy: Yes Prior Therapy Dates: 2016 and earlier dates Prior Therapy Facilty/Provider(s): [facility](x2), [facility], [facility] Reason for Treatment: substance abuse,  SI  Prior Outpatient Therapy Prior Outpatient Therapy: Yes Prior Therapy Dates: in the past Does patient have an ACCT  team?: No Does patient have Intensive In-House Services?  : No Does patient have [facility] services? : Unknown Does patient have P4CC services?: Unknown  ADL Screening (condition at time of admission) Patient's cognitive ability adequate to safely complete daily activities?: Yes Is the patient deaf or have difficulty hearing?: No Does the patient have difficulty seeing, even when wearing glasses/contacts?: No Does the patient have difficulty concentrating, remembering, or making decisions?: No Patient able to express need for assistance with ADLs?: Yes Does the patient have difficulty dressing or bathing?: No Independently performs ADLs?: Yes (appropriate for developmental age) Does the patient have difficulty walking or climbing stairs?: No Weakness of Legs: None Weakness of Arms/Hands: None  Home Assistive Devices/Equipment Home Assistive Devices/Equipment: None    Abuse/Neglect Assessment (Assessment to be complete while patient is alone) Physical Abuse: Denies Verbal Abuse: Denies Sexual Abuse: Denies Exploitation of patient/patient's resources: Denies Self-Neglect: Denies     Merchant navy officerAdvance Directives (For Healthcare) Does patient have an advance directive?: No Would patient like information on creating an advanced directive?: No - patient declined information    Additional Information 1:1 In Past 12 Months?: No CIRT Risk: No Elopement Risk: No Does patient have medical clearance?: Yes     Disposition:  Disposition Initial Assessment Completed for this Encounter: Yes Disposition of Patient: Inpatient treatment program Type of inpatient treatment program: Adult (laura davis FNP recommends inpatient treatment)  Shawnell Dykes P 04/09/2016 5:54 PM

## 2016-04-09 NOTE — ED Provider Notes (Signed)
[facility]-EMERGENCY DEPT Provider Note   CSN: [id number] Arrival date & time: 04/09/16  1416     History   Chief Complaint Chief Complaint  Patient presents with  . Suicidal  . detox    HPI Alyssa Kelley is a 46 y.o. female.  She presents for evaluation suicidal ideation, and to get help for detoxification from methadone and benzodiazepines. She was "kicked out" from the methadone clinic 5 days ago because she was using illicit benzodiazepines that she got off the street. Since then she has been feeling nervous, and achy. She is also having thoughts of suicide, without a plan. No history of suicidal attempt. She has been drinking alcohol "two 40s a day". She has been living with friends but does not have a permanent domicile since leaving a shelter, about a month ago. She denies fever or chills, shortness of breath nausea vomiting weakness or dizziness. There are no other known modifying factors.  HPI  Past Medical History:  Diagnosis Date  . Abscess of mouth   . Alcohol abuse   . Anxiety   . Benzodiazepine abuse   . Cocaine abuse   . Depression   . Hepatitis C   . Heroin abuse   . Hypertension   . Methadone use [facility])     Patient Active Problem List   Diagnosis Date Noted  . UTI (urinary tract infection) 12/07/2014  . Alcohol dependence with alcohol-induced mood disorder (HCC)   . Major depressive disorder, recurrent episode, severe (HCC) 12/04/2014  . Opioid use disorder, moderate, dependence (HCC) 12/04/2014  . Alcohol use disorder, severe, dependence (HCC) 11/12/2014  . Alcohol abuse 09/10/2013  . Cocaine abuse 09/10/2013    Past Surgical History:  Procedure Laterality Date  . CESAREAN SECTION      OB History    No data available       Home Medications    Prior to Admission medications   Medication Sig Start Date End Date Taking? Authorizing Provider  doxepin (SINEQUAN) 25 MG capsule Take 25 mg by mouth at bedtime.   Yes Historical Provider, MD    gabapentin (NEURONTIN) 300 MG capsule Take 1 capsule (300 mg total) by mouth 2 (two) times daily. Patient taking differently: Take 300 mg by mouth 3 (three) times daily.  12/08/14  Yes Rachael Fee, MD  hydrOXYzine (ATARAX/VISTARIL) 25 MG tablet Take 1 tablet (25 mg total) by mouth 3 (three) times daily. 12/08/14  Yes Rachael Fee, MD  PARoxetine (PAXIL) 30 MG tablet Take 30 mg by mouth daily.   Yes Historical Provider, MD  prazosin (MINIPRESS) 1 MG capsule Take 1 capsule (1 mg total) by mouth at bedtime. 12/08/14  Yes Rachael Fee, MD  propranolol (INDERAL) 10 MG tablet Take 1 tablet (10 mg total) by mouth 3 (three) times daily. For anxiety 12/08/14  Yes Rachael Fee, MD  albuterol (PROVENTIL HFA;VENTOLIN HFA) 108 (90 Base) MCG/ACT inhaler Inhale 2 puffs into the lungs every 4 (four) hours as needed for wheezing or shortness of breath. Patient not taking: Reported on 04/09/2016 01/30/16   Maia Plan, MD    Family History No family history on file.  Social History Social History  Substance Use Topics  . Smoking status: Current Every Day Smoker    Packs/day: 1.00    Years: 15.00    Types: Cigarettes  . Smokeless tobacco: Never Used  . Alcohol use 50.4 oz/week    84 Cans of beer per week  Comment: 1 12-pack daiy     Allergies   Review of patient's allergies indicates no known allergies.   Review of Systems Review of Systems  All other systems reviewed and are negative.    Physical Exam Updated Vital Signs BP (!) 126/103 (BP Location: Left Arm)   Pulse 68   Temp 98.5 F (36.9 C) (Oral)   Resp 18   Ht 5\' 1"  (1.549 m)   Wt 202 lb (91.6 kg)   LMP 11/27/2015 (Approximate)   SpO2 100%   BMI 38.17 kg/m   Physical Exam  Constitutional: She is oriented to person, place, and time. She appears well-developed and well-nourished.  HENT:  Head: Normocephalic and atraumatic.  Eyes: Conjunctivae and EOM are normal. Pupils are equal, round, and reactive to light.  Neck:  Normal range of motion and phonation normal. Neck supple.  Cardiovascular: Normal rate and regular rhythm.   Pulmonary/Chest: She is in respiratory distress. She exhibits no tenderness.  Musculoskeletal: Normal range of motion.  Neurological: She is alert and oriented to person, place, and time. She exhibits normal muscle tone.  No dysarthria and aphasia or nystagmus  Skin: Skin is warm and dry.  Psychiatric: She has a normal mood and affect. Her behavior is normal. Judgment and thought content normal.  Nursing note and vitals reviewed.    ED Treatments / Results  Labs (all labs ordered are listed, but only abnormal results are displayed) Labs Reviewed  COMPREHENSIVE METABOLIC PANEL - Abnormal; Notable for the following:       Result Value   Glucose, Bld 111 (*)    BUN <5 (*)    Total Protein 9.1 (*)    AST 112 (*)    ALT 81 (*)    All other components within normal limits  RAPID URINE DRUG SCREEN, HOSP PERFORMED - Abnormal; Notable for the following:    Cocaine POSITIVE (*)    Benzodiazepines POSITIVE (*)    All other components within normal limits  CBC  ACETAMINOPHEN LEVEL  SALICYLATE LEVEL  ETHANOL  I-STAT BETA HCG BLOOD, ED ([facility], [facility], [facility] ONLY)    EKG  EKG Interpretation None       Radiology No results found.  Procedures Procedures (including critical care time)  Medications Ordered in ED Medications  hydrOXYzine (ATARAX/VISTARIL) tablet 25 mg (25 mg Oral Given 04/09/16 1715)  gabapentin (NEURONTIN) capsule 300 mg (300 mg Oral Given 04/09/16 2305)  mirtazapine (REMERON) tablet 7.5 mg (7.5 mg Oral Given 04/09/16 2304)  PARoxetine (PAXIL) tablet 20 mg (20 mg Oral Not Given 04/09/16 1719)  prazosin (MINIPRESS) capsule 1 mg (1 mg Oral Given 04/09/16 2305)  propranolol (INDERAL) tablet 10 mg (10 mg Oral Given 04/09/16 1756)  traZODone (DESYREL) tablet 100 mg (100 mg Oral Given 04/09/16 2305)  acetaminophen (TYLENOL) tablet 650 mg (650 mg Oral Given 04/09/16 1715)   ibuprofen (ADVIL,MOTRIN) tablet 600 mg (not administered)  zolpidem (AMBIEN) tablet 5 mg (5 mg Oral Given 04/09/16 2305)  nicotine (NICODERM CQ - dosed in mg/24 hours) patch 21 mg (21 mg Transdermal Patch Applied 04/09/16 1716)  ondansetron (ZOFRAN) tablet 4 mg (4 mg Oral Given 04/09/16 1758)  alum & mag hydroxide-simeth (MAALOX/MYLANTA) 200-200-20 MG/5ML suspension 30 mL (not administered)  LORazepam (ATIVAN) tablet 0-4 mg (0 mg Oral Not Given 04/09/16 1839)    Followed by  LORazepam (ATIVAN) tablet 0-4 mg (not administered)  thiamine (VITAMIN B-1) tablet 100 mg (100 mg Oral Given 04/09/16 1847)    Or  thiamine (B-1)  injection 100 mg ( Intravenous See Alternative 04/09/16 1847)     Initial Impression / Assessment and Plan / ED Course  I have reviewed the triage vital signs and the nursing notes.  Pertinent labs & imaging results that were available during my care of the patient were reviewed by me and considered in my medical decision making (see chart for details).  Clinical Course  Value Comment By Time   At this time. She is medically cleared for treatment by psychiatry Mancel Bale, MD 10/22 1900  COCAINE: (!) POSITIVE Substance abuse Mancel Bale, MD 10/22 2309  Benzodiazepines: (!) POSITIVE Substance abuse Mancel Bale, MD 10/22 2310   Medications  hydrOXYzine (ATARAX/VISTARIL) tablet 25 mg (25 mg Oral Given 04/09/16 1715)  gabapentin (NEURONTIN) capsule 300 mg (300 mg Oral Given 04/09/16 2305)  mirtazapine (REMERON) tablet 7.5 mg (7.5 mg Oral Given 04/09/16 2304)  PARoxetine (PAXIL) tablet 20 mg (20 mg Oral Not Given 04/09/16 1719)  prazosin (MINIPRESS) capsule 1 mg (1 mg Oral Given 04/09/16 2305)  propranolol (INDERAL) tablet 10 mg (10 mg Oral Given 04/09/16 1756)  traZODone (DESYREL) tablet 100 mg (100 mg Oral Given 04/09/16 2305)  acetaminophen (TYLENOL) tablet 650 mg (650 mg Oral Given 04/09/16 1715)  ibuprofen (ADVIL,MOTRIN) tablet 600 mg (not administered)    zolpidem (AMBIEN) tablet 5 mg (5 mg Oral Given 04/09/16 2305)  nicotine (NICODERM CQ - dosed in mg/24 hours) patch 21 mg (21 mg Transdermal Patch Applied 04/09/16 1716)  ondansetron (ZOFRAN) tablet 4 mg (4 mg Oral Given 04/09/16 1758)  alum & mag hydroxide-simeth (MAALOX/MYLANTA) 200-200-20 MG/5ML suspension 30 mL (not administered)  LORazepam (ATIVAN) tablet 0-4 mg (0 mg Oral Not Given 04/09/16 1839)    Followed by  LORazepam (ATIVAN) tablet 0-4 mg (not administered)  thiamine (VITAMIN B-1) tablet 100 mg (100 mg Oral Given 04/09/16 1847)    Or  thiamine (B-1) injection 100 mg ( Intravenous See Alternative 04/09/16 1847)    Patient Vitals for the past 24 hrs:  BP Temp Temp src Pulse Resp SpO2 Height Weight  04/09/16 2303 (!) 126/103 98.5 F (36.9 C) Oral 68 18 100 % - -  04/09/16 1759 115/69 - - 61 - - - -  04/09/16 1749 115/69 98.5 F (36.9 C) Oral 61 19 98 % - -  04/09/16 1428 138/96 98.9 F (37.2 C) Oral 71 20 97 % 5\' 1"  (1.549 m) 202 lb (91.6 kg)    TTS consult   Final Clinical Impressions(s) / ED Diagnoses   Final diagnoses:  Suicidal ideation  Polysubstance abuse    Polysubstance abuse with suicidal ideation, and homelessness. No signs of overt withdrawal symptoms.  Nursing Notes Reviewed/ Care Coordinated Applicable Imaging Reviewed Interpretation of Laboratory Data incorporated into ED treatment  Plan: As per TTS and oncoming provider team  New Prescriptions New Prescriptions   No medications on file     Mancel Bale, MD 04/09/16 2312

## 2016-04-09 NOTE — ED Notes (Signed)
Pt has appt w/Anthony at [facility] on 05/02/16.

## 2016-04-09 NOTE — ED Notes (Signed)
Pt given Vistaril as requested.

## 2016-04-09 NOTE — ED Notes (Signed)
Per Lab, did not receive specimen for ETOH, Salicylate and Acetaminophen labs. Phlebotomist aware and will obtain. Pt aware and is in agreement w/tx plan.

## 2016-04-09 NOTE — ED Notes (Signed)
TTS being performed.  

## 2016-04-09 NOTE — ED Notes (Signed)
Zofran given as requested.

## 2016-04-09 NOTE — ED Notes (Addendum)
2 labeled belongings bags placed at nurses' desk for inventory. Pt noted to be wearing rings x 3 and hoop earrings.

## 2016-04-09 NOTE — ED Notes (Signed)
Sitter at bedside.

## 2016-04-09 NOTE — ED Notes (Signed)
Patient changed in purple scrubs, security notified of wanding, and staffing notified of needed sitter

## 2016-04-09 NOTE — ED Triage Notes (Signed)
Patient here requesting detox and states that she is having suicidal thoughts the past 3-4 days, hx of same. Last used methadone, benzos, ETOH, heroin yesterday. History of depression and states that she takes her depression meds as prescribed daily. Flat affect, alert and oriented on assessment. Cooperative and calm

## 2016-04-09 NOTE — ED Notes (Signed)
Pt verbalized understanding and signed Medical Clearance Pt Policy form. Copy given to pt. Pt states friend brought her to ED.

## 2016-04-09 NOTE — ED Notes (Signed)
Phlebotomist w/pt.

## 2016-04-10 ENCOUNTER — Encounter (HOSPITAL_COMMUNITY): Payer: Self-pay | Admitting: *Deleted

## 2016-04-10 ENCOUNTER — Inpatient Hospital Stay (HOSPITAL_COMMUNITY)
Admission: AD | Admit: 2016-04-10 | Discharge: 2016-04-19 | DRG: 897 | Disposition: A | Discharge status: Home / Self Care | Payer: Federal, State, Local not specified - Other | Source: Intra-hospital | Attending: Psychiatry | Admitting: Psychiatry

## 2016-04-10 DIAGNOSIS — I10 Essential (primary) hypertension: Secondary | ICD-10-CM | POA: Diagnosis present

## 2016-04-10 DIAGNOSIS — R45851 Suicidal ideations: Secondary | ICD-10-CM | POA: Diagnosis present

## 2016-04-10 DIAGNOSIS — G4700 Insomnia, unspecified: Secondary | ICD-10-CM | POA: Diagnosis present

## 2016-04-10 DIAGNOSIS — F1310 Sedative, hypnotic or anxiolytic abuse, uncomplicated: Principal | ICD-10-CM | POA: Diagnosis present

## 2016-04-10 DIAGNOSIS — Z79899 Other long term (current) drug therapy: Secondary | ICD-10-CM | POA: Diagnosis not present

## 2016-04-10 DIAGNOSIS — F17210 Nicotine dependence, cigarettes, uncomplicated: Secondary | ICD-10-CM | POA: Diagnosis present

## 2016-04-10 DIAGNOSIS — F1120 Opioid dependence, uncomplicated: Secondary | ICD-10-CM | POA: Diagnosis present

## 2016-04-10 DIAGNOSIS — Z23 Encounter for immunization: Secondary | ICD-10-CM

## 2016-04-10 DIAGNOSIS — F1994 Other psychoactive substance use, unspecified with psychoactive substance-induced mood disorder: Secondary | ICD-10-CM | POA: Diagnosis present

## 2016-04-10 MED ORDER — LORAZEPAM 1 MG PO TABS: 1.0000 mg | ORAL_TABLET | Freq: Every day | ORAL | Status: DC

## 2016-04-10 MED ORDER — LORAZEPAM 1 MG PO TABS
1.0000 mg | ORAL_TABLET | Freq: Four times a day (QID) | ORAL | Status: DC
  Administered 2016-04-10 – 2016-04-11 (×3): 1 mg via ORAL
  Filled 2016-04-10 (×3): qty 1

## 2016-04-10 MED ORDER — ALUM & MAG HYDROXIDE-SIMETH 200-200-20 MG/5ML PO SUSP
30.0000 mL | ORAL | Status: DC | PRN
  Administered 2016-04-10: 30 mL via ORAL
  Filled 2016-04-10: qty 30

## 2016-04-10 MED ORDER — NICOTINE 21 MG/24HR TD PT24
21.0000 mg | MEDICATED_PATCH | Freq: Every day | TRANSDERMAL | Status: DC
  Administered 2016-04-10 – 2016-04-19 (×10): 21 mg via TRANSDERMAL
  Filled 2016-04-10 (×4): qty 1
  Filled 2016-04-10: qty 14
  Filled 2016-04-10 (×7): qty 1
  Filled 2016-04-10: qty 14
  Filled 2016-04-10: qty 1

## 2016-04-10 MED ORDER — PROPRANOLOL HCL 10 MG PO TABS
10.0000 mg | ORAL_TABLET | Freq: Three times a day (TID) | ORAL | Status: DC
  Administered 2016-04-10 – 2016-04-11 (×2): 10 mg via ORAL
  Filled 2016-04-10 (×7): qty 1

## 2016-04-10 MED ORDER — ADULT MULTIVITAMIN W/MINERALS CH
1.0000 | ORAL_TABLET | Freq: Every day | ORAL | Status: DC
  Administered 2016-04-10 – 2016-04-19 (×10): 1 via ORAL
  Filled 2016-04-10 (×14): qty 1

## 2016-04-10 MED ORDER — INFLUENZA VAC SPLIT QUAD 0.5 ML IM SUSY
0.5000 mL | PREFILLED_SYRINGE | INTRAMUSCULAR | Status: AC
  Administered 2016-04-11: 0.5 mL via INTRAMUSCULAR
  Filled 2016-04-10: qty 0.5

## 2016-04-10 MED ORDER — MAGNESIUM HYDROXIDE 400 MG/5ML PO SUSP: 30.0000 mL | Freq: Every day | ORAL | Status: DC | PRN

## 2016-04-10 MED ORDER — MIRTAZAPINE 7.5 MG PO TABS
7.5000 mg | ORAL_TABLET | Freq: Every day | ORAL | Status: DC
  Administered 2016-04-10: 7.5 mg via ORAL
  Filled 2016-04-10 (×3): qty 1

## 2016-04-10 MED ORDER — GABAPENTIN 300 MG PO CAPS
300.0000 mg | ORAL_CAPSULE | Freq: Three times a day (TID) | ORAL | Status: DC
  Administered 2016-04-10 – 2016-04-14 (×11): 300 mg via ORAL
  Filled 2016-04-10 (×18): qty 1

## 2016-04-10 MED ORDER — GABAPENTIN 300 MG PO CAPS
300.0000 mg | ORAL_CAPSULE | Freq: Three times a day (TID) | ORAL | Status: DC
  Administered 2016-04-10: 300 mg via ORAL
  Filled 2016-04-10: qty 1

## 2016-04-10 MED ORDER — NICOTINE 21 MG/24HR TD PT24
21.0000 mg | MEDICATED_PATCH | Freq: Every day | TRANSDERMAL | Status: DC | PRN
  Filled 2016-04-10: qty 1

## 2016-04-10 MED ORDER — LORAZEPAM 1 MG PO TABS
1.0000 mg | ORAL_TABLET | Freq: Four times a day (QID) | ORAL | Status: DC | PRN
  Administered 2016-04-11: 1 mg via ORAL
  Filled 2016-04-10: qty 1

## 2016-04-10 MED ORDER — HYDROXYZINE HCL 25 MG PO TABS
25.0000 mg | ORAL_TABLET | Freq: Four times a day (QID) | ORAL | Status: AC | PRN
  Administered 2016-04-11 – 2016-04-12 (×4): 25 mg via ORAL
  Filled 2016-04-10 (×4): qty 1

## 2016-04-10 MED ORDER — TRAZODONE HCL 50 MG PO TABS
50.0000 mg | ORAL_TABLET | Freq: Every evening | ORAL | Status: DC | PRN
  Administered 2016-04-10 – 2016-04-12 (×4): 50 mg via ORAL
  Filled 2016-04-10 (×11): qty 1

## 2016-04-10 MED ORDER — ACETAMINOPHEN 325 MG PO TABS
650.0000 mg | ORAL_TABLET | Freq: Four times a day (QID) | ORAL | Status: DC | PRN
  Administered 2016-04-10 – 2016-04-19 (×17): 650 mg via ORAL
  Filled 2016-04-10 (×17): qty 2

## 2016-04-10 MED ORDER — ONDANSETRON 4 MG PO TBDP
4.0000 mg | ORAL_TABLET | Freq: Four times a day (QID) | ORAL | Status: AC | PRN
  Administered 2016-04-10 – 2016-04-13 (×7): 4 mg via ORAL
  Filled 2016-04-10 (×9): qty 1

## 2016-04-10 MED ORDER — LORAZEPAM 1 MG PO TABS: 1.0000 mg | ORAL_TABLET | Freq: Three times a day (TID) | ORAL | Status: DC

## 2016-04-10 MED ORDER — LOPERAMIDE HCL 2 MG PO CAPS
2.0000 mg | ORAL_CAPSULE | ORAL | Status: AC | PRN
  Administered 2016-04-12: 4 mg via ORAL
  Filled 2016-04-10: qty 2

## 2016-04-10 MED ORDER — PRAZOSIN HCL 1 MG PO CAPS
1.0000 mg | ORAL_CAPSULE | Freq: Every day | ORAL | Status: DC
  Administered 2016-04-10 – 2016-04-18 (×9): 1 mg via ORAL
  Filled 2016-04-10 (×2): qty 1
  Filled 2016-04-10 (×2): qty 14
  Filled 2016-04-10 (×7): qty 1

## 2016-04-10 MED ORDER — LORAZEPAM 1 MG PO TABS: 1.0000 mg | ORAL_TABLET | Freq: Two times a day (BID) | ORAL | Status: DC

## 2016-04-10 MED ORDER — PAROXETINE HCL 20 MG PO TABS
20.0000 mg | ORAL_TABLET | Freq: Every day | ORAL | Status: DC
  Administered 2016-04-10 – 2016-04-11 (×2): 20 mg via ORAL
  Filled 2016-04-10 (×5): qty 1

## 2016-04-10 NOTE — ED Notes (Signed)
Breakfast order sent  

## 2016-04-10 NOTE — Tx Team (Signed)
Initial Treatment Plan 04/10/2016 4:46 PM Alyssa Kelley [id number]    PATIENT STRESSORS: Financial difficulties Substance abuse   PATIENT STRENGTHS: Ability for insight Communication skills Motivation for treatment/growth Physical Health   PATIENT IDENTIFIED PROBLEMS: At risk for suicide  Substance Abuse  Depression  "Drugs"  "Depression"             DISCHARGE CRITERIA:  Ability to meet basic life and health needs Adequate post-discharge living arrangements Improved stabilization in mood, thinking, and/or behavior Motivation to continue treatment in a less acute level of care Need for constant or close observation no longer present Verbal commitment to aftercare and medication compliance Withdrawal symptoms are absent or subacute and managed without 24-hour nursing intervention  PRELIMINARY DISCHARGE PLAN: Attend 12-step recovery group Outpatient therapy Placement in alternative living arrangements  PATIENT/FAMILY INVOLVEMENT: This treatment plan has been presented to and reviewed with the patient, Alyssa Kelley.  The patient and family have been given the opportunity to ask questions and make suggestions.  Carleene OverlieMiddleton, Ramiel Forti P, RN 04/10/2016, 4:46 PM

## 2016-04-10 NOTE — Progress Notes (Signed)
Spoke with Fransisca KaufmannLaura Davis NP to discuss patients placement, patient with active SI and Polysubstance abuse. Patient accepted for inpatient treatment at this time. Will assign to 304-1 and patient may arrive at 1315.Attending will be Dr. Mckinley Jewelates.

## 2016-04-10 NOTE — Progress Notes (Signed)
Admission Note:  45 year old female who presents voluntary, in no acute distress, for the treatment of SI, Substance abuse, and Depression. Patient appears flat and depressed. Patient was calm and cooperative with admission process. Patient presents with passive SI and contracts for safety upon admission. Patient denies AVH.  Patient reports SI without a plan and wanting to detox from Methadone, Benzos, Cocaine, and Alcohol with last use of all being "Saturday".  Patient verbalizes feelings of hopelessness.  Patient reports that she does not have any place to stay and states that [facility] and staying at various friend's homes.  Patient is unsure where she will go upon discharge and is unable to identify a support system.  Patient reports that her parents are deceased and that she is an only child.  Patient has 2 children, ages 45 years old and 45 years old, who live with patient's aunt.  [facility], patient would like to work on "drugs" and "depression".  Skin was assessed and found to be clear of any abnormal marks apart from bruise to left lower leg and scratches on right knee. Patient searched and no contraband found, POC and unit policies explained and understanding verbalized. Consents obtained. Food and fluids offered, and fluids accepted. Patient had no additional questions or concerns.

## 2016-04-10 NOTE — Progress Notes (Signed)
Psychoeducational Group Note  Date:  04/10/2016 Time:  2313  Group Topic/Focus:  Wrap-Up Group:   The focus of this group is to help patients review their daily goal of treatment and discuss progress on daily workbooks.   Participation Level: Did Not Attend  Participation Quality:  Not Applicable  Affect:  Not Applicable  Cognitive:  Not Applicable  Insight:  Not Applicable  Engagement in Group: Not Applicable  Additional Comments:  The patient did not attend group this evening.    Daqwan Dougal S 04/10/2016, 11:13 PM

## 2016-04-10 NOTE — ED Notes (Signed)
Called [facility] at [facility]. She states she anticipates not being able to accept the patient until after 1900.

## 2016-04-10 NOTE — BH Assessment (Signed)
[facility]   This clinician referred patient to the following facilities: [facility], [facility], [facility], [facility].  CSW will follow up on referrals.

## 2016-04-10 NOTE — Progress Notes (Signed)
Patient ID: Alyssa Kelley, female   DOB: 1971/05/07, 45 y.o.   MRN: [id number]   Pt currently presents with a flat affect and sullen behavior. Pt reports to writer that their goal is to "feel better." Pt states "I need to focus on this and then I can make a goal." Pt reports good sleep with current medication regimen. Pt in bed during evening group. Reports signs and symptoms of withdrawal including nausea, generalized pain and agitation.   Pt provided with medications per providers orders. Pt's labs and vitals were monitored throughout the night. Pt supported emotionally and encouraged to express concerns and questions. Pt educated on medications.  Pt's safety ensured with 15 minute and environmental checks. Pt currently denies SI/HI and A/V hallucinations. Pt verbally agrees to seek staff if SI/HI or A/VH occurs and to consult with staff before acting on any harmful thoughts. Will continue POC.

## 2016-04-10 NOTE — BH Assessment (Signed)
Binnie RailJoann Glover, [facility], said observation bed will be available after 0800. Nira ConnJason Berry, NP accepted Pt to observation bed under the service of Dr. Alyse LowA. Kumar. Notified Dr. Wilkie AyeHorton and Autumn, RN of acceptance.   Harlin RainFord Ellis Patsy BaltimoreWarrick Jr, LPC, [facility], [facility] Triage Specialist [phone]

## 2016-04-10 NOTE — ED Provider Notes (Signed)
Patient accepted by [facility] by Dr. Mckinley Jewelates.   Vanetta MuldersScott Elford Evilsizer, MD 04/10/16 1304

## 2016-04-10 NOTE — ED Notes (Signed)
Called to room by patient. Pt requesting medication for "withdrawal". Pt c/o pain all over and shaking.

## 2016-04-10 NOTE — ED Notes (Signed)
Per Ala DachFord pt can be transferred to OBS after 8am

## 2016-04-10 NOTE — ED Notes (Signed)
Patient received breakfast tray 

## 2016-04-10 NOTE — ED Notes (Signed)
Day shift sitter at bedside 

## 2016-04-11 DIAGNOSIS — F1120 Opioid dependence, uncomplicated: Secondary | ICD-10-CM | POA: Diagnosis present

## 2016-04-11 MED ORDER — DICYCLOMINE HCL 20 MG PO TABS
20.0000 mg | ORAL_TABLET | Freq: Four times a day (QID) | ORAL | Status: AC | PRN
  Administered 2016-04-13: 20 mg via ORAL
  Filled 2016-04-11: qty 1

## 2016-04-11 MED ORDER — CHLORDIAZEPOXIDE HCL 25 MG PO CAPS
25.0000 mg | ORAL_CAPSULE | Freq: Three times a day (TID) | ORAL | Status: AC
  Administered 2016-04-12 (×3): 25 mg via ORAL
  Filled 2016-04-11 (×3): qty 1

## 2016-04-11 MED ORDER — CHLORDIAZEPOXIDE HCL 25 MG PO CAPS
25.0000 mg | ORAL_CAPSULE | Freq: Four times a day (QID) | ORAL | Status: AC | PRN
  Administered 2016-04-12: 25 mg via ORAL
  Filled 2016-04-11: qty 1

## 2016-04-11 MED ORDER — CHLORDIAZEPOXIDE HCL 25 MG PO CAPS
25.0000 mg | ORAL_CAPSULE | Freq: Four times a day (QID) | ORAL | Status: AC
  Administered 2016-04-11 (×3): 25 mg via ORAL
  Filled 2016-04-11 (×3): qty 1

## 2016-04-11 MED ORDER — PAROXETINE HCL 20 MG PO TABS
40.0000 mg | ORAL_TABLET | Freq: Every day | ORAL | Status: DC
  Administered 2016-04-11 – 2016-04-18 (×8): 40 mg via ORAL
  Filled 2016-04-11 (×6): qty 2
  Filled 2016-04-11: qty 28
  Filled 2016-04-11: qty 2
  Filled 2016-04-11: qty 28
  Filled 2016-04-11: qty 2

## 2016-04-11 MED ORDER — CHLORDIAZEPOXIDE HCL 25 MG PO CAPS
25.0000 mg | ORAL_CAPSULE | ORAL | Status: AC
  Administered 2016-04-13 (×2): 25 mg via ORAL
  Filled 2016-04-11 (×2): qty 1

## 2016-04-11 MED ORDER — CHLORDIAZEPOXIDE HCL 25 MG PO CAPS
25.0000 mg | ORAL_CAPSULE | Freq: Every day | ORAL | Status: AC
  Administered 2016-04-14: 25 mg via ORAL
  Filled 2016-04-11: qty 1

## 2016-04-11 NOTE — Progress Notes (Signed)
D: Alyssa Kelley endorses thoughts of harming herself, but she contracts for safety on the unit. She says she aches all over, rating her pain a 9/10. She has asked for PRNs for pain and anxiety, which have been given when available. She has been cooperative and appropriate. She denied HI/AVH. On her self inventory form, she rated her depression, anxiety, and hopelessness all 10/10. She appears dysphoric.  A: Meds given as ordered, including PRNs (see MAR). Q15 safety checks maintained. Support/encouragement offered.  R: Pt remains free from harm and continues with treatment. Will continue to monitor for needs/safety.

## 2016-04-11 NOTE — BHH Group Notes (Signed)
Patient attend group. 
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
## 2016-04-11 NOTE — H&P (Addendum)
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 Opioid use disorder, severe, dependence ([place]) [F11.20] 04/11/2016  . Substance induced mood disorder ([place]) [F19.94] 04/10/2016  . UTI (urinary tract infection) [N39.0] 12/07/2014  . Alcohol dependence with alcohol-induced mood disorder ([place]) [F10.24]   . Major depressive disorder, recurrent episode, severe ([place]) [F33.2] 12/04/2014  . Opioid use disorder, moderate, dependence ([place]) [F11.20] 12/04/2014  . Alcohol use disorder, severe, dependence ([place]) [F10.20] 11/12/2014  . Alcohol abuse [F10.10] 09/10/2013  . Cocaine abuse [F14.10] 09/10/2013   History of Present Illness: The patient reports that she was "kicked out" of the methadone clinic for using benzodiazepines about 5 days ago. She reports that she's had several weeks of feeling depressed mood hopelessness and loneliness decreased sleep and suicidal ideations as well. Currently she reports some shaking and nausea with opiate withdrawal.  Currently she endorses passive suicidal ideation stating "I wish I was dead." She denies any psychotic or mania symptoms currently and she denies any homicidal ideation, plan or intent  She states she started using drugs around age 56 after the death of her parents. Her father was murdered and her mother died about 8 months later of cirrhosis of the liver related to alcohol use.  Patient reports she would like to start clonidine taper protocol for methadone withdrawal and she would also like to switch to Librium from Ativan for her benzodiazepine/alcohol detox. Associated  Signs/Symptoms: Depression Symptoms:  depressed mood, insomnia, feelings of worthlessness/guilt, hopelessness, suicidal thoughts without plan, (Hypo) Manic Symptoms:  None Anxiety Symptoms:  Excessive Worry, Psychotic Symptoms:  None PTSD Symptoms: Negative Total Time spent with patient: 30 minutes  Past Psychiatric History: The patient reports she has been hospitalized 2 other times at [facility] behavioral health. She has also followed up for substance use with [facility] for treatment of substance use disorders. She reports that she stayed at [facility] 9 months. She has recently been prescribed and states she has been fairly compliant with the medications, Minipress for nightmares for the past 23 years, propranolol 10 mg by mouth 3 times a day for anxiety.  Is the patient at risk to self? Yes.    Has the patient been a risk to self in the past 6 months? Yes.    Has the patient been a risk to self within the distant past? Yes.    Is the patient a risk to others? No.  Has the patient been a risk to others in the past 6 months? No.  Has the patient been a risk to others within the distant past? No.   Prior Inpatient Therapy:   Prior Outpatient Therapy:    Alcohol Screening: 1. How often do you have a drink containing alcohol?: 4 or more times a week 2. How many drinks containing alcohol do you have on a typical day when you are drinking?: 10 or more 3. How often do you have six or more drinks on one occasion?: Daily or almost daily Preliminary Score: 8 4. How often during the last year have you found that you were not able to stop drinking once you had started?: Daily or almost  daily 5. How often during the last year have you failed to do what was normally expected from you becasue of drinking?: Daily or almost daily 6. How often during the last year have you needed a first drink in the morning to get yourself going after a heavy drinking session?: Daily  or almost daily 7. How often during the last year have you had a feeling of guilt of remorse after drinking?: Daily or almost daily 8. How often during the last year have you been unable to remember what happened the night before because you had been drinking?: Daily or almost daily 9. Have you or someone else been injured as a result of your drinking?: No 10. Has a relative or friend or a doctor or another health worker been concerned about your drinking or suggested you cut down?: Yes, during the last year Alcohol Use Disorder Identification Test Final Score (AUDIT): 36 Brief Intervention: Yes Substance Abuse History in the last 12 months:  Yes.   Consequences of Substance Abuse: Medical Consequences:  psychiatric admission Withdrawal Symptoms:   Diarrhea Nausea Tremors Previous Psychotropic Medications: Yes  Psychological Evaluations: Yes  Past Medical History:  Past Medical History:  Diagnosis Date  . Abscess of mouth   . Alcohol abuse   . Anxiety   . Benzodiazepine abuse   . Cocaine abuse   . Depression   . Hepatitis C   . Heroin abuse   . Hypertension   . 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 CESAREAN SECTION     Family History: History reviewed. No pertinent family history. Family Psychiatric  History: Mother passed away of cirrhosis of the liver. She reports that "all" her family have substance use issues. Tobacco Screening: Have you used any form of tobacco in the last 30 days? (Cigarettes, Smokeless Tobacco, Cigars, and/or Pipes): Yes Tobacco use, Select all that apply: 5 or more cigarettes per day Are you interested in Tobacco Cessation Medications?: Yes, will notify MD for an order Counseled patient on smoking cessation including recognizing danger situations, developing coping skills and basic information about quitting provided: Yes Social History:  History  Alcohol Use  . 50.4 oz/week  . 84 Cans of beer per week     Comment: 1 12-pack daiy     History  Drug Use  . 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                    Allergies:  No Known Allergies Lab Results:  Results for orders placed or performed during the hospital encounter of 04/09/16 (from the past 48 hour(s))  Rapid urine drug screen (hospital performed)     Status: Abnormal   Collection Time: 04/09/16  2:32 PM  Result Value Ref Range   Opiates NONE DETECTED NONE DETECTED   Cocaine POSITIVE (A) NONE DETECTED   Benzodiazepines POSITIVE (A) NONE DETECTED   Amphetamines NONE DETECTED NONE DETECTED   Tetrahydrocannabinol NONE DETECTED NONE DETECTED   Barbiturates NONE DETECTED NONE DETECTED    Comment:        DRUG SCREEN FOR MEDICAL PURPOSES ONLY.  IF CONFIRMATION IS NEEDED FOR ANY PURPOSE, NOTIFY LAB WITHIN 5 DAYS.  LOWEST DETECTABLE LIMITS FOR URINE DRUG SCREEN Drug Class       Cutoff (ng/mL) Amphetamine      1000 Barbiturate      200 Benzodiazepine   665 Tricyclics       993 Opiates          300 Cocaine          300 THC              50   Comprehensive metabolic panel     Status: Abnormal   Collection Time: 04/09/16  2:42 PM  Result Value Ref Range   Sodium 139 135 - 145 mmol/L   Potassium 4.2 3.5 - 5.1 mmol/L   Chloride 106 101 - 111 mmol/L   CO2 24 22 - 32 mmol/L   Glucose, Bld 111 (H) 65 - 99 mg/dL   BUN <5 (L) 6 - 20 mg/dL   Creatinine, Ser 0.72 0.44 - 1.00 mg/dL   Calcium 9.7 8.9 - 10.3 mg/dL   Total Protein 9.1 (H) 6.5 - 8.1 g/dL   Albumin 3.7 3.5 - 5.0 g/dL   AST 112 (H) 15 - 41 U/L   ALT 81 (H) 14 - 54 U/L   Alkaline Phosphatase 117 38 - 126 U/L   Total Bilirubin 0.9 0.3 - 1.2 mg/dL   GFR calc non Af Amer >60 >60 mL/min   GFR calc Af Amer >60 >60 mL/min     Comment: (NOTE) The eGFR has been calculated using the CKD EPI equation. This calculation has not been validated in all clinical situations. eGFR's persistently <60 mL/min signify possible Chronic Kidney Disease.    Anion gap 9 5 - 15  cbc     Status: None   Collection Time: 04/09/16  2:42 PM  Result Value Ref Range   WBC 8.0 4.0 - 10.5 K/uL   RBC 5.01 3.87 - 5.11 MIL/uL   Hemoglobin 14.8 12.0 - 15.0 g/dL   HCT 43.5 36.0 - 46.0 %   MCV 86.8 78.0 - 100.0 fL   MCH 29.5 26.0 - 34.0 pg   MCHC 34.0 30.0 - 36.0 g/dL   RDW 13.9 11.5 - 15.5 %   Platelets 312 150 - 400 K/uL  I-Stat beta hCG blood, ED     Status: None   Collection Time: 04/09/16  2:48 PM  Result Value Ref Range   I-stat hCG, quantitative <5.0 <5 mIU/mL   Comment 3            Comment:   GEST. AGE      CONC.  (mIU/mL)   <=1 WEEK        5 - 50     2 WEEKS       50 - 500     3 WEEKS       100 - 10,000     4 WEEKS     1,000 - 30,000        FEMALE AND NON-PREGNANT FEMALE:     LESS THAN 5 mIU/mL   Acetaminophen level     Status: None   Collection Time: 04/09/16  7:11 PM  Result Value Ref Range   Acetaminophen (Tylenol), Serum 16 10 - 30 ug/mL    Comment:        THERAPEUTIC CONCENTRATIONS VARY SIGNIFICANTLY. A RANGE OF 10-30 ug/mL MAY BE AN EFFECTIVE CONCENTRATION FOR MANY PATIENTS. HOWEVER, SOME ARE BEST TREATED AT CONCENTRATIONS OUTSIDE THIS RANGE. ACETAMINOPHEN CONCENTRATIONS >150 ug/mL AT 4 HOURS AFTER INGESTION AND >  50 ug/mL AT 12 HOURS AFTER INGESTION ARE OFTEN ASSOCIATED WITH TOXIC REACTIONS.   Salicylate level     Status: None   Collection Time: 04/09/16  7:11 PM  Result Value Ref Range   Salicylate Lvl <0.1 2.8 - 30.0 mg/dL  Ethanol     Status: None   Collection Time: 04/09/16  7:11 PM  Result Value Ref Range   Alcohol, Ethyl (B) <5 <5 mg/dL    Comment:        LOWEST DETECTABLE LIMIT FOR SERUM ALCOHOL IS 5 mg/dL FOR MEDICAL PURPOSES ONLY     Blood Alcohol level:  Lab Results  Component  Value Date   ETH <5 04/09/2016   ETH <5 65/53/7482    Metabolic Disorder Labs:  No results found for: HGBA1C, MPG No results found for: PROLACTIN No results found for: CHOL, TRIG, HDL, CHOLHDL, VLDL, LDLCALC  Current Medications: Current Facility-Administered Medications  Medication Dose Route Frequency Provider Last Rate Last Dose  . acetaminophen (TYLENOL) tablet 650 mg  650 mg Oral Q6H PRN Rozetta Nunnery, NP   650 mg at 04/11/16 0837  . alum & mag hydroxide-simeth (MAALOX/MYLANTA) 200-200-20 MG/5ML suspension 30 mL  30 mL Oral Q4H PRN Rozetta Nunnery, NP   30 mL at 04/10/16 2116  . chlordiazePOXIDE (LIBRIUM) capsule 25 mg  25 mg Oral Q6H PRN Linard Millers, MD      . chlordiazePOXIDE (LIBRIUM) capsule 25 mg  25 mg Oral QID Linard Millers, MD       Followed by  . [START ON 04/12/2016] chlordiazePOXIDE (LIBRIUM) capsule 25 mg  25 mg Oral TID Linard Millers, MD       Followed by  . [START ON 04/13/2016] chlordiazePOXIDE (LIBRIUM) capsule 25 mg  25 mg Oral BH-qamhs Linard Millers, MD       Followed by  . [START ON 04/14/2016] chlordiazePOXIDE (LIBRIUM) capsule 25 mg  25 mg Oral Daily Linard Millers, MD      . dicyclomine (BENTYL) tablet 20 mg  20 mg Oral Q6H PRN Linard Millers, MD      . gabapentin (NEURONTIN) capsule 300 mg  300 mg Oral TID Rozetta Nunnery, NP   300 mg at 04/11/16 0835  . hydrOXYzine (ATARAX/VISTARIL) tablet 25 mg  25 mg Oral Q6H PRN Rozetta Nunnery, NP   25 mg at 04/11/16 1036  . loperamide (IMODIUM) capsule 2-4 mg  2-4 mg Oral PRN Rozetta Nunnery, NP      . magnesium hydroxide (MILK OF MAGNESIA) suspension 30 mL  30 mL Oral Daily PRN Rozetta Nunnery, NP      . multivitamin with minerals tablet 1 tablet  1 tablet Oral Daily Rozetta Nunnery, NP   1 tablet at 04/11/16 0835  . nicotine (NICODERM CQ - dosed in mg/24 hours) patch 21 mg  21 mg Transdermal Daily PRN Rozetta Nunnery, NP      . nicotine (NICODERM CQ - dosed in mg/24 hours) patch 21 mg   21 mg Transdermal Daily Kerrie Buffalo, NP   21 mg at 04/11/16 0836  . ondansetron (ZOFRAN-ODT) disintegrating tablet 4 mg  4 mg Oral Q6H PRN Rozetta Nunnery, NP   4 mg at 04/11/16 0837  . PARoxetine (PAXIL) tablet 40 mg  40 mg Oral QHS Linard Millers, MD      . prazosin (MINIPRESS) capsule 1 mg  1 mg Oral QHS Rozetta Nunnery, NP   1 mg at  04/10/16 2116  . traZODone (DESYREL) tablet 50 mg  50 mg Oral QHS,MR X 1 Rozetta Nunnery, NP   50 mg at 04/10/16 2116   PTA Medications: Prescriptions Prior to Admission  Medication Sig Dispense Refill Last Dose  . doxepin (SINEQUAN) 25 MG capsule Take 25 mg by mouth at bedtime.   04/08/2016 at Unknown time  . gabapentin (NEURONTIN) 300 MG capsule Take 1 capsule (300 mg total) by mouth 2 (two) times daily. (Patient taking differently: Take 300 mg by mouth 3 (three) times daily. ) 60 capsule 0 04/08/2016 at Unknown time  . hydrOXYzine (ATARAX/VISTARIL) 25 MG tablet Take 1 tablet (25 mg total) by mouth 3 (three) times daily. 90 tablet 0 04/08/2016 at Unknown time  . PARoxetine (PAXIL) 30 MG tablet Take 30 mg by mouth daily.   04/08/2016 at Unknown time  . prazosin (MINIPRESS) 1 MG capsule Take 1 capsule (1 mg total) by mouth at bedtime. 30 capsule 0 04/08/2016 at Unknown time  . propranolol (INDERAL) 10 MG tablet Take 1 tablet (10 mg total) by mouth 3 (three) times daily. For anxiety 90 tablet 0 04/08/2016 at Unknown time  . albuterol (PROVENTIL HFA;VENTOLIN HFA) 108 (90 Base) MCG/ACT inhaler Inhale 2 puffs into the lungs every 4 (four) hours as needed for wheezing or shortness of breath. (Patient not taking: Reported on 04/11/2016) 1 Inhaler 1 Not Taking at Unknown time    Musculoskeletal: Strength & Muscle Tone: within normal limits Gait & Station: normal Patient leans: N/A  Psychiatric Specialty Exam: Physical Exam  Constitutional: She is oriented to person, place, and time. She appears well-developed and well-nourished.  HENT:  Head: Atraumatic.   Right Ear: External ear normal.  Eyes: Conjunctivae and EOM are normal. Pupils are equal, round, and reactive to light.  Neck: Normal range of motion.  Respiratory: Effort normal.  Musculoskeletal: Normal range of motion.  Neurological: She is alert and oriented to person, place, and time.  Psychiatric: Her behavior is normal.    Review of Systems  Constitutional: Positive for malaise/fatigue.  Gastrointestinal: Positive for nausea.  Musculoskeletal: Positive for myalgias.  Neurological: Positive for tingling.  erroer- tremors not tingling  Blood pressure 121/80, pulse 79, temperature 98.6 F (37 C), temperature source Oral, resp. rate 20, height '5\' 1"'  (1.549 m), weight 90.7 kg (200 lb), last menstrual period 11/27/2015.Body mass index is 37.79 kg/m.  General Appearance: Casual  Eye Contact:  Good  Speech:  Clear and Coherent  Volume:  Normal  Mood:  Dysphoric  Affect:  Appropriate and Congruent  Thought Process:  Coherent and Goal Directed  Orientation:  Full (Time, Place, and Person)  Thought Content:  Negative  Suicidal Thoughts:  Yes.  without intent/plan  Homicidal Thoughts:  No  Memory:  Negative  Judgement:  Fair  Insight:  Fair  Psychomotor Activity:  Normal  Concentration:  Concentration: Fair and Attention Span: Good  Recall:  [place] of Knowledge:  Good  Language:  NA  Akathisia:  NA  Handed:  Right  AIMS (if indicated):     Assets:  Physical Health Resilience  ADL's:  Intact  Cognition:  WNL  Sleep:  Number of Hours: 6.5    Treatment Plan Summary: Daily contact with patient to assess and evaluate symptoms and progress in treatment, Medication management and We will change to Librium detox and add clonidine protocol for detoxification from the [place] which approximately 5 days out should be still fairly prominent as methadone has a longer detox  tail then some other opiates. We will be continue to monitor her for safety as well.  Observation  Level/Precautions:  15 minute checks  Laboratory:  see labs  Psychotherapy:    Medications:    Consultations:    Discharge Concerns:    Estimated LOS:  Other:     Physician Treatment Plan for Primary Diagnosis: Opioid use disorder, severe, dependence ([place]) Long Term Goal(s): Improvement in symptoms so as ready for discharge  Short Term Goals: Ability to disclose and discuss suicidal ideas and Ability to demonstrate self-control will improve  Physician Treatment Plan for Secondary Diagnosis: Principal Problem:   Opioid use disorder, severe, dependence ([place]) Active Problems:   Substance induced mood disorder ([place])  Long Term Goal(s): Improvement in symptoms so as ready for discharge  Short Term Goals: Ability to identify changes in lifestyle to reduce recurrence of condition will improve, Ability to demonstrate self-control will improve, Ability to identify and develop effective coping behaviors will improve, Compliance with prescribed medications will improve and Ability to identify triggers associated with substance abuse/mental health issues will improve  I certify that inpatient services furnished can reasonably be expected to improve the patient's condition.    Linard Millers, MD 10/24/201711:56 AM

## 2016-04-11 NOTE — [facility] Group Notes (Signed)
[facility] Group Notes:  (Nursing/MHT/Case Management/Adjunct)  Date:  04/11/2016  Time:  0915  Type of Therapy:  Nurse Education  Participation Level:  Did Not Attend  Pt did not attend. Pt was invited.  [name], [name] 04/11/2016, 10:12 AM

## 2016-04-11 NOTE — Progress Notes (Signed)
Recreation Therapy Notes    Animal-Assisted Activity (AAA) Program Checklist/Progress Notes Patient Eligibility Criteria Checklist & Daily Group note for Rec TxIntervention  Date: 10.24.2017 Time: 2:45pm Location: 4 00 Hall Dayroom   AAA/T Program Assumption of Risk Form signed by Patient/ or Parent Legal Guardian Yes  Patient is free of allergies or sever asthma Yes  Patient reports no fear of animals Yes  Patient reports no history of cruelty to animals Yes  Patient understands his/her participation is voluntary Yes  Behavioral Response: Did not attend.   Marykay Lexenise L Bradlee Heitman, LRT/CTRS       Alberto Pina L 04/11/2016 3:01 PM

## 2016-04-11 NOTE — Tx Team (Signed)
Interdisciplinary Treatment and Diagnostic Plan Update  04/11/2016 Time of Session: 9:30AM MINKA KNIGHT MRN: [id number]  Principal Diagnosis: Opioid use disorder, severe, dependence (HCC)  Secondary Diagnoses: Principal Problem:   Opioid use disorder, severe, dependence (HCC) Active Problems:   Substance induced mood disorder (HCC)   Current Medications:  Current Facility-Administered Medications  Medication Dose Route Frequency Provider Last Rate Last Dose  . acetaminophen (TYLENOL) tablet 650 mg  650 mg Oral Q6H PRN Jackelyn Poling, NP   650 mg at 04/11/16 0837  . alum & mag hydroxide-simeth (MAALOX/MYLANTA) 200-200-20 MG/5ML suspension 30 mL  30 mL Oral Q4H PRN Jackelyn Poling, NP   30 mL at 04/10/16 2116  . chlordiazePOXIDE (LIBRIUM) capsule 25 mg  25 mg Oral Q6H PRN Acquanetta Sit, MD      . chlordiazePOXIDE (LIBRIUM) capsule 25 mg  25 mg Oral QID Acquanetta Sit, MD       Followed by  . [START ON 04/12/2016] chlordiazePOXIDE (LIBRIUM) capsule 25 mg  25 mg Oral TID Acquanetta Sit, MD       Followed by  . [START ON 04/13/2016] chlordiazePOXIDE (LIBRIUM) capsule 25 mg  25 mg Oral BH-qamhs Acquanetta Sit, MD       Followed by  . [START ON 04/14/2016] chlordiazePOXIDE (LIBRIUM) capsule 25 mg  25 mg Oral Daily Acquanetta Sit, MD      . dicyclomine (BENTYL) tablet 20 mg  20 mg Oral Q6H PRN Acquanetta Sit, MD      . gabapentin (NEURONTIN) capsule 300 mg  300 mg Oral TID Jackelyn Poling, NP   300 mg at 04/11/16 0835  . hydrOXYzine (ATARAX/VISTARIL) tablet 25 mg  25 mg Oral Q6H PRN Jackelyn Poling, NP   25 mg at 04/11/16 1036  . loperamide (IMODIUM) capsule 2-4 mg  2-4 mg Oral PRN Jackelyn Poling, NP      . magnesium hydroxide (MILK OF MAGNESIA) suspension 30 mL  30 mL Oral Daily PRN Jackelyn Poling, NP      . multivitamin with minerals tablet 1 tablet  1 tablet Oral Daily Jackelyn Poling, NP   1 tablet at 04/11/16 0835  . nicotine (NICODERM CQ - dosed in  mg/24 hours) patch 21 mg  21 mg Transdermal Daily PRN Jackelyn Poling, NP      . nicotine (NICODERM CQ - dosed in mg/24 hours) patch 21 mg  21 mg Transdermal Daily Adonis Brook, NP   21 mg at 04/11/16 0836  . ondansetron (ZOFRAN-ODT) disintegrating tablet 4 mg  4 mg Oral Q6H PRN Jackelyn Poling, NP   4 mg at 04/11/16 0837  . PARoxetine (PAXIL) tablet 40 mg  40 mg Oral QHS Acquanetta Sit, MD      . prazosin (MINIPRESS) capsule 1 mg  1 mg Oral QHS Jackelyn Poling, NP   1 mg at 04/10/16 2116  . traZODone (DESYREL) tablet 50 mg  50 mg Oral QHS,MR X 1 Jackelyn Poling, NP   50 mg at 04/10/16 2116   PTA Medications: Prescriptions Prior to Admission  Medication Sig Dispense Refill Last Dose  . doxepin (SINEQUAN) 25 MG capsule Take 25 mg by mouth at bedtime.   04/08/2016 at Unknown time  . gabapentin (NEURONTIN) 300 MG capsule Take 1 capsule (300 mg total) by mouth 2 (two) times daily. (Patient taking differently: Take 300 mg by mouth 3 (three) times daily. ) 60 capsule 0 04/08/2016 at Unknown time  .  hydrOXYzine (ATARAX/VISTARIL) 25 MG tablet Take 1 tablet (25 mg total) by mouth 3 (three) times daily. 90 tablet 0 04/08/2016 at Unknown time  . PARoxetine (PAXIL) 30 MG tablet Take 30 mg by mouth daily.   04/08/2016 at Unknown time  . prazosin (MINIPRESS) 1 MG capsule Take 1 capsule (1 mg total) by mouth at bedtime. 30 capsule 0 04/08/2016 at Unknown time  . propranolol (INDERAL) 10 MG tablet Take 1 tablet (10 mg total) by mouth 3 (three) times daily. For anxiety 90 tablet 0 04/08/2016 at Unknown time  . albuterol (PROVENTIL HFA;VENTOLIN HFA) 108 (90 Base) MCG/ACT inhaler Inhale 2 puffs into the lungs every 4 (four) hours as needed for wheezing or shortness of breath. (Patient not taking: Reported on 04/11/2016) 1 Inhaler 1 Not Taking at Unknown time    Patient Stressors: Financial difficulties Substance abuse  Patient Strengths: Ability for Warden/rangerinsight Communication skills Motivation for  treatment/growth Physical Health  Treatment Modalities: Medication Management, Group therapy, Case management,  1 to 1 session with clinician, Psychoeducation, Recreational therapy.   Physician Treatment Plan for Primary Diagnosis: Opioid use disorder, severe, dependence (HCC) Long Term Goal(s): Improvement in symptoms so as ready for discharge Improvement in symptoms so as ready for discharge   Short Term Goals: Ability to disclose and discuss suicidal ideas Ability to demonstrate self-control will improve Ability to identify changes in lifestyle to reduce recurrence of condition will improve Ability to demonstrate self-control will improve Ability to identify and develop effective coping behaviors will improve Compliance with prescribed medications will improve Ability to identify triggers associated with substance abuse/mental health issues will improve  Medication Management: Evaluate patient's response, side effects, and tolerance of medication regimen.  Therapeutic Interventions: 1 to 1 sessions, Unit Group sessions and Medication administration.  Evaluation of Outcomes: Progressing  Physician Treatment Plan for Secondary Diagnosis: Principal Problem:   Opioid use disorder, severe, dependence (HCC) Active Problems:   Substance induced mood disorder (HCC)  Long Term Goal(s): Improvement in symptoms so as ready for discharge Improvement in symptoms so as ready for discharge   Short Term Goals: Ability to disclose and discuss suicidal ideas Ability to demonstrate self-control will improve Ability to identify changes in lifestyle to reduce recurrence of condition will improve Ability to demonstrate self-control will improve Ability to identify and develop effective coping behaviors will improve Compliance with prescribed medications will improve Ability to identify triggers associated with substance abuse/mental health issues will improve     Medication Management: Evaluate  patient's response, side effects, and tolerance of medication regimen.  Therapeutic Interventions: 1 to 1 sessions, Unit Group sessions and Medication administration.  Evaluation of Outcomes: Progressing   RN Treatment Plan for Primary Diagnosis: Opioid use disorder, severe, dependence (HCC) Long Term Goal(s): Knowledge of disease and therapeutic regimen to maintain health will improve  Short Term Goals: Ability to remain free from injury will improve, Ability to disclose and discuss suicidal ideas and Ability to identify and develop effective coping behaviors will improve  Medication Management: RN will administer medications as ordered by provider, will assess and evaluate patient's response and provide education to patient for prescribed medication. RN will report any adverse and/or side effects to prescribing provider.  Therapeutic Interventions: 1 on 1 counseling sessions, Psychoeducation, Medication administration, Evaluate responses to treatment, Monitor vital signs and CBGs as ordered, Perform/monitor CIWA, COWS, AIMS and Fall Risk screenings as ordered, Perform wound care treatments as ordered.  Evaluation of Outcomes: Progressing   LCSW Treatment Plan for Primary Diagnosis:  Opioid use disorder, severe, dependence (HCC) Long Term Goal(s): Safe transition to appropriate next level of care at discharge, Engage patient in therapeutic group addressing interpersonal concerns.  Short Term Goals: Engage patient in aftercare planning with referrals and resources, Facilitate patient progression through stages of change regarding substance use diagnoses and concerns and Identify triggers associated with mental health/substance abuse issues  Therapeutic Interventions: Assess for all discharge needs, 1 to 1 time with Social worker, Explore available resources and support systems, Assess for adequacy in community support network, Educate family and significant other(s) on suicide prevention,  Complete Psychosocial Assessment, Interpersonal group therapy.  Evaluation of Outcomes: Progressing   Progress in Treatment: Attending groups: Yes. Participating in groups: Yes. Taking medication as prescribed: Yes. Toleration medication: Yes. Family/Significant other contact made: No, will contact:  with family if pt consents. Patient understands diagnosis: Yes. Discussing patient identified problems/goals with staff: Yes. Medical problems stabilized or resolved: Yes. Denies suicidal/homicidal ideation: Yes. Issues/concerns per patient self-inventory: No. Other: n/a  New problem(s) identified: No, Describe:  n/a  New Short Term/Long Term Goal(s):  Discharge Plan or Barriers: CSW assessing for appropriate referrals.   Reason for Continuation of Hospitalization: Depression Medication stabilization Withdrawal symptoms  Estimated Length of Stay: 3-5 days   Attendees: Patient: 04/11/2016 12:50 PM  Physician: Dr. Mckinley Jewel MD 04/11/2016 12:50 PM  Nursing: Zenovia Jarred RN 04/11/2016 12:50 PM  RN Care Manager: Onnie Boer CM 04/11/2016 12:50 PM  Social Worker: Trula Slade, LCSW 04/11/2016 12:50 PM  Recreational Therapist:  04/11/2016 12:50 PM  Other:  04/11/2016 12:50 PM  Other:  04/11/2016 12:50 PM  Other: 04/11/2016 12:50 PM    Scribe for Treatment Team: Ledell Peoples Smart, LCSW 04/11/2016 12:50 PM

## 2016-04-11 NOTE — Progress Notes (Signed)
Patient ID: Alyssa BeckmannLeigh N Steenbergen, female   DOB: 15-Feb-1971, 45 y.o.   MRN: [id number] D: Client visible on the unit, reports anxiety and depression "9" of 10, seen on phone and interacting with peers in a calm manner. A: Writer provided emotional support, reviewed medications, administered as ordered. Staff will monitor q5415min for safety. R: Client is safe on the unit, attended group.

## 2016-04-11 NOTE — [facility] Group Notes (Signed)
[facility] LCSW Group Therapy  04/11/2016 1:23 PM  Type of Therapy:  Group Therapy  Participation Level:  Did Not Attend-pt invited. Chose to remain in bed.  Summary of Progress/Problems: [facility] Speaker came to talk about his personal journey with substance abuse and addiction. The pt processed ways by which to relate to the speaker. [facility] speaker provided handouts and educational information pertaining to groups and services [facility].   Lometa Riggin N Smart LCSW 04/11/2016, 1:23 PM
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
## 2016-04-12 MED ORDER — CLONIDINE HCL 0.1 MG PO TABS
0.0500 mg | ORAL_TABLET | Freq: Two times a day (BID) | ORAL | Status: DC
  Administered 2016-04-12 – 2016-04-13 (×2): 0.05 mg via ORAL
  Filled 2016-04-12 (×6): qty 0.5

## 2016-04-12 NOTE — Plan of Care (Signed)
Problem: Safety: Goal: Ability to remain free from injury will improve Outcome: Progressing Client has remained free from injury AEB q7015min safety checks, assessment of s/s of withdrawals, client contracts for safety.

## 2016-04-12 NOTE — Progress Notes (Signed)
[facility] MD Progress Note  04/12/2016 12:37 PM  Patient Active Problem List   Diagnosis Date Noted  . Opioid use disorder, severe, dependence (HCC) 04/11/2016  . Substance induced mood disorder (HCC) 04/10/2016  . UTI (urinary tract infection) 12/07/2014  . Alcohol dependence with alcohol-induced mood disorder (HCC)   . Major depressive disorder, recurrent episode, severe (HCC) 12/04/2014  . Opioid use disorder, moderate, dependence (HCC) 12/04/2014  . Alcohol use disorder, severe, dependence (HCC) 11/12/2014  . Alcohol abuse 09/10/2013  . Cocaine abuse 09/10/2013    Diagnosis: Opiate use disorder  Subjective: Patient reports she still is having some detoxification symptoms. She would like to resume her usual Klonopin once her blood pressure can tolerate it. She denies any current acute suicidal ideation with plan to act although she does report chronic ongoing passive suicidal ideation, she denies homicidal ideation, plan or intent she denies any psychotic symptoms  Objective: Well-developed female in no apparent distress speech and motor within normal limits mood is "alright" affect is slightly anxious and dysphoric pleasant and cooperative speech and motor within normal limits thought processes linear and goal-directed thought content does endorse some ongoing suicidal ideations at times denies current plan or intent to act denies homicidal ideations, plan or intent denies psychosis. Alert and oriented 3 insight and judgment are fair to limited IQ appears an average range     Current Facility-Administered Medications (Cardiovascular):  .  cloNIDine (CATAPRES) tablet 0.05 mg .  prazosin (MINIPRESS) capsule 1 mg     Current Facility-Administered Medications (Analgesics):  .  acetaminophen (TYLENOL) tablet 650 mg     Current Facility-Administered Medications (Other):  .  alum & mag hydroxide-simeth (MAALOX/MYLANTA) 200-200-20 MG/5ML suspension 30 mL .  chlordiazePOXIDE (LIBRIUM)  capsule 25 mg .  [COMPLETED] chlordiazePOXIDE (LIBRIUM) capsule 25 mg **FOLLOWED BY** chlordiazePOXIDE (LIBRIUM) capsule 25 mg **FOLLOWED BY** [START ON 04/13/2016] chlordiazePOXIDE (LIBRIUM) capsule 25 mg **FOLLOWED BY** [START ON 04/14/2016] chlordiazePOXIDE (LIBRIUM) capsule 25 mg .  dicyclomine (BENTYL) tablet 20 mg .  gabapentin (NEURONTIN) capsule 300 mg .  hydrOXYzine (ATARAX/VISTARIL) tablet 25 mg .  loperamide (IMODIUM) capsule 2-4 mg .  magnesium hydroxide (MILK OF MAGNESIA) suspension 30 mL .  multivitamin with minerals tablet 1 tablet .  nicotine (NICODERM CQ - dosed in mg/24 hours) patch 21 mg .  nicotine (NICODERM CQ - dosed in mg/24 hours) patch 21 mg .  ondansetron (ZOFRAN-ODT) disintegrating tablet 4 mg .  PARoxetine (PAXIL) tablet 40 mg .  traZODone (DESYREL) tablet 50 mg  No current outpatient prescriptions on file.  Vital Signs:Blood pressure 117/74, pulse 68, temperature 97.9 F (36.6 C), temperature source Oral, resp. rate 18, height 5\' 1"  (1.549 m), weight 90.7 kg (200 lb), last menstrual period 11/27/2015.    Lab Results: No results found for this or any previous visit (from the past 48 hour(s)).  Physical Findings: AIMS: Facial and Oral Movements Muscles of Facial Expression: None, normal Lips and Perioral Area: None, normal Jaw: None, normal Tongue: None, normal,Extremity Movements Upper (arms, wrists, hands, fingers): None, normal Lower (legs, knees, ankles, toes): None, normal, Trunk Movements Neck, shoulders, hips: None, normal, Overall Severity Severity of abnormal movements (highest score from questions above): None, normal Incapacitation due to abnormal movements: None, normal Patient's awareness of abnormal movements (rate only patient's report): No Awareness, Dental Status Current problems with teeth and/or dentures?: No Does patient usually wear dentures?: No  CIWA:  CIWA-Ar Total: 1 COWS:  COWS Total Score: 4   Assessment/Plan: Patient's  blood  pressure has improved and we will begin Klonopin 18 at her half her usual dose giving her 0.05 mg twice a day and observe how patient tolerates.  Acquanetta SitElizabeth Woods Oates, MD 04/12/2016, 12:37 PM

## 2016-04-12 NOTE — Progress Notes (Addendum)
D: Alyssa Kelley reports passive SI but contracts for safety. She has been calm and appropriate and has been less isolative today. She reports multiple S&S of withdrawal, requesting PRNs when available. Her affect is somewhat brighter today. On her self inventory, she rated her depression 9, feelings of hopelessness 9, and anxiety 10, all out of 10. She reports aching all over.   A: Meds given as ordered, including multiple PRNs (see MAR). Q15 safety checks maintained. Support/encouragement offered.  R: Pt remains free from harm and continues with treatment. Will continue to monitor for needs/safety.

## 2016-04-12 NOTE — [facility] Group Notes (Signed)
[facility] LCSW Group Therapy  04/12/2016 3:51 PM  Type of Therapy:  Group Therapy  Participation Level:  Active  Participation Quality:  Attentive  Affect:  Appropriate  Cognitive:  Alert and Oriented  Insight:  Improving  Engagement in Therapy:  Improving  Modes of Intervention:  Discussion, Education, Exploration, Limit-setting, Problem-solving, Rapport Building, Socialization and Support  Summary of Progress/Problems: Today's Topic: Overcoming Obstacles. Patients identified one short term goal and potential obstacles in reaching this goal. Patients processed barriers involved in overcoming these obstacles. Patients identified steps necessary for overcoming these obstacles and explored motivation (internal and external) for facing these difficulties head on. Alyssa Kelley was attentive and engaged during today's processing group. She shared that she is hoping to get into [facility] but [facility] She discussed her concerns about relapse and limited family supports. She continues to show progress in the group setting with improving insight.   Jourdain Guay N Smart LCSW 04/12/2016, 3:51 PM

## 2016-04-12 NOTE — Progress Notes (Signed)
Recreation Therapy Notes  Date: 04/12/16 Time: 0930 Location: [address] Dayroom  Group Topic: Stress Management  Goal Area(s) Addresses:  Patient will verbalize importance of using healthy stress management.  Patient will identify positive emotions associated with healthy stress management.   Intervention: Stress Management  Activity :  Peaceful Place.  LRT introduced the stress management technique of guided imagery.  LRT read script to engage patients in the technique.  Patients were to follow along as LRT read script.     Education:  Stress Management, Discharge Planning.   Education Outcome: Acknowledges edcuation/In group clarification offered/Needs additional education  Clinical Observations/Feedback:  Pt did not attend group.   Caroll RancherMarjette Kayston Jodoin, LRT/CTRS         Caroll RancherLindsay, Markiyah Gahm A 04/12/2016 12:25 PM

## 2016-04-13 MED ORDER — ONDANSETRON 4 MG PO TBDP
4.0000 mg | ORAL_TABLET | Freq: Three times a day (TID) | ORAL | Status: DC | PRN
  Administered 2016-04-13 – 2016-04-19 (×4): 4 mg via ORAL
  Filled 2016-04-13 (×4): qty 1

## 2016-04-13 MED ORDER — LOPERAMIDE HCL 2 MG PO CAPS
4.0000 mg | ORAL_CAPSULE | Freq: Once | ORAL | Status: DC
  Filled 2016-04-13 (×2): qty 2

## 2016-04-13 MED ORDER — TRAZODONE HCL 100 MG PO TABS
100.0000 mg | ORAL_TABLET | Freq: Every evening | ORAL | Status: DC | PRN
  Administered 2016-04-13 – 2016-04-17 (×5): 100 mg via ORAL
  Filled 2016-04-13 (×2): qty 1
  Filled 2016-04-13: qty 14
  Filled 2016-04-13 (×2): qty 1

## 2016-04-13 MED ORDER — CLONIDINE HCL 0.1 MG PO TABS
0.1000 mg | ORAL_TABLET | Freq: Two times a day (BID) | ORAL | Status: DC
  Administered 2016-04-13 – 2016-04-19 (×12): 0.1 mg via ORAL
  Filled 2016-04-13 (×5): qty 1
  Filled 2016-04-13 (×3): qty 14
  Filled 2016-04-13 (×4): qty 1
  Filled 2016-04-13: qty 14
  Filled 2016-04-13 (×2): qty 1

## 2016-04-13 MED ORDER — LOPERAMIDE HCL 2 MG PO CAPS
2.0000 mg | ORAL_CAPSULE | Freq: Four times a day (QID) | ORAL | Status: DC | PRN
  Administered 2016-04-13: 2 mg via ORAL
  Filled 2016-04-13: qty 1

## 2016-04-13 NOTE — Progress Notes (Signed)
[facility] Progress Note  04/13/2016 1:09 PM      Patient Active Problem List   Diagnosis Date Noted  . Opioid use disorder, severe, dependence (HCC) 04/11/2016  . Substance induced mood disorder (HCC) 04/10/2016  . UTI (urinary tract infection) 12/07/2014  . Alcohol dependence with alcohol-induced mood disorder (HCC)   . Major depressive disorder, recurrent episode, severe (HCC) 12/04/2014  . Opioid use disorder, moderate, dependence (HCC) 12/04/2014  . Alcohol use disorder, severe, dependence (HCC) 11/12/2014  . Alcohol abuse 09/10/2013  . Cocaine abuse 09/10/2013    Diagnosis: Opiate use disorder  Subjective: Patient reports she still is having detoxification symptoms. She requested an increase of her Clonidine. She denies active thoughts or a plan for hurting herself at the moment, but she reports that she does have ongoing passive suicidal ideation which is worse now than at admission because she is feeling ill from the withdrawal. She denies homicidal ideation, plan, or intent.  Her mood is up and down, and she reports feelings of hopelessness, worthlessness, and anger. She does not report psychotic symptoms.  Pt requests an increase of her scheduled Clonidine for help with withdrawal sxs. She also states that she has not been sleeping well, and requests an increase in her nightly Trazodone. She also expresses considerable interest in being sent to [facility] for the BATTS program after discharge.   Objective: Pt is a WD/WN female in no acute distress who presents in casual clothing. Her speech, gait, and motor function are within normal limits. Pt's affect is slightly anxious, but she is pleasant and cooperative while conversing.  Thought processes are linear and thought content is goal-directed. She endorses ongoing suicidal ideations which have not improved since admission, but denies a current plan or intent to act on these ideations. Pt denies homicidal ideations, plan or  intent. Pt is alert and oriented 3. Insight and judgment are fair.     Current Facility-Administered Medications (Cardiovascular):  .  cloNIDine (CATAPRES) tablet 0.05 mg .  prazosin (MINIPRESS) capsule 1 mg     Current Facility-Administered Medications (Analgesics):  .  acetaminophen (TYLENOL) tablet 650 mg     Current Facility-Administered Medications (Other):  .  alum & mag hydroxide-simeth (MAALOX/MYLANTA) 200-200-20 MG/5ML suspension 30 mL .  chlordiazePOXIDE (LIBRIUM) capsule 25 mg .  [COMPLETED] chlordiazePOXIDE (LIBRIUM) capsule 25 mg **FOLLOWED BY** chlordiazePOXIDE (LIBRIUM) capsule 25 mg **FOLLOWED BY** [START ON 04/13/2016] chlordiazePOXIDE (LIBRIUM) capsule 25 mg **FOLLOWED BY** [START ON 04/14/2016] chlordiazePOXIDE (LIBRIUM) capsule 25 mg .  dicyclomine (BENTYL) tablet 20 mg .  gabapentin (NEURONTIN) capsule 300 mg .  hydrOXYzine (ATARAX/VISTARIL) tablet 25 mg .  loperamide (IMODIUM) capsule 2-4 mg .  magnesium hydroxide (MILK OF MAGNESIA) suspension 30 mL .  multivitamin with minerals tablet 1 tablet .  nicotine (NICODERM CQ - dosed in mg/24 hours) patch 21 mg .  nicotine (NICODERM CQ - dosed in mg/24 hours) patch 21 mg .  ondansetron (ZOFRAN-ODT) disintegrating tablet 4 mg .  PARoxetine (PAXIL) tablet 40 mg .  traZODone (DESYREL) tablet 50 mg  No current outpatient prescriptions on file.  Vital Signs: Blood pressure 117/82, pulse 76, temperature 97.7 F (36.5 C), temperature source Oral, resp. rate 16, height 5\' 1"  (1.549 m), weight 200 lb (90.7 kg), last menstrual period 11/27/2015.    Lab Results:  Lab Results Last 48 Hours  No results found for this or any previous visit (from the past 48 hour(s)).    Physical Findings: AIMS: Facial and Oral Movements Muscles of  Facial Expression: None, normal Lips and Perioral Area: None, normal Jaw: None, normal Tongue: None, normal,Extremity Movements Upper (arms, wrists, hands, fingers):  None, normal Lower (legs, knees, ankles, toes): None, normal, Trunk Movements Neck, shoulders, hips: None, normal, Overall Severity Severity of abnormal movements (highest score from questions above): None, normal Incapacitation due to abnormal movements: None, normal Patient's awareness of abnormal movements (rate only patient's report): No Awareness, Dental Status Current problems with teeth and/or dentures?: No Does patient usually wear dentures?: No  CIWA:  CIWA-Ar Total: 1 COWS:  COWS Total Score: 4   Assessment/Plan: Pt is tolerating Clonidine well, and her BP is stable and within normal limits. Will increase Clonidine dose back to 0.1mg , and initiate a trial of increasing Trazodone from 50mg  to 100mg  for sleep problems. Re-evaluate how patient tolerated medication changes tomorrow. Will speak to pt's CSW about her interest in BATTS program.  Continue to monitor patient for improvement of symptoms and for safety.   I have directly supervised the extender student and have had face-to-face contact with the patient today.

## 2016-04-13 NOTE — [facility] Group Notes (Signed)
[facility] LCSW Group Therapy  04/13/2016 4:09 PM  Type of Therapy:  Group Therapy  Participation Level:  Did Not Attend-pt invited. Chose to rest in room.   Summary of Progress/Problems: Emotion Regulation: This group focused on both positive and negative emotion identification and allowed group members to process ways to identify feelings, regulate negative emotions, and find healthy ways to manage internal/external emotions. Group members were asked to reflect on a time when their reaction to an emotion led to a negative outcome and explored how alternative responses using emotion regulation would have benefited them. Group members were also asked to discuss a time when emotion regulation was utilized when a negative emotion was experienced.   Andersen Iorio N Smart LCSW 04/13/2016, 4:09 PM

## 2016-04-13 NOTE — Progress Notes (Signed)
Pt has been visible in the milieu, with minimal interaction with other patients.  She still endorses passive SI, but contracts for safety.  She denies HI/AVH.  Pt reports having moderate withdrawal symptoms.  She seems mostly focused on medications and is at the med window frequently requesting prns.  She c/o diarrhea and was given imodium 4 mg.  She was instructed to inform writer if she had any more loose stools this evening.  Support and encouragement offered.  Discharge plans are in process.  Pt wants to discharge to a long term treatment facility.  Pt makes her needs known to staff.  Safety maintained with q15 minute checks.

## 2016-04-13 NOTE — Progress Notes (Addendum)
Data. Patient denies SI/HI/AVH.   Patient isolating in her room most of shift. Continues to C/O withdrawal symptoms, including abdominal cramping, nausea/vomiting, with one reported emesis, diarrhea, with one reported episode, and generalized pain. Affect blunt and patient reports depression and anxiety. She did not complete her self assessment. Action. PRNs for W/D symptoms, with relief. Emotional support and encouragement offered. Education provided on medication, indications and side effect. Q 15 minute checks done for safety. Response. Safety on the unit maintained through 15 minute checks.  Medications taken as prescribed.  Remained calm through out shift.

## 2016-04-13 NOTE — Plan of Care (Addendum)
Problem: Medication: Goal: Compliance with prescribed medication regimen will improve Outcome: Progressing Patient is taking medications appropriately.

## 2016-04-13 NOTE — [facility] Group Notes (Signed)
[facility] Group Notes:  (Nursing/MHT/Case Management/Adjunct)  Date:  04/13/2016  Time:  12:35 PM  Type of Therapy:  Nurse Education  Participation Level:  Did Not Attend   Alyssa Kelley 04/13/2016, 12:35 PM

## 2016-04-14 MED ORDER — GABAPENTIN 400 MG PO CAPS
400.0000 mg | ORAL_CAPSULE | Freq: Three times a day (TID) | ORAL | Status: DC
  Administered 2016-04-14 – 2016-04-15 (×3): 400 mg via ORAL
  Filled 2016-04-14 (×6): qty 1

## 2016-04-14 MED ORDER — HYDROXYZINE HCL 50 MG PO TABS
50.0000 mg | ORAL_TABLET | Freq: Three times a day (TID) | ORAL | Status: DC | PRN
  Administered 2016-04-14 – 2016-04-19 (×11): 50 mg via ORAL
  Filled 2016-04-14 (×8): qty 1
  Filled 2016-04-14: qty 20
  Filled 2016-04-14 (×3): qty 1

## 2016-04-14 NOTE — Tx Team (Signed)
Interdisciplinary Treatment and Diagnostic Plan Update  04/14/2016 Time of Session: 9:30AM Alyssa Kelley MRN: [id number]  Principal Diagnosis: Opioid use disorder, severe, dependence (HCC)  Secondary Diagnoses: Principal Problem:   Opioid use disorder, severe, dependence (HCC) Active Problems:   Substance induced mood disorder (HCC)   Current Medications:  Current Facility-Administered Medications  Medication Dose Route Frequency Provider Last Rate Last Dose  . acetaminophen (TYLENOL) tablet 650 mg  650 mg Oral Q6H PRN Jackelyn PolingJason A Berry, NP   650 mg at 04/14/16 0855  . alum & mag hydroxide-simeth (MAALOX/MYLANTA) 200-200-20 MG/5ML suspension 30 mL  30 mL Oral Q4H PRN Jackelyn PolingJason A Berry, NP   30 mL at 04/10/16 2116  . cloNIDine (CATAPRES) tablet 0.1 mg  0.1 mg Oral BID Acquanetta SitElizabeth Woods Oates, MD   0.1 mg at 04/14/16 0855  . dicyclomine (BENTYL) tablet 20 mg  20 mg Oral Q6H PRN Acquanetta SitElizabeth Woods Oates, MD   20 mg at 04/13/16 1718  . gabapentin (NEURONTIN) capsule 400 mg  400 mg Oral TID Acquanetta SitElizabeth Woods Oates, MD      . loperamide (IMODIUM) capsule 2 mg  2 mg Oral Q6H PRN Beau FannyJohn C Withrow, FNP   2 mg at 04/13/16 1829  . loperamide (IMODIUM) capsule 4 mg  4 mg Oral Once Beau FannyJohn C Withrow, FNP      . magnesium hydroxide (MILK OF MAGNESIA) suspension 30 mL  30 mL Oral Daily PRN Jackelyn PolingJason A Berry, NP      . multivitamin with minerals tablet 1 tablet  1 tablet Oral Daily Jackelyn PolingJason A Berry, NP   1 tablet at 04/14/16 [phone]  . nicotine (NICODERM CQ - dosed in mg/24 hours) patch 21 mg  21 mg Transdermal Daily PRN Jackelyn PolingJason A Berry, NP      . nicotine (NICODERM CQ - dosed in mg/24 hours) patch 21 mg  21 mg Transdermal Daily Adonis BrookSheila Agustin, NP   21 mg at 04/14/16 0853  . ondansetron (ZOFRAN-ODT) disintegrating tablet 4 mg  4 mg Oral Q8H PRN Beau FannyJohn C Withrow, FNP   4 mg at 04/13/16 1825  . PARoxetine (PAXIL) tablet 40 mg  40 mg Oral QHS Acquanetta SitElizabeth Woods Oates, MD   40 mg at 04/13/16 2141  . prazosin (MINIPRESS) capsule 1 mg  1 mg  Oral QHS Jackelyn PolingJason A Berry, NP   1 mg at 04/13/16 2141  . traZODone (DESYREL) tablet 100 mg  100 mg Oral QHS PRN Acquanetta SitElizabeth Woods Oates, MD   100 mg at 04/13/16 2140   PTA Medications: Prescriptions Prior to Admission  Medication Sig Dispense Refill Last Dose  . doxepin (SINEQUAN) 25 MG capsule Take 25 mg by mouth at bedtime.   04/08/2016 at Unknown time  . gabapentin (NEURONTIN) 300 MG capsule Take 1 capsule (300 mg total) by mouth 2 (two) times daily. (Patient taking differently: Take 300 mg by mouth 3 (three) times daily. ) 60 capsule 0 04/08/2016 at Unknown time  . hydrOXYzine (ATARAX/VISTARIL) 25 MG tablet Take 1 tablet (25 mg total) by mouth 3 (three) times daily. 90 tablet 0 04/08/2016 at Unknown time  . PARoxetine (PAXIL) 30 MG tablet Take 30 mg by mouth daily.   04/08/2016 at Unknown time  . prazosin (MINIPRESS) 1 MG capsule Take 1 capsule (1 mg total) by mouth at bedtime. 30 capsule 0 04/08/2016 at Unknown time  . propranolol (INDERAL) 10 MG tablet Take 1 tablet (10 mg total) by mouth 3 (three) times daily. For anxiety 90 tablet 0 04/08/2016 at Unknown time  .  albuterol (PROVENTIL HFA;VENTOLIN HFA) 108 (90 Base) MCG/ACT inhaler Inhale 2 puffs into the lungs every 4 (four) hours as needed for wheezing or shortness of breath. (Patient not taking: Reported on 04/11/2016) 1 Inhaler 1 Not Taking at Unknown time    Patient Stressors: Financial difficulties Substance abuse  Patient Strengths: Ability for Warden/ranger for treatment/growth Physical Health  Treatment Modalities: Medication Management, Group therapy, Case management,  1 to 1 session with clinician, Psychoeducation, Recreational therapy.   Physician Treatment Plan for Primary Diagnosis: Opioid use disorder, severe, dependence (HCC) Long Term Goal(s): Improvement in symptoms so as ready for discharge Improvement in symptoms so as ready for discharge   Short Term Goals: Ability to disclose and discuss  suicidal ideas Ability to demonstrate self-control will improve Ability to identify changes in lifestyle to reduce recurrence of condition will improve Ability to demonstrate self-control will improve Ability to identify and develop effective coping behaviors will improve Compliance with prescribed medications will improve Ability to identify triggers associated with substance abuse/mental health issues will improve  Medication Management: Evaluate patient's response, side effects, and tolerance of medication regimen.  Therapeutic Interventions: 1 to 1 sessions, Unit Group sessions and Medication administration.  Evaluation of Outcomes: Progressing  Physician Treatment Plan for Secondary Diagnosis: Principal Problem:   Opioid use disorder, severe, dependence (HCC) Active Problems:   Substance induced mood disorder (HCC)  Long Term Goal(s): Improvement in symptoms so as ready for discharge Improvement in symptoms so as ready for discharge   Short Term Goals: Ability to disclose and discuss suicidal ideas Ability to demonstrate self-control will improve Ability to identify changes in lifestyle to reduce recurrence of condition will improve Ability to demonstrate self-control will improve Ability to identify and develop effective coping behaviors will improve Compliance with prescribed medications will improve Ability to identify triggers associated with substance abuse/mental health issues will improve     Medication Management: Evaluate patient's response, side effects, and tolerance of medication regimen.  Therapeutic Interventions: 1 to 1 sessions, Unit Group sessions and Medication administration.  Evaluation of Outcomes: Progressing   RN Treatment Plan for Primary Diagnosis: Opioid use disorder, severe, dependence (HCC) Long Term Goal(s): Knowledge of disease and therapeutic regimen to maintain health will improve  Short Term Goals: Ability to remain free from injury will  improve, Ability to disclose and discuss suicidal ideas and Ability to identify and develop effective coping behaviors will improve  Medication Management: RN will administer medications as ordered by provider, will assess and evaluate patient's response and provide education to patient for prescribed medication. RN will report any adverse and/or side effects to prescribing provider.  Therapeutic Interventions: 1 on 1 counseling sessions, Psychoeducation, Medication administration, Evaluate responses to treatment, Monitor vital signs and CBGs as ordered, Perform/monitor CIWA, COWS, AIMS and Fall Risk screenings as ordered, Perform wound care treatments as ordered.  Evaluation of Outcomes: Progressing   LCSW Treatment Plan for Primary Diagnosis: Opioid use disorder, severe, dependence (HCC) Long Term Goal(s): Safe transition to appropriate next level of care at discharge, Engage patient in therapeutic group addressing interpersonal concerns.  Short Term Goals: Engage patient in aftercare planning with referrals and resources, Facilitate patient progression through stages of change regarding substance use diagnoses and concerns and Identify triggers associated with mental health/substance abuse issues  Therapeutic Interventions: Assess for all discharge needs, 1 to 1 time with Social worker, Explore available resources and support systems, Assess for adequacy in community support network, Educate family and significant other(s) on suicide  prevention, Complete Psychosocial Assessment, Interpersonal group therapy.  Evaluation of Outcomes: Progressing   Progress in Treatment: Attending groups: Yes. Participating in groups: Yes. Taking medication as prescribed: Yes. Toleration medication: Yes. Family/Significant other contact made: Contact attempts made with pt's aunt Letitia Neri [phone]. SPE completed with pt.  Patient understands diagnosis: Yes. Discussing patient identified  problems/goals with staff: Yes. Medical problems stabilized or resolved: Yes. Denies suicidal/homicidal ideation: Yes. Issues/concerns per patient self-inventory: No. Other: n/a  New problem(s) identified: No, Describe:  n/a  New Short Term/Long Term Goal(s):  Discharge Plan or Barriers: Pt has daymark screening scheduled for Monday at 11:45AM. She has been referred to [facility] as well--no beds available at this time.   Reason for Continuation of Hospitalization: Depression Medication stabilization Withdrawal symptoms  Estimated Length of Stay: 2-3 days (discharge tentative for Monday).   Attendees: Patient: 04/14/2016 11:23 AM  Physician: Dr. Mckinley Jewel MD 04/14/2016 11:23 AM  Nursing: Sharalyn Ink RN 04/14/2016 11:23 AM  RN Care Manager: Onnie Boer CM 04/14/2016 11:23 AM  Social Worker: Trula Slade, LCSW 04/14/2016 11:23 AM  Recreational Therapist:  04/14/2016 11:23 AM  Other:  04/14/2016 11:23 AM  Other:  04/14/2016 11:23 AM  Other: 04/14/2016 11:23 AM    Scribe for Treatment Team: Ledell Peoples Smart, LCSW 04/14/2016 11:23 AM

## 2016-04-14 NOTE — [facility] Suicide Risk Assessment (Signed)
[facility] INPATIENT:  Family/Significant Other Suicide Prevention Education  Suicide Prevention Education:  Education Completed; Alyssa Kelley (pt's aunt) [phone] has been identified by the patient as the family member/significant other with whom the patient will be residing, and identified as the person(s) who will aid the patient in the event of a mental health crisis (suicidal ideations/suicide attempt).  With written consent from the patient, the family member/significant other has been provided the following suicide prevention education, prior to the and/or following the discharge of the patient.  The suicide prevention education provided includes the following:  Suicide risk factors  Suicide prevention and interventions  National Suicide Hotline telephone number  [facility] assessment telephone number  [facility] 911  [facility] and/or [facility]idential Mobile Crisis Unit telephone number  Request made of family/significant other to:  Remove weapons (e.g., guns, rifles, knives), all items previously/currently identified as safety concern.    Remove drugs/medications (over-the-counter, prescriptions, illicit drugs), all items previously/currently identified as a safety concern.  The family member/significant other verbalizes understanding of the suicide prevention education information provided.  The family member/significant other agrees to remove the items of safety concern listed above.  Alyssa Kelley N Smart LCSW 04/14/2016, 11:28 AM

## 2016-04-14 NOTE — [facility] Group Notes (Signed)
[facility] LCSW Group Therapy  04/14/2016 3:09 PM  Type of Therapy:  Group Therapy  Participation Level:  Did Not Attend-pt invited. Chose to rest in room.   Summary of Progress/Problems: Feelings around Relapse. Group members discussed the meaning of relapse and shared personal stories of relapse, how it affected them and others, and how they perceived themselves during this time. Group members were encouraged to identify triggers, warning signs and coping skills used when facing the possibility of relapse. Social supports were discussed and explored in detail.  Nissan Frazzini N Smart LCSW 04/14/2016, 3:09 PM

## 2016-04-14 NOTE — Progress Notes (Signed)
Recreation Therapy Notes  Date: 04/14/16 Time: 0930 Location: 300 Hall Dayroom  Group Topic: Stress Management  Goal Area(s) Addresses:  Patient will verbalize importance of using healthy stress management.  Patient will identify positive emotions associated with healthy stress management.   Behavioral Response: Engaged  Intervention: Stress Management  Activity :  [place].  LRT introduced the stress management technique of guided imagery.  LRT read a script to engage patients in the technique.  Patients were to follow along as LRT read the script.  Education:  Stress Management, Discharge Planning.   Education Outcome: Acknowledges edcuation/In group clarification offered/Needs additional education  Clinical Observations/Feedback: Pt attended group.    Ashe Gago, LRT/CTRS         Sherry Rogus A 04/14/2016 11:25 AM 

## 2016-04-14 NOTE — Progress Notes (Signed)
[facility]  04/14/2016 11:58 AM  Patient Active Problem List   Diagnosis Date Noted  . Opioid use disorder, severe, dependence (HCC) 04/11/2016  . Substance induced mood disorder (HCC) 04/10/2016  . UTI (urinary tract infection) 12/07/2014  . Alcohol dependence with alcohol-induced mood disorder (HCC)   . Major depressive disorder, recurrent episode, severe (HCC) 12/04/2014  . Opioid use disorder, moderate, dependence (HCC) 12/04/2014  . Alcohol use disorder, severe, dependence (HCC) 11/12/2014  . Alcohol abuse 09/10/2013  . Cocaine abuse 09/10/2013    Diagnosis: Opiate use disorder, depression  Subjective: Patient has had some somatic complaints and reports she is still somewhat depressed especially about the future with some passive suicidal ideation. She denies any current intent or plan to act however. We discussed increasing her gabapentin to help with her mood and anxiety and she is in agreement with this plan. She is having no side effects from her current dose.  Objective: Well developed well nourished female who does appear a bit anxious and dysphoric but in no apparent distress speech and motor within normal limits casually groomed, pleasant and appropriate thought processes are linear and goal directed, thought content endorses some chronic passive SI without plan to act, mood is described as a little bit down today and affect is congruent alert and oriented 3 IQ appears an average range insight and judgment are fair     Current Facility-Administered Medications (Cardiovascular):  .  cloNIDine (CATAPRES) tablet 0.1 mg .  prazosin (MINIPRESS) capsule 1 mg     Current Facility-Administered Medications (Analgesics):  .  acetaminophen (TYLENOL) tablet 650 mg     Current Facility-Administered Medications (Other):  .  alum & mag hydroxide-simeth (MAALOX/MYLANTA) 200-200-20 MG/5ML suspension 30 mL .  dicyclomine (BENTYL) tablet 20 mg .  gabapentin (NEURONTIN)  capsule 400 mg .  loperamide (IMODIUM) capsule 2 mg .  loperamide (IMODIUM) capsule 4 mg .  magnesium hydroxide (MILK OF MAGNESIA) suspension 30 mL .  multivitamin with minerals tablet 1 tablet .  nicotine (NICODERM CQ - dosed in mg/24 hours) patch 21 mg .  nicotine (NICODERM CQ - dosed in mg/24 hours) patch 21 mg .  ondansetron (ZOFRAN-ODT) disintegrating tablet 4 mg .  PARoxetine (PAXIL) tablet 40 mg .  traZODone (DESYREL) tablet 100 mg  No current outpatient prescriptions on file.  Vital Signs:Blood pressure (!) 126/95, pulse 64, temperature 98.2 F (36.8 C), resp. rate 16, height 5\' 1"  (1.549 m), weight 90.7 kg (200 lb), last menstrual period 11/27/2015.    Lab Results: No results found for this or any previous visit (from the past 48 hour(s)).  Physical Findings: AIMS: Facial and Oral Movements Muscles of Facial Expression: None, normal Lips and Perioral Area: None, normal Jaw: None, normal Tongue: None, normal,Extremity Movements Upper (arms, wrists, hands, fingers): None, normal Lower (legs, knees, ankles, toes): None, normal, Trunk Movements Neck, shoulders, hips: None, normal, Overall Severity Severity of abnormal movements (highest score from questions above): None, normal Incapacitation due to abnormal movements: None, normal Patient's awareness of abnormal movements (rate only patient's report): No Awareness, Dental Status Current problems with teeth and/or dentures?: No Does patient usually wear dentures?: No  CIWA:  CIWA-Ar Total: 0 COWS:  COWS Total Score: 5   Assessment/Plan: Patient continues to exhibit some anxiety about her future plans and coming off of substances. We will increase her gabapentin to 400 mg by mouth 3 times a day and observe if it is helpful. Patient has been trying to arrange some  follow-up and placement with social work for long-term treatment of her issues.  Alyssa Sit, MD 04/14/2016, 11:58 AM

## 2016-04-14 NOTE — Progress Notes (Signed)
Pt has been out in the milieu this evening, observed in the dayroom interacting with peers.  Pt continues to c/o moderate withdrawal symptoms, mostly stomach issues.  She requested zofran, but was informed that it was too early.  When she was given her hs meds, she asked where her clonidine was.  Pt was informed that she had already received two doses today and would receive two tomorrow.  Pt also asked about Tylenol, but it was also too early.  She was given a heat pack as requested.  Pt denies SI/HI/AVH at this time.  She is interested in long term treatment for her drug use and referrals are being made per notes in pt's chart.  Of note this evening, while doing environmental checks, pt was found to have a lighter and part of a cigarette in her belongings.  MHT had reported earlier smelling cigarette smoke.  Writer and [facility] spoke with pt and reminded her of the treatment agreement she signed at the beginning of her admission.  She denied having anymore cigarettes in her possession.  Support and encouragement offered.  Pt attended evening karaoke group.  Discharge plans are in process.  Safety maintained with q15 minute checks.

## 2016-04-14 NOTE — Progress Notes (Signed)
Alyssa Kelley is quiet, she sleeps in her bed most of the day and then when she's not in the bed, she usually sits in the dayroom.... She is quiet and darts around the unit...watching. She saqys  "  I have  awful " feelings of anxiety. She says " it never goes away.Marland Kitchen.Marland Kitchen.I don't know how to deal with it". She does not demonstrate any resolve to deal with these feelings and says " I just need more.Marland Kitchen.ibuprofen need more.. need another vistaril"  A She does complete her daily assessment and on it she writes she she has experienced SI today and she is willing to contract for safety  With this wirter. R Safety is in place and poc cont.

## 2016-04-15 DIAGNOSIS — R45851 Suicidal ideations: Secondary | ICD-10-CM

## 2016-04-15 DIAGNOSIS — F17210 Nicotine dependence, cigarettes, uncomplicated: Secondary | ICD-10-CM

## 2016-04-15 DIAGNOSIS — F1120 Opioid dependence, uncomplicated: Secondary | ICD-10-CM

## 2016-04-15 DIAGNOSIS — Z79899 Other long term (current) drug therapy: Secondary | ICD-10-CM

## 2016-04-15 MED ORDER — GABAPENTIN 400 MG PO CAPS
500.0000 mg | ORAL_CAPSULE | Freq: Three times a day (TID) | ORAL | Status: DC
  Administered 2016-04-15 – 2016-04-16 (×4): 500 mg via ORAL
  Filled 2016-04-15 (×9): qty 1

## 2016-04-15 NOTE — [facility] Group Notes (Signed)
[facility] Group Notes:  (Nursing/MHT/Case Management/Adjunct)  Date:  04/15/2016  Time:  5:50 PM  Type of Therapy:  Nurse Education  Participation Level:  Active  Participation Quality:  Appropriate  Affect:  Appropriate  Cognitive:  Appropriate  Insight:  Appropriate  Engagement in Group:  Engaged  Modes of Intervention:  Education and Problem-solving  Summary of Progress/Problems: Patient was able to identify five positive personal attributes.  Almira Barenny G Alyson Ki 04/15/2016, 5:50 PM

## 2016-04-15 NOTE — Progress Notes (Signed)
[facility] MD Progress Note  04/15/2016 10:33 AM Alyssa Kelley  MRN:  [id number] Subjective:   Patient seen, chart reviewed and case discussed with nursing staff. No significant overnight event.   Patient states that she came here, feeling overwhelmed after tapering off methadone. She used cocaine, drank twelve pack of beer, benzo (Xanax, 40 mg) and heroin, as she "did not care. " Patient states that she wants to get back on propranolol, which she used to take for years before admission. She feels that her tremor is worse. She has some nausea, muscle pain, but reports improvement in palpitation, diaphoresis since admission. She endorses insomnia. She continues to have passive SI. She denies AH/VH.   Principal Problem: Opioid use disorder, severe, dependence (HCC) Diagnosis:   Patient Active Problem List   Diagnosis Date Noted  . Opioid use disorder, severe, dependence (HCC) [F11.20] 04/11/2016  . Substance induced mood disorder (HCC) [F19.94] 04/10/2016  . UTI (urinary tract infection) [N39.0] 12/07/2014  . Alcohol dependence with alcohol-induced mood disorder (HCC) [F10.24]   . Major depressive disorder, recurrent episode, severe (HCC) [F33.2] 12/04/2014  . Opioid use disorder, moderate, dependence (HCC) [F11.20] 12/04/2014  . Alcohol use disorder, severe, dependence (HCC) [F10.20] 11/12/2014  . Alcohol abuse [F10.10] 09/10/2013  . Cocaine abuse [F14.10] 09/10/2013   Total Time spent with patient: 30 minutes  Past Psychiatric History: see HPI  Past Medical History:  Past Medical History:  Diagnosis Date  . Abscess of mouth   . Alcohol abuse   . Anxiety   . Benzodiazepine abuse   . Cocaine abuse   . Depression   . Hepatitis C   . Heroin abuse   . Hypertension   . 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 CESAREAN SECTION     Family History: History reviewed. No pertinent family history. Family Psychiatric  History: see HPI Social History:   History  Alcohol Use  . 50.4 oz/week  . 84 Cans of beer per week    Comment: 1 12-pack daiy     History  Drug Use  . Types: Cocaine, Oxycodone, Heroin, Benzodiazepines    Comment: Heroin, benzos    Social History   Social History  . Marital status: Single    Spouse name: N/A  . Number of children: N/A  . Years of education: N/A   Social History Main Topics  . Smoking status: Current Every Day Smoker    Packs/day: 1.00    Years: 15.00    Types: Cigarettes  . Smokeless tobacco: Never Used  . Alcohol use 50.4 oz/week    84 Cans of beer per week     Comment: 1 12-pack daiy  . Drug use:     Types: Cocaine, Oxycodone, Heroin, Benzodiazepines     Comment: Heroin, benzos  . Sexual activity: Yes    Birth control/ protection: None   Other Topics Concern  . None   Social History Narrative  . None   Additional Social History:                         Sleep: Poor  Appetite:  Fair  Current Medications: Current Facility-Administered Medications  Medication Dose Route Frequency Provider Last Rate Last Dose  . acetaminophen (TYLENOL) tablet 650 mg  650 mg Oral Q6H PRN Jackelyn Poling, NP   650 mg at 04/15/16 0958  . alum & mag hydroxide-simeth (MAALOX/MYLANTA) 200-200-20 MG/5ML suspension 30  mL  30 mL Oral Q4H PRN Jackelyn PolingJason A Berry, NP   30 mL at 04/10/16 2116  . cloNIDine (CATAPRES) tablet 0.1 mg  0.1 mg Oral BID Acquanetta SitElizabeth Woods Oates, MD   0.1 mg at 04/14/16 1833  . dicyclomine (BENTYL) tablet 20 mg  20 mg Oral Q6H PRN Acquanetta SitElizabeth Woods Oates, MD   20 mg at 04/13/16 1718  . gabapentin (NEURONTIN) capsule 400 mg  400 mg Oral TID Acquanetta SitElizabeth Woods Oates, MD   400 mg at 04/15/16 0818  . hydrOXYzine (ATARAX/VISTARIL) tablet 50 mg  50 mg Oral TID PRN Acquanetta SitElizabeth Woods Oates, MD   50 mg at 04/15/16 0956  . loperamide (IMODIUM) capsule 2 mg  2 mg Oral Q6H PRN Beau FannyJohn C Withrow, FNP   2 mg at 04/13/16 1829  . loperamide (IMODIUM) capsule 4 mg  4 mg Oral Once Beau FannyJohn C Withrow, FNP      .  magnesium hydroxide (MILK OF MAGNESIA) suspension 30 mL  30 mL Oral Daily PRN Jackelyn PolingJason A Berry, NP      . multivitamin with minerals tablet 1 tablet  1 tablet Oral Daily Jackelyn PolingJason A Berry, NP   1 tablet at 04/15/16 0818  . nicotine (NICODERM CQ - dosed in mg/24 hours) patch 21 mg  21 mg Transdermal Daily PRN Jackelyn PolingJason A Berry, NP      . nicotine (NICODERM CQ - dosed in mg/24 hours) patch 21 mg  21 mg Transdermal Daily Adonis BrookSheila Agustin, NP   21 mg at 04/15/16 0819  . ondansetron (ZOFRAN-ODT) disintegrating tablet 4 mg  4 mg Oral Q8H PRN Beau FannyJohn C Withrow, FNP   4 mg at 04/13/16 1825  . PARoxetine (PAXIL) tablet 40 mg  40 mg Oral QHS Acquanetta SitElizabeth Woods Oates, MD   40 mg at 04/14/16 2144  . prazosin (MINIPRESS) capsule 1 mg  1 mg Oral QHS Jackelyn PolingJason A Berry, NP   1 mg at 04/14/16 2144  . traZODone (DESYREL) tablet 100 mg  100 mg Oral QHS PRN Acquanetta SitElizabeth Woods Oates, MD   100 mg at 04/14/16 2145    Lab Results: No results found for this or any previous visit (from the past 48 hour(s)).  Blood Alcohol level:  Lab Results  Component Value Date   ETH <5 04/09/2016   ETH <5 12/04/2014    Metabolic Disorder Labs: No results found for: HGBA1C, MPG No results found for: PROLACTIN No results found for: CHOL, TRIG, HDL, CHOLHDL, VLDL, LDLCALC  Physical Findings: AIMS: Facial and Oral Movements Muscles of Facial Expression: None, normal Lips and Perioral Area: None, normal Jaw: None, normal Tongue: None, normal,Extremity Movements Upper (arms, wrists, hands, fingers): None, normal Lower (legs, knees, ankles, toes): None, normal, Trunk Movements Neck, shoulders, hips: None, normal, Overall Severity Severity of abnormal movements (highest score from questions above): None, normal Incapacitation due to abnormal movements: None, normal Patient's awareness of abnormal movements (rate only patient's report): No Awareness, Dental Status Current problems with teeth and/or dentures?: No Does patient usually wear dentures?: No   CIWA:  CIWA-Ar Total: 0 COWS:  COWS Total Score: 3  Musculoskeletal: Strength & Muscle Tone: within normal limits Gait & Station: normal Patient leans: N/A  Psychiatric Specialty Exam: Physical Exam  Review of Systems  Constitutional: Negative for diaphoresis.  Respiratory: Negative for shortness of breath.   Cardiovascular: Negative for chest pain and palpitations.  Gastrointestinal: Positive for nausea.  Neurological: Positive for tremors.  Psychiatric/Behavioral: Positive for depression, substance abuse and suicidal ideas. Negative for hallucinations. The  patient is nervous/anxious and has insomnia.   All other systems reviewed and are negative.   Blood pressure (!) 84/50, pulse 78, temperature 97.9 F (36.6 C), temperature source Oral, resp. rate 18, height 5\' 1"  (1.549 m), weight 200 lb (90.7 kg), last menstrual period 11/27/2015.Body mass index is 37.79 kg/m.  General Appearance: Fairly Groomed  Eye Contact:  Good  Speech:  Clear and Coherent  Volume:  Normal  Mood:  Anxious  Affect:  Restricted and anxious  Thought Process:  Coherent and Goal Directed  Orientation:  Full (Time, Place, and Person)  Thought Content:  Logical Perceptions: denies AH/VH  Suicidal Thoughts:  Yes.  without intent/plan  Homicidal Thoughts:  No  Memory:  Immediate;   Good Recent;   Good Remote;   Good  Judgement:  Fair  Insight:  Fair  Psychomotor Activity:  Normal  Concentration:  Concentration: Good and Attention Span: Good  Recall:  Good  Fund of Knowledge:  Good  Language:  Good  Akathisia:  NA  Handed:  Right  AIMS (if indicated):     Assets:  Communication Skills Desire for Improvement  ADL's:  Intact  Cognition:  WNL  Sleep:  Number of Hours: 6.75   Assessment Wynelle BeckmannLeigh N Wambolt is a 45 year old female with opioid, alcohol, cocaine, benzodiazepine use disorder, depression who presented with SI in the setting of being discharged from methadone clinic after she used  benzodiazepine.   # Opioid withdrawal # Alcohol/benzodiazepine withdrawal On clonidine protocol. Patient completed on alcohol detox. Patient continues to have symptoms consistent with opioid withdrawal. Noted that patient has  marginal hypotension on average. Will continue to hold propranolol (home meds) for her tremors at this time; patient does not have other significant signs consistent with alcohol/benzo withdrawal symptoms.   # MDD # r/o substance use disorder Patient continues to endorse neurovegetative symptoms and passive SI. Patient agrees to talk with staff if SI were to worsen. Continue Paxil to target her mood. Will uptitrate gabapentin to target her anxiety/insomnia.   Plan - Continue clonidine protocol, supportive management - Continue to hold metoprolol - Increase gabapentin 500 mg TID - Continue Paxil 40 mg daily - Continue prazosin 1 mg qhs - Continue Trazodone 100 mg qhsprn for insomnia  Treatment Plan Summary: Daily contact with patient to assess and evaluate symptoms and progress in treatment  Neysa Hottereina Broderic Bara, MD 04/15/2016, 10:33 AM

## 2016-04-15 NOTE — [facility] Group Notes (Signed)
  [facility] LCSW Group Therapy Note  04/15/2016  and  10:00 AM  Type of Therapy and Topic:  Group Therapy: Avoiding Self-Sabotaging and Enabling Behaviors  Participation Level:  Minimal  Participation Quality:  Attentive while present  Affect:  Flat  Cognitive:  Oriented  Insight:  None shared  Engagement in Therapy:  Limited   Therapeutic models used: Cognitive Behavioral Therapy,  Person-Centered Therapy and Motivational Interviewing  Modes of Intervention:  Discussion, Exploration, Orientation, Rapport Building, Socialization and Support   Summary of Progress/Problems:  The main focus of today's process group was for the patient to identify ways in which they have in the past sabotaged their own recovery. Motivational Interviewing was utilized to identify motivation they may have for wanting to change. The Stages of Change were explained using a handout, and patients identified where they currently are with regard to stages of change. Patient was attentive during introductions yet left the group after 15 minutes.    Alyssa Bernatherine C Harrill, LCSW

## 2016-04-15 NOTE — Progress Notes (Signed)
Pt continues to be focused on taking medication.  She reports that she is still feeling anxious and frequently asks for random medications.  At med time, when her scheduled med were given, she asked for various meds that were scheduled during the day and for prns that were not d/t yet.  She then asked for zofran, but she had been observed eating snacks a short time before.  Pt was encouraged to drink some ginger ale to see if that would ease her stomach issues and not rely on medications so much.  Pt was agreeable and took the ginger ale.  Pt denies SI/HI/AVH at this time.  Pt makes her needs known to staff.  Support and encouragement offered.  Discharge plans are in process.  Pt plans to go to long term treatment at discharge.  Safety maintained with q15 minute checks.

## 2016-04-15 NOTE — Progress Notes (Signed)
Patient ID: Alyssa Kelley, female   DOB: Oct 07, 1970, 45 y.o.   MRN: [id number]   Pt currently presents with a blunted affect and impulsive behavior. Pt reports to writer that their goal is to "get into somewhere like Alyssa Kelley." Pt states "I would like to stay here in [facility]lyssa Kelley but I am willing to go to Alyssa Kelley if I have to." Pt reports good sleep with current medication regimen. Pt frequently asks for medications.  Pt provided with medications per providers orders. Pt's labs and vitals were monitored throughout the night. Pt supported emotionally and encouraged to express concerns and questions. Pt educated on medications.  Pt's safety ensured with 15 minute and environmental checks. Pt endorses passive SI, no current plan. Pt currently denies HI and A/V hallucinations. Pt verbally agrees to seek staff if HI or A/VH occurs and to consult with staff before acting on any harmful thoughts. Will continue POC.

## 2016-04-15 NOTE — Progress Notes (Signed)
Data. Patient denies HI/AVH. Continues to endorse passive SI without a plan. She is able to verbally contract for safety on the unit and to come to staff before acting on any self harm thoughts/feelings.  Patient interacting well with staff and other patients. She has has low BP at times throughout the day. Fluids have been pushed., and the BP has stabilized. Affect is blunt, with no brightening. Her mood is depressed, per patient. Patient has made multiple/frequent requests for medications. On her self assessment she reports, 10/10 for depression and anxiety- no report for hopelessness. Her goal for today is: "Getting my mind off killing myself".  Action. Emotional support and encouragement offered. Education provided on medication, indications and side effect. Q 15 minute checks done for safety. Response. Safety on the unit maintained through 15 minute checks.  Medications taken as prescribed. Attended groups. Remained calm and appropriate through out shift.

## 2016-04-15 NOTE — [facility] Group Notes (Signed)
Identifying Needs   Date:  04/15/2016  Time:  0915  Type of Therapy:  Nurse Education  ;  I dentifying needs: The group is focused on teaching patients  How to identify their needs as a coping skill.  Participation Level:  Patient did not attend.   Participation Quality:  N/A  Affect:  N/A  Cognitive:  N/a  Insight: N/A   Engagement in Group:  N/a  Modes of Intervention:  N/A  Summary of Progress/Problems:  Rich BraveDuke, Sheron Robin Lynn 04/15/2016, 11:43 AM

## 2016-04-16 MED ORDER — GABAPENTIN 300 MG PO CAPS
600.0000 mg | ORAL_CAPSULE | Freq: Three times a day (TID) | ORAL | Status: DC
  Administered 2016-04-16 – 2016-04-19 (×9): 600 mg via ORAL
  Filled 2016-04-16 (×12): qty 2

## 2016-04-16 NOTE — Progress Notes (Signed)
D) Pt has been attending the program. Affect is flat and mood depressed. Pt rates her depression at a 10, hopelessness at a 10 and her anxiety at a 10. Admits to thoughts of SI. Contracts for her safety within the hospital. States, "if I were to be out of the hospital, I might hurt myself. I am safe in the hospital". Pt has been putting forth an effort and attending the groups, but otherwise is withdrawn to her room. A) Given support and praise when coming out of her room and interacting or attending the program. Encouragement given to Pt aloing with the thought of making healthy choices for herself during this hospitalization.  R) Pt remains depressed with a sad affect. Admits to SI, but contracts for her safety within the hsopital.

## 2016-04-16 NOTE — Progress Notes (Signed)
[facility]  04/16/2016 5:05 PM Alyssa Kelley  MRN:  [id number] Subjective:   Patient seen, chart reviewed and case discussed with nursing staff. No significant overnight event.   Patient states that she continues to feel anxious and like to have some medication. She denies AH, VH. She would like to be back on methadone clinic.She endorses passive SI. She reports tremors, yawning, and muscle pain. She denies AH/VH.    Principal Problem: Opioid use disorder, severe, dependence (HCC) Diagnosis:   Patient Active Problem List   Diagnosis Date Noted  . Opioid use disorder, severe, dependence (HCC) [F11.20] 04/11/2016  . Substance induced mood disorder (HCC) [F19.94] 04/10/2016  . UTI (urinary tract infection) [N39.0] 12/07/2014  . Alcohol dependence with alcohol-induced mood disorder (HCC) [F10.24]   . Major depressive disorder, recurrent episode, severe (HCC) [F33.2] 12/04/2014  . Opioid use disorder, moderate, dependence (HCC) [F11.20] 12/04/2014  . Alcohol use disorder, severe, dependence (HCC) [F10.20] 11/12/2014  . Alcohol abuse [F10.10] 09/10/2013  . Cocaine abuse [F14.10] 09/10/2013   Total Time spent with patient: 20 minutes  Past Psychiatric History: see HPI  Past Medical History:  Past Medical History:  Diagnosis Date  . Abscess of mouth   . Alcohol abuse   . Anxiety   . Benzodiazepine abuse   . Cocaine abuse   . Depression   . Hepatitis C   . Heroin abuse   . Hypertension   . 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 CESAREAN SECTION     Family History: History reviewed. No pertinent family history. Family Psychiatric  History: see HPI Social History:  History  Alcohol Use  . 50.4 oz/week  . 84 Cans of beer per week    Comment: 1 12-pack daiy     History  Drug Use  . Types: Cocaine, Oxycodone, Heroin, Benzodiazepines    Comment: Heroin, benzos    Social History   Social History  . Marital status: Single     Spouse name: N/A  . Number of children: N/A  . Years of education: N/A   Social History Main Topics  . Smoking status: Current Every Day Smoker    Packs/day: 1.00    Years: 15.00    Types: Cigarettes  . Smokeless tobacco: Never Used  . Alcohol use 50.4 oz/week    84 Cans of beer per week     Comment: 1 12-pack daiy  . Drug use:     Types: Cocaine, Oxycodone, Heroin, Benzodiazepines     Comment: Heroin, benzos  . Sexual activity: Yes    Birth control/ protection: None   Other Topics Concern  . None   Social History Narrative  . None   Additional Social History:                         Sleep: Poor  Appetite:  Fair  Current Medications: Current Facility-Administered Medications  Medication Dose Route Frequency Provider Last Rate Last Dose  . acetaminophen (TYLENOL) tablet 650 mg  650 mg Oral Q6H PRN Jackelyn PolingJason A Berry, NP   650 mg at 04/16/16 1441  . alum & mag hydroxide-simeth (MAALOX/MYLANTA) 200-200-20 MG/5ML suspension 30 mL  30 mL Oral Q4H PRN Jackelyn PolingJason A Berry, NP   30 mL at 04/10/16 2116  . cloNIDine (CATAPRES) tablet 0.1 mg  0.1 mg Oral BID Acquanetta SitElizabeth Woods Oates, MD   0.1 mg at 04/16/16 0831  .  gabapentin (NEURONTIN) capsule 500 mg  500 mg Oral TID Neysa Hotter, MD   500 mg at 04/16/16 1303  . hydrOXYzine (ATARAX/VISTARIL) tablet 50 mg  50 mg Oral TID PRN Acquanetta Sit, MD   50 mg at 04/16/16 1610  . loperamide (IMODIUM) capsule 2 mg  2 mg Oral Q6H PRN Beau Fanny, FNP   2 mg at 04/13/16 1829  . loperamide (IMODIUM) capsule 4 mg  4 mg Oral Once Beau Fanny, FNP      . magnesium hydroxide (MILK OF MAGNESIA) suspension 30 mL  30 mL Oral Daily PRN Jackelyn Poling, NP      . multivitamin with minerals tablet 1 tablet  1 tablet Oral Daily Jackelyn Poling, NP   1 tablet at 04/16/16 0831  . nicotine (NICODERM CQ - dosed in mg/24 hours) patch 21 mg  21 mg Transdermal Daily PRN Jackelyn Poling, NP      . nicotine (NICODERM CQ - dosed in mg/24 hours) patch 21 mg   21 mg Transdermal Daily Adonis Brook, NP   21 mg at 04/16/16 9604  . ondansetron (ZOFRAN-ODT) disintegrating tablet 4 mg  4 mg Oral Q8H PRN Beau Fanny, FNP   4 mg at 04/15/16 1208  . PARoxetine (PAXIL) tablet 40 mg  40 mg Oral QHS Acquanetta Sit, MD   40 mg at 04/15/16 2131  . prazosin (MINIPRESS) capsule 1 mg  1 mg Oral QHS Jackelyn Poling, NP   1 mg at 04/15/16 2131  . traZODone (DESYREL) tablet 100 mg  100 mg Oral QHS PRN Acquanetta Sit, MD   100 mg at 04/15/16 2131    Lab Results: No results found for this or any previous visit (from the past 48 hour(s)).  Blood Alcohol level:  Lab Results  Component Value Date   ETH <5 04/09/2016   ETH <5 12/04/2014    Metabolic Disorder Labs: No results found for: HGBA1C, MPG No results found for: PROLACTIN No results found for: CHOL, TRIG, HDL, CHOLHDL, VLDL, LDLCALC  Physical Findings: AIMS: Facial and Oral Movements Muscles of Facial Expression: None, normal Lips and Perioral Area: None, normal Jaw: None, normal Tongue: None, normal,Extremity Movements Upper (arms, wrists, hands, fingers): None, normal Lower (legs, knees, ankles, toes): None, normal, Trunk Movements Neck, shoulders, hips: None, normal, Overall Severity Severity of abnormal movements (highest score from questions above): None, normal Incapacitation due to abnormal movements: None, normal Patient's awareness of abnormal movements (rate only patient's report): No Awareness, Dental Status Current problems with teeth and/or dentures?: No Does patient usually wear dentures?: No  CIWA:  CIWA-Ar Total: 0 COWS:  COWS Total Score: 2  Musculoskeletal: Strength & Muscle Tone: within normal limits Gait & Station: normal Patient leans: N/A  Psychiatric Specialty Exam: Physical Exam  Review of Systems  Constitutional: Negative for diaphoresis.  Respiratory: Negative for shortness of breath.   Cardiovascular: Negative for chest pain and palpitations.   Gastrointestinal: Positive for nausea.  Neurological: Positive for tremors.  Psychiatric/Behavioral: Positive for depression, substance abuse and suicidal ideas. Negative for hallucinations. The patient is nervous/anxious and has insomnia.   All other systems reviewed and are negative.   Blood pressure 114/85, pulse 91, temperature 97.9 F (36.6 C), temperature source Oral, resp. rate 17, height 5\' 1"  (1.549 m), weight 200 lb (90.7 kg), last menstrual period 11/27/2015, SpO2 97 %.Body mass index is 37.79 kg/m.  General Appearance: Fairly Groomed  Eye Contact:  Good  Speech:  Clear and Coherent  Volume:  Normal  Mood:  Anxious  Affect:  Restricted and anxious  Thought Process:  Coherent and Goal Directed  Orientation:  Full (Time, Place, and Person)  Thought Content:  Logical Perceptions: denies AH/VH  Suicidal Thoughts:  Yes.  without intent/plan  Homicidal Thoughts:  No  Memory:  Immediate;   Good Recent;   Good Remote;   Good  Judgement:  Fair  Insight:  Fair  Psychomotor Activity:  Normal  Concentration:  Concentration: Good and Attention Span: Good  Recall:  Good  Fund of Knowledge:  Good  Language:  Good  Akathisia:  NA  Handed:  Right  AIMS (if indicated):     Assets:  Communication Skills Desire for Improvement  ADL's:  Intact  Cognition:  WNL  Sleep:  Number of Hours: 6.75   Assessment Alyssa Kelley is a 45 year old female with opioid, alcohol, cocaine, benzodiazepine use disorder, depression who presented with SI in the setting of being discharged from methadone clinic after she used benzodiazepine.   # Opioid withdrawal # Alcohol/benzodiazepine withdrawal On clonidine protocol. Patient completed on alcohol detox. Patient continues to have symptoms consistent with opioid withdrawal. Noted that patient has had  marginal hypotension on average. May consider restarting propranolol (home meds) if there is no concern for her BP. Patient does not have other  significant signs consistent with alcohol/benzo withdrawal symptoms.   # MDD # r/o substance use disorder Patient continues to endorse neurovegetative symptoms and passive SI. Patient agrees to talk with staff if SI were to worsen. Continue Paxil to target her mood. Will uptitrate gabapentin to target her anxiety/insomnia.   Plan - Continue clonidine protocol, supportive management - Continue to hold metoprolol - Increase gabapentin 600 mg TID - Continue Paxil 40 mg daily - Continue prazosin 1 mg qhs - Continue Trazodone 100 mg qhsprn for insomnia  Treatment Plan Summary: Daily contact with patient to assess and evaluate symptoms and progress in treatment  Neysa Hottereina Graysen [place], MD 04/16/2016, 5:05 PM

## 2016-04-16 NOTE — Progress Notes (Signed)
Pt in day room talking to other pts at shift change.  Pt attended group and after group requested scheduled medications.  Pt also asked for several PRN medications and was insistent on getting them.  Pt appears to be med seeking and asks what is available to take. Pt asking for several cans of soft drinks to take with meds Pt given PRN meds based on symptoms.  Pt remains safe on unit and is in bed sleeping..Marland Kitchen

## 2016-04-16 NOTE — [facility] Group Notes (Signed)
[facility] LCSW Group Therapy Note  04/16/2016 / 12:44 PM  Type of Therapy and Topic:  Group Therapy and Supports following Discharge  Participation Level:  Did Not Attend; invited to participate yet did not despite overhead announcement and encouragement by staff   Carney Bernatherine C Dreyah Montrose, LCSW

## 2016-04-17 MED ORDER — NICOTINE 21 MG/24HR TD PT24: 21.0000 mg | MEDICATED_PATCH | Freq: Every day | TRANSDERMAL | 0 refills | Status: DC | PRN

## 2016-04-17 MED ORDER — HYDROXYZINE HCL 50 MG PO TABS: 50.0000 mg | ORAL_TABLET | Freq: Three times a day (TID) | ORAL | 0 refills | Status: DC | PRN

## 2016-04-17 MED ORDER — TRAZODONE HCL 100 MG PO TABS: 100.0000 mg | ORAL_TABLET | Freq: Every evening | ORAL | 0 refills | Status: DC | PRN

## 2016-04-17 MED ORDER — GABAPENTIN 300 MG PO CAPS: 600.0000 mg | ORAL_CAPSULE | Freq: Three times a day (TID) | ORAL | 0 refills | Status: DC

## 2016-04-17 MED ORDER — PRAZOSIN HCL 1 MG PO CAPS: 1.0000 mg | ORAL_CAPSULE | Freq: Every day | ORAL | 0 refills | Status: DC

## 2016-04-17 MED ORDER — PAROXETINE HCL 40 MG PO TABS: 40.0000 mg | ORAL_TABLET | Freq: Every day | ORAL | 0 refills | Status: DC

## 2016-04-17 MED ORDER — GABAPENTIN 600 MG PO TABS
600.0000 mg | ORAL_TABLET | Freq: Three times a day (TID) | ORAL | Status: DC
  Filled 2016-04-17 (×3): qty 42

## 2016-04-17 MED ORDER — CLONIDINE HCL 0.1 MG PO TABS: 0.1000 mg | ORAL_TABLET | Freq: Two times a day (BID) | ORAL | 0 refills | Status: DC

## 2016-04-17 NOTE — Progress Notes (Signed)
  [facility] Discharge Plan :  Will you be returning to the same living situation after discharge:  No. [facility]. At discharge, do you have transportation home?: Yes,  taxi voucher in chart. Pt must discharge no later than 10:30AM.  Do you have the ability to pay for your medications: Yes,  mental health  Release of information consent forms completed and submitted to medical records by CSW.  Follow-up Information    [facility] Follow up on 04/17/2016.   Why:  Appt on this date at 11:45AM for screening and possible admission. Please bring photo ID/proof of [place] residence. Thank you.  Contact information: [address] [place] [address] [phone]        [facility] .   Why:  Referral faxed: 04/12/16. Please continue to call [facility] admissions daily in the morning if you are interested in pursuing treatment there. No beds available currently for women.  Contact information: [address] [place], [place] [address] Phone: [phone] Fax: [phone]       [facility] .   Specialty:  Behavioral Health Why:  Walk in between 8am-9am Monday through Friday for hospital follow-up/medication management/assessment for counseling services.  Contact information: [address] [place] [address] [phone]           Next level of care provider has access to [facility] Link:no  Safety Planning and Suicide Prevention discussed: Yes,  SPE completed with pt's aunt.  Have you used any form of tobacco in the last 30 days? (Cigarettes, Smokeless Tobacco, Cigars, and/or Pipes): Yes  Has patient been referred to the Quitline?: Patient refused referral  Patient has been referred for addiction treatment: Yes  Darrah Dredge N Smart LCSW 04/17/2016, 8:34 AM

## 2016-04-17 NOTE — Progress Notes (Signed)
Patient A/O, no noted distress. Patient notes her day went, "okay I'm just stress out things." She is noted to have PTSD, but did not want to go into details. She notes she continues to have suicidal thoughts her plan is to overdose and notes her appetite has decreased. Patient was lying in bed at the time of assessment and she noted she did not want to be around anyone right now. Writer ask patient would she contract to safety; patient contracted to safety. Staff will continue to monitor, maintain safety, and meet needs.

## 2016-04-17 NOTE — Discharge Summary (Addendum)
Physician Discharge Summary Note  Patient:  Alyssa Kelley is an 45 y.o., female MRN:  [id number] DOB:  July 04, 1970 Patient phone:  [phone] (home)  Patient address:   [address] [place] [address],  Total Time spent with patient: 45 minutes  Date of Admission:  04/10/2016 Date of Discharge: 04/19/16  Reason for Admission:   The patient reports that she was "kicked out" of the methadone clinic for using benzodiazepines about 5 days ago. She reports that she's had several weeks of feeling depressed mood hopelessness and loneliness decreased sleep and suicidal ideations as well. Currently she reports some shaking and nausea with opiate withdrawal.  Currently she endorses passive suicidal ideation stating "I wish I was dead." She denies any psychotic or mania symptoms currently and she denies any homicidal ideation, plan or intent. She states she started using drugs around age 45 after the death of her parents. Her father was murdered and her mother died about 8 months later of cirrhosis of the liver related to alcohol use.  Principal Problem: Opioid use disorder, severe, dependence [place](HCC) Discharge Diagnoses: Patient Active Problem List   Diagnosis Date Noted  . Opioid use disorder, severe, dependence (HCC) [F11.20] 04/11/2016  . Substance induced mood disorder (HCC) [F19.94] 04/10/2016  . UTI (urinary tract infection) [N39.0] 12/07/2014  . Alcohol dependence with alcohol-induced mood disorder (HCC) [F10.24]   . Major depressive disorder, recurrent episode, severe (HCC) [F33.2] 12/04/2014  . Opioid use disorder, moderate, dependence (HCC) [F11.20] 12/04/2014  . Alcohol use disorder, severe, dependence (HCC) [F10.20] 11/12/2014  . Alcohol abuse [F10.10] 09/10/2013  . Cocaine abuse [F14.10] 09/10/2013    Past Psychiatric History: See H&P  Past Medical History:  Past Medical History:  Diagnosis Date  . Abscess of mouth   . Alcohol abuse   . Anxiety   . Benzodiazepine  abuse   . Cocaine abuse   . Depression   . Hepatitis C   . Heroin abuse   . Hypertension   . 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 CESAREAN SECTION     Family History: History reviewed. No pertinent family history. Family Psychiatric  History: See H&P Social History:  History  Alcohol Use  . 50.4 oz/week  . 84 Cans of beer per week    Comment: 1 12-pack daiy     History  Drug Use  . Types: Cocaine, Oxycodone, Heroin, Benzodiazepines    Comment: Heroin, benzos    Social History   Social History  . Marital status: Single    Spouse name: N/A  . Number of children: N/A  . Years of education: N/A   Social History Main Topics  . Smoking status: Current Every Day Smoker    Packs/day: 1.00    Years: 15.00    Types: Cigarettes  . Smokeless tobacco: Never Used  . Alcohol use 50.4 oz/week    84 Cans of beer per week     Comment: 1 12-pack daiy  . Drug use:     Types: Cocaine, Oxycodone, Heroin, Benzodiazepines     Comment: Heroin, benzos  . Sexual activity: Yes    Birth control/ protection: None   Other Topics Concern  . None   Social History Narrative  . None    Hospital Course:   Alyssa Kelley was admitted for Opioid use disorder, severe, dependence (HCC) ,  and crisis management.  Pt was treated discharged with the medications listed below under  Medication List.  Medical problems were identified and treated as needed.  Home medications were restarted as appropriate.  Improvement was monitored by observation and Alyssa Kelley 's daily report of symptom reduction.  Emotional and mental status was monitored by daily self-inventory reports completed by Alyssa Kelley and clinical staff.         Alyssa Kelley was evaluated by the treatment team for stability and plans for continued recovery upon discharge. Alyssa Kelley 's motivation was an integral factor for scheduling further treatment. Employment, transportation, bed  availability, health status, family support, and any pending legal issues were also considered during hospital stay. Pt was offered further treatment options upon discharge including but not limited to Residential, Intensive Outpatient, and Outpatient treatment.  Alyssa Kelley will follow up with the services as listed below under Follow Up Information.     Upon completion of this admission the patient was both mentally and medically stable for discharge denying suicidal/homicidal ideation, auditory/visual/tactile hallucinations, delusional thoughts and paranoia.    Alyssa Kelley responded well to treatment with vistaril, nicotine, paxil, minipress, trazodone without adverse effects. Pt demonstrated improvement without reported or observed adverse effects to the point of stability appropriate for outpatient management. Pertinent labs include: benzo, cocaine. Reviewed CBC, CMP, BAL, and UDS; all unremarkable aside from noted exceptions.   Physical Findings: AIMS: Facial and Oral Movements Muscles of Facial Expression: None, normal Lips and Perioral Area: None, normal Jaw: None, normal Tongue: None, normal,Extremity Movements Upper (arms, wrists, hands, fingers): None, normal Lower (legs, knees, ankles, toes): None, normal, Trunk Movements Neck, shoulders, hips: None, normal, Overall Severity Severity of abnormal movements (highest score from questions above): None, normal Incapacitation due to abnormal movements: None, normal Patient's awareness of abnormal movements (rate only patient's report): No Awareness, Dental Status Current problems with teeth and/or dentures?: No Does patient usually wear dentures?: No  CIWA:  CIWA-Ar Total: 1 COWS:  COWS Total Score: 1  Musculoskeletal: Strength & Muscle Tone: within normal limits Gait & Station: normal Patient leans: N/A  Psychiatric Specialty Exam: Physical Exam  Review of Systems  Psychiatric/Behavioral: Positive for depression and  substance abuse. Negative for hallucinations and suicidal ideas. The patient is nervous/anxious and has insomnia.   All other systems reviewed and are negative.   Blood pressure 124/85, pulse 79, temperature 99.3 F (37.4 C), temperature source Oral, resp. rate 18, height 5\' 1"  (1.549 m), weight 90.7 kg (200 lb), last menstrual period 11/27/2015, SpO2 97 %.Body mass index is 37.79 kg/m.  SEE MD PSE WITHIN SRA   Have you used any form of tobacco in the last 30 days? (Cigarettes, Smokeless Tobacco, Cigars, and/or Pipes): Yes  Has this patient used any form of tobacco in the last 30 days? (Cigarettes, Smokeless Tobacco, Cigars, and/or Pipes) Yes, Yes, A prescription for an FDA-approved tobacco cessation medication was offered at discharge and the patient refused  Blood Alcohol level:  Lab Results  Component Value Date   ETH <5 04/09/2016   ETH <5 12/04/2014    Metabolic Disorder Labs:  No results found for: HGBA1C, MPG No results found for: PROLACTIN No results found for: CHOL, TRIG, HDL, CHOLHDL, VLDL, LDLCALC  See Psychiatric Specialty Exam and Suicide Risk Assessment completed by Attending Physician prior to discharge.  Discharge destination:  Other:  long-term rehab  Is patient on multiple antipsychotic therapies at discharge:  No   Has Patient had three or more failed trials of antipsychotic monotherapy by history:  No  Recommended Plan for Multiple Antipsychotic Therapies: NA     Medication List    STOP taking these medications   albuterol 108 (90 Base) MCG/ACT inhaler Commonly known as:  PROVENTIL HFA;VENTOLIN HFA   doxepin 25 MG capsule Commonly known as:  SINEQUAN   propranolol 10 MG tablet Commonly known as:  INDERAL     TAKE these medications     Indication  cloNIDine 0.1 MG tablet Commonly known as:  CATAPRES Take 1 tablet (0.1 mg total) by mouth 2 (two) times daily.  Indication:  High Blood Pressure Disorder   gabapentin 300 MG capsule Commonly known  as:  NEURONTIN Take 2 capsules (600 mg total) by mouth 3 (three) times daily. What changed:  how much to take  when to take this  Indication:  Pain managment   hydrOXYzine 50 MG tablet Commonly known as:  ATARAX/VISTARIL Take 1 tablet (50 mg total) by mouth every 6 (six) hours as needed for anxiety (or insomnia). What changed:  medication strength  how much to take  when to take this  reasons to take this  Indication:  Anxiety Neurosis, Sedation   nicotine 21 mg/24hr patch Commonly known as:  NICODERM CQ - dosed in mg/24 hours Place 1 patch (21 mg total) onto the skin daily as needed (Nicotine Craving).  Indication:  Nicotine Addiction   PARoxetine 40 MG tablet Commonly known as:  PAXIL Take 1 tablet (40 mg total) by mouth at bedtime. What changed:  medication strength  how much to take  when to take this  Indication:  Major Depressive Disorder   prazosin 1 MG capsule Commonly known as:  MINIPRESS Take 1 capsule (1 mg total) by mouth at bedtime.  Indication:  Nightmares      Follow-up Information    [facility] Follow up on 04/17/2016.   Why:  Appt on this date at 11:45AM for screening and possible admission. Please bring photo ID/proof of [place] residence. Thank you.  Contact information: [address] [place] [address] [phone]        [facility] .   Why:  Referral faxed: 04/12/16. Please continue to call [facility] admissions daily in the morning if you are interested in pursuing treatment there. No beds available currently for women.  Contact information: [address] [place], [place] [address] Phone: [phone] Fax: [phone]       [facility] .   Specialty:  Behavioral Health Why:  Walk in between 8am-9am Monday through Friday for hospital follow-up/medication management/assessment for counseling services.  [address] [place] [address] [phone]           Follow-up  recommendations:  Activity:  As tolerated Diet:  Heart healthy with low sodium.  Comments:   Take all medications as prescribed. Keep all follow-up appointments as scheduled.  Do not consume alcohol or use illegal drugs while on prescription medications. Report any adverse effects from your medications to your primary care provider promptly.  In the event of recurrent symptoms or worsening symptoms, call 911, a crisis hotline, or go to the nearest emergency department for evaluation.   Signed: Beau Fanny, FNP 04/19/2016, 8:47 AM

## 2016-04-17 NOTE — Progress Notes (Signed)
After patient found out she was being discharged today, patient filled out her self inventory sheet.  Poor sleep, sleep medication is not helpful.  Fair appetite, low energy level, poor concentration.  Rated depression, hopeless and anxiety #10.  SI.  Physical problems, pain, headaches, all over, worst pain #10.  Checked withdrawals, tremors, chilling, cravings, cramping, agitation, irritability.   Feeling suicidal thought, not leaving still.  "I need to stay a couple days please I feel.  [facility] to go back [facility]." SW informed who will discuss with MD.

## 2016-04-17 NOTE — Progress Notes (Signed)
Patient stated she is suicidal if she has to discharge today, no where to go.  MD informed.  Will not hurt herself while a [facility], contracts for safety.  SW to talk with patient also.

## 2016-04-17 NOTE — Tx Team (Signed)
Interdisciplinary Treatment and Diagnostic Plan Update  04/17/2016 Time of Session: 9:30AM GISSELL BARRA MRN: [id number]  Principal Diagnosis: Opioid use disorder, severe, dependence ([place])  Secondary Diagnoses: Principal Problem:   Opioid use disorder, severe, dependence ([place]) Active Problems:   Substance induced mood disorder ([place])   Current Medications:  Current Facility-Administered Medications  Medication Dose Route Frequency Provider Last Rate Last Dose  . acetaminophen (TYLENOL) tablet 650 mg  650 mg Oral Q6H PRN Rozetta Nunnery, NP   650 mg at 04/16/16 1441  . alum & mag hydroxide-simeth (MAALOX/MYLANTA) 200-200-20 MG/5ML suspension 30 mL  30 mL Oral Q4H PRN Rozetta Nunnery, NP   30 mL at 04/10/16 2116  . cloNIDine (CATAPRES) tablet 0.1 mg  0.1 mg Oral BID Linard Millers, MD   0.1 mg at 04/16/16 1732  . gabapentin (NEURONTIN) capsule 600 mg  600 mg Oral TID Norman Clay, MD   600 mg at 04/16/16 1729  . hydrOXYzine (ATARAX/VISTARIL) tablet 50 mg  50 mg Oral TID PRN Linard Millers, MD   50 mg at 04/16/16 2202  . loperamide (IMODIUM) capsule 2 mg  2 mg Oral Q6H PRN Benjamine Mola, FNP   2 mg at 04/13/16 1829  . loperamide (IMODIUM) capsule 4 mg  4 mg Oral Once Benjamine Mola, FNP      . magnesium hydroxide (MILK OF MAGNESIA) suspension 30 mL  30 mL Oral Daily PRN Rozetta Nunnery, NP      . multivitamin with minerals tablet 1 tablet  1 tablet Oral Daily Rozetta Nunnery, NP   1 tablet at 04/16/16 0831  . nicotine (NICODERM CQ - dosed in mg/24 hours) patch 21 mg  21 mg Transdermal Daily PRN Rozetta Nunnery, NP      . nicotine (NICODERM CQ - dosed in mg/24 hours) patch 21 mg  21 mg Transdermal Daily Kerrie Buffalo, NP   21 mg at 04/16/16 4818  . ondansetron (ZOFRAN-ODT) disintegrating tablet 4 mg  4 mg Oral Q8H PRN Benjamine Mola, FNP   4 mg at 04/16/16 2201  . PARoxetine (PAXIL) tablet 40 mg  40 mg Oral QHS Linard Millers, MD   40 mg at 04/16/16 2157  . prazosin (MINIPRESS)  capsule 1 mg  1 mg Oral QHS Rozetta Nunnery, NP   1 mg at 04/16/16 2158  . traZODone (DESYREL) tablet 100 mg  100 mg Oral QHS PRN Linard Millers, MD   100 mg at 04/16/16 2158   PTA Medications: Prescriptions Prior to Admission  Medication Sig Dispense Refill Last Dose  . doxepin (SINEQUAN) 25 MG capsule Take 25 mg by mouth at bedtime.   04/08/2016 at Unknown time  . gabapentin (NEURONTIN) 300 MG capsule Take 1 capsule (300 mg total) by mouth 2 (two) times daily. (Patient taking differently: Take 300 mg by mouth 3 (three) times daily. ) 60 capsule 0 04/08/2016 at Unknown time  . hydrOXYzine (ATARAX/VISTARIL) 25 MG tablet Take 1 tablet (25 mg total) by mouth 3 (three) times daily. 90 tablet 0 04/08/2016 at Unknown time  . PARoxetine (PAXIL) 30 MG tablet Take 30 mg by mouth daily.   04/08/2016 at Unknown time  . prazosin (MINIPRESS) 1 MG capsule Take 1 capsule (1 mg total) by mouth at bedtime. 30 capsule 0 04/08/2016 at Unknown time  . propranolol (INDERAL) 10 MG tablet Take 1 tablet (10 mg total) by mouth 3 (three) times daily. For anxiety 90 tablet 0 04/08/2016 at  Unknown time  . albuterol (PROVENTIL HFA;VENTOLIN HFA) 108 (90 Base) MCG/ACT inhaler Inhale 2 puffs into the lungs every 4 (four) hours as needed for wheezing or shortness of breath. (Patient not taking: Reported on 04/11/2016) 1 Inhaler 1 Not Taking at Unknown time    Patient Stressors: Financial difficulties Substance abuse  Patient Strengths: Ability for Estate manager/land agent for treatment/growth Physical Health  Treatment Modalities: Medication Management, Group therapy, Case management,  1 to 1 session with clinician, Psychoeducation, Recreational therapy.   Physician Treatment Plan for Primary Diagnosis: Opioid use disorder, severe, dependence ([place]) Long Term Goal(s): Improvement in symptoms so as ready for discharge Improvement in symptoms so as ready for discharge   Short Term Goals: Ability to  disclose and discuss suicidal ideas Ability to demonstrate self-control will improve Ability to identify changes in lifestyle to reduce recurrence of condition will improve Ability to demonstrate self-control will improve Ability to identify and develop effective coping behaviors will improve Compliance with prescribed medications will improve Ability to identify triggers associated with substance abuse/mental health issues will improve  Medication Management: Evaluate patient's response, side effects, and tolerance of medication regimen.  Therapeutic Interventions: 1 to 1 sessions, Unit Group sessions and Medication administration.  Evaluation of Outcomes: Adequate for discharge   Physician Treatment Plan for Secondary Diagnosis: Principal Problem:   Opioid use disorder, severe, dependence ([place]) Active Problems:   Substance induced mood disorder ([place])  Long Term Goal(s): Improvement in symptoms so as ready for discharge Improvement in symptoms so as ready for discharge   Short Term Goals: Ability to disclose and discuss suicidal ideas Ability to demonstrate self-control will improve Ability to identify changes in lifestyle to reduce recurrence of condition will improve Ability to demonstrate self-control will improve Ability to identify and develop effective coping behaviors will improve Compliance with prescribed medications will improve Ability to identify triggers associated with substance abuse/mental health issues will improve     Medication Management: Evaluate patient's response, side effects, and tolerance of medication regimen.  Therapeutic Interventions: 1 to 1 sessions, Unit Group sessions and Medication administration.  Evaluation of Outcomes: Met   RN Treatment Plan for Primary Diagnosis: Opioid use disorder, severe, dependence ([place]) Long Term Goal(s): Knowledge of disease and therapeutic regimen to maintain health will improve  Short Term Goals: Ability to  remain free from injury will improve, Ability to disclose and discuss suicidal ideas and Ability to identify and develop effective coping behaviors will improve  Medication Management: RN will administer medications as ordered by provider, will assess and evaluate patient's response and provide education to patient for prescribed medication. RN will report any adverse and/or side effects to prescribing provider.  Therapeutic Interventions: 1 on 1 counseling sessions, Psychoeducation, Medication administration, Evaluate responses to treatment, Monitor vital signs and CBGs as ordered, Perform/monitor CIWA, COWS, AIMS and Fall Risk screenings as ordered, Perform wound care treatments as ordered.  Evaluation of Outcomes: Met   LCSW Treatment Plan for Primary Diagnosis: Opioid use disorder, severe, dependence ([place]) Long Term Goal(s): Safe transition to appropriate next level of care at discharge, Engage patient in therapeutic group addressing interpersonal concerns.  Short Term Goals: Engage patient in aftercare planning with referrals and resources, Facilitate patient progression through stages of change regarding substance use diagnoses and concerns and Identify triggers associated with mental health/substance abuse issues  Therapeutic Interventions: Assess for all discharge needs, 1 to 1 time with Social worker, Explore available resources and support systems, Assess for adequacy in community support network,  Educate family and significant other(s) on suicide prevention, Complete Psychosocial Assessment, Interpersonal group therapy.  Evaluation of Outcomes:Adequate for discharge    Progress in Treatment: Attending groups: Yes. Participating in groups: Yes. Taking medication as prescribed: Yes. Toleration medication: Yes. Family/Significant other contact made: SPE completed with pt's aunt.  Patient understands diagnosis: Yes. Discussing patient identified problems/goals with staff:  Yes. Medical problems stabilized or resolved: Yes. Denies suicidal/homicidal ideation: Yes. Issues/concerns per patient self-inventory: No. Other: n/a  New problem(s) identified: No, Describe:  n/a  New Short Term/Long Term Goal(s):  Discharge Plan or Barriers: [facility] has [facility] scheduled for Monday at 11:45AM. She has been referred to [facility] as well--no beds available at this time. [facility] for o/p services. Taxi voucher in chart.   Reason for Continuation of Hospitalization: none  Estimated Length of Stay: d/c scheduled for today 04/17/2016   Attendees: Patient: 04/17/2016 8:36 AM  Physician: Dr. Sharolyn Douglas MD 04/17/2016 8:36 AM  Nursing: Douglass Rivers RN 04/17/2016 8:36 AM  RN Care Manager: Lars Pinks CM 04/17/2016 8:36 AM  Social Worker: Maxie Better, LCSW 04/17/2016 8:36 AM  Recreational Therapist:  04/17/2016 8:36 AM  Other:  04/17/2016 8:36 AM  Other:  04/17/2016 8:36 AM  Other: 04/17/2016 8:36 AM    Scribe for Treatment Team: [place], LCSW 04/17/2016 8:36 AM

## 2016-04-17 NOTE — Progress Notes (Signed)
Recreation Therapy Notes  Date: 04/17/16 Time: 0930 Location: [address] Dayroom  Group Topic: Stress Management  Goal Area(s) Addresses:  Patient will verbalize importance of using healthy stress management.  Patient will identify positive emotions associated with healthy stress management.   Intervention: Calm App  Activity :  Body Scan Meditation.  LRT introduced the stress management technique of meditation.  LRT played Kelley body scan meditation from the Calm app to allow the patients to engage in the activity.  Patients were to follow along as the meditation was being played.  Education:  Stress Management, Discharge Planning.   Education Outcome: Acknowledges edcuation/In group clarification offered/Needs additional education  Clinical Observations/Feedback: Pt did not attend group.    Alyssa Kelley, LRT/CTRS         Alyssa Kelley 04/17/2016 12:16 PM 

## 2016-04-17 NOTE — [facility] Suicide Risk Assessment (Deleted)
[facility]   Principal Problem: Opioid use disorder, severe, dependence [facility](HCC) Discharge Diagnoses:  Patient Active Problem List   Diagnosis Date Noted  . Opioid use disorder, severe, dependence (HCC) [F11.20] 04/11/2016  . Substance induced mood disorder (HCC) [F19.94] 04/10/2016  . UTI (urinary tract infection) [N39.0] 12/07/2014  . Alcohol dependence with alcohol-induced mood disorder (HCC) [F10.24]   . Major depressive disorder, recurrent episode, severe (HCC) [F33.2] 12/04/2014  . Opioid use disorder, moderate, dependence (HCC) [F11.20] 12/04/2014  . Alcohol use disorder, severe, dependence (HCC) [F10.20] 11/12/2014  . Alcohol abuse [F10.10] 09/10/2013  . Cocaine abuse [F14.10] 09/10/2013    Total Time spent with patient: 15 minutes  Musculoskeletal: Strength & Muscle Tone: within normal limits Gait & Station: normal Patient leans: N/A  Psychiatric Specialty Exam: ROS  Blood pressure 124/85, pulse 79, temperature 99.3 F (37.4 C), temperature source Oral, resp. rate 18, height 5\' 1"  (1.549 m), weight 90.7 kg (200 lb), last menstrual period 11/27/2015, SpO2 97 %.Body mass index is 37.79 kg/m.  General Appearance: Casual  Eye Contact::  Good  Speech:  Clear and Coherent409  Volume:  Normal  Mood:  Anxious  Affect:  Appropriate  Thought Process:  Coherent  Orientation:  Negative  Thought Content:  Negative  Suicidal Thoughts:  No  Homicidal Thoughts:  No  Memory:  Negative  Judgement:  Fair  Insight:  Fair  Psychomotor Activity:  Normal  Concentration:  Good  Recall:  Good  Fund of Knowledge:Good  Language: Good  Akathisia:  No  Handed:  Right  AIMS (if indicated):     Assets:  Resilience  Sleep:  Number of Hours: 6.75  Cognition: WNL  ADL's:  Intact   Mental Status Per Nursing Assessment::   On Admission:  Suicidal ideation indicated by patient, Self-harm thoughts, Self-harm behaviors  Demographic Factors:  Caucasian  Loss  Factors: NA  Historical Factors: Impulsivity  Risk Reduction Factors:   Positive social support  Continued Clinical Symptoms:  Alcohol/Substance Abuse/Dependencies  Cognitive Features That Contribute To Risk:  None    Suicide Risk:  Mild:  Suicidal ideation of limited frequency, intensity, duration, and specificity.  There are no identifiable plans, no associated intent, mild dysphoria and related symptoms, good self-control (both objective and subjective assessment), few other risk factors, and identifiable protective factors, including available and accessible social support.  Follow-up Information    [facility] Follow up on 04/17/2016.   Why:  Appt on this date at 11:45AM for screening and possible admission. Please bring photo ID/proof of [place] residence. Thank you.  Contact information: [place] [address] [place] [address] [phone]        [facility] .   Why:  Referral faxed: 04/12/16 Contact information: [address] [place], [place] [address] Phone: [phone] Fax: [phone]       [facility] .   Specialty:  Behavioral Health Why:  Walk in between 8am-9am Monday through Friday for hospital follow-up/medication management/assessment for counseling services.  Contact information: [address] [place] [address] [phone]           Plan Of Care/Follow-up recommendations:  Other:  Pt denies acute or present plan or intent to act on any suicidal or homicidal thoughts,. has screening scheduled for further substance treatment today and should keep that appointment.  Acquanetta SitElizabeth Woods Jahad Old, MD 04/17/2016, 8:25 AM

## 2016-04-17 NOTE — Progress Notes (Signed)
[facility]  04/17/2016 1:56 PM  Patient Active Problem List   Diagnosis Date Noted  . Opioid use disorder, severe, dependence (HCC) 04/11/2016  . Substance induced mood disorder (HCC) 04/10/2016  . UTI (urinary tract infection) 12/07/2014  . Alcohol dependence with alcohol-induced mood disorder (HCC)   . Major depressive disorder, recurrent episode, severe (HCC) 12/04/2014  . Opioid use disorder, moderate, dependence (HCC) 12/04/2014  . Alcohol use disorder, severe, dependence (HCC) 11/12/2014  . Alcohol abuse 09/10/2013  . Cocaine abuse 09/10/2013    Diagnosis: Opiate use disorder severe  Subjective: Patient was scheduled to discharge today but reports "I'm suicidal if I am discharged I'm going to act on it." Patient and I discussed her anxiety around discharge and separation from the unit and patient acknowledges that these are issues and cannot identify directly but would change with staying another day. However she does identify uncertainty about follow-up as an issue and does have and it screening interview with our, at 2:30 today. Her current Child psychotherapistsocial worker has just come on the service and is just beginning to work with her as well area patient does express acute suicidality if discharged in addition. The patient and I discussed the pros and cons of various courses of action and at present patient will stay to participate in her phone interview and to work with social worker to solidify some discharge plans which may reduce her anxiety and increase her safety after discharge.  Objective: Well developed well nourished woman in no apparent distress neatly groomed and dressed in casual clothing speech and motor within normal limits mood is described as anxious and depressed affect is somewhat anxious thought processes linear and goal-directed thought content endorses that she will harm herself if released today insight and judgment are somewhat limited IQ appears an average  range     Current Facility-Administered Medications (Cardiovascular):  .  cloNIDine (CATAPRES) tablet 0.1 mg .  prazosin (MINIPRESS) capsule 1 mg  Current Outpatient Prescriptions (Cardiovascular):  .  cloNIDine (CATAPRES) 0.1 MG tablet, Take 1 tablet (0.1 mg total) by mouth 2 (two) times daily. .  prazosin (MINIPRESS) 1 MG capsule, Take 1 capsule (1 mg total) by mouth at bedtime.    Current Facility-Administered Medications (Analgesics):  .  acetaminophen (TYLENOL) tablet 650 mg     Current Facility-Administered Medications (Other):  .  alum & mag hydroxide-simeth (MAALOX/MYLANTA) 200-200-20 MG/5ML suspension 30 mL .  gabapentin (NEURONTIN) capsule 600 mg .  hydrOXYzine (ATARAX/VISTARIL) tablet 50 mg .  loperamide (IMODIUM) capsule 2 mg .  loperamide (IMODIUM) capsule 4 mg .  magnesium hydroxide (MILK OF MAGNESIA) suspension 30 mL .  multivitamin with minerals tablet 1 tablet .  nicotine (NICODERM CQ - dosed in mg/24 hours) patch 21 mg .  ondansetron (ZOFRAN-ODT) disintegrating tablet 4 mg .  PARoxetine (PAXIL) tablet 40 mg .  traZODone (DESYREL) tablet 100 mg  Current Outpatient Prescriptions (Other):  .  gabapentin (NEURONTIN) 300 MG capsule, Take 2 capsules (600 mg total) by mouth 3 (three) times daily. .  hydrOXYzine (ATARAX/VISTARIL) 50 MG tablet, Take 1 tablet (50 mg total) by mouth 3 (three) times daily as needed for anxiety. .  nicotine (NICODERM CQ - DOSED IN MG/24 HOURS) 21 mg/24hr patch, Place 1 patch (21 mg total) onto the skin daily as needed (Nicotine Craving). Marland Kitchen.  PARoxetine (PAXIL) 40 MG tablet, Take 1 tablet (40 mg total) by mouth at bedtime. .  traZODone (DESYREL) 100 MG tablet, Take 1 tablet (100 mg  total) by mouth at bedtime as needed for sleep.  Vital Signs:Blood pressure 131/78, pulse (!) 58, temperature 99.3 F (37.4 C), temperature source Oral, resp. rate 18, height 5\' 1"  (1.549 m), weight 90.7 kg (200 lb), last menstrual period 11/27/2015, SpO2 97  %.    Lab Results: No results found for this or any previous visit (from the past 48 hour(s)).  Physical Findings: AIMS: Facial and Oral Movements Muscles of Facial Expression: None, normal Lips and Perioral Area: None, normal Jaw: None, normal Tongue: None, normal,Extremity Movements Upper (arms, wrists, hands, fingers): None, normal Lower (legs, knees, ankles, toes): None, normal, Trunk Movements Neck, shoulders, hips: None, normal, Overall Severity Severity of abnormal movements (highest score from questions above): None, normal Incapacitation due to abnormal movements: None, normal Patient's awareness of abnormal movements (rate only patient's report): No Awareness, Dental Status Current problems with teeth and/or dentures?: No Does patient usually wear dentures?: No  CIWA:  CIWA-Ar Total: 1 COWS:  COWS Total Score: 1   Assessment/Plan: Patient and I discussed that staying because she is anxious about being discharged or feel comfortable here is not a situation that can continue indefinitely and that if her symptoms do not resolve with inpatient treatment and clearly inpatient treatment is no longer indicated. There are some specific interventions which might help diffuse the situation going on today however and the patient will stay today to ensure a more safe discharge plan as possible. Secondary to long-standing anxiety the patient may always exhibit some thoughts of self-harm or anxiety around separation and we will make a decision tomorrow about what would be most therapeutic for her. Acquanetta SitElizabeth Woods Ena Demary, MD 04/17/2016, 1:56 PM

## 2016-04-17 NOTE — Progress Notes (Signed)
Patient was educated on community resources and also educated on staff being available at all times when needed. Patient noted she will contract to safety. Patient was educated on medications, as well.

## 2016-04-17 NOTE — BHH Group Notes (Signed)
Alyssa Kelley was invited to attend group.   Did not attend    spiritual care group on grief and loss facilitated by chaplain Burnis KingfisherMatthew Anamae Rochelle   Group opened with brief discussion and psycho-social ed around grief and loss in relationships and in relation to self - identifying life patterns, circumstances, changes that cause losses. Established group norm of speaking from own life experience. Group goal of establishing open and affirming space for members to share loss and experience with grief, normalize grief experience and provide psycho social education and grief support.

## 2016-04-17 NOTE — Progress Notes (Signed)
D:  Patient denied SI and HI, contracts for safety.  Denied A/V hallucinations.   A:  Medications administered per MD orders.  Emotional support and encouragement given patient. R:  Safety maintained with 15 minute checks.  

## 2016-04-18 MED ORDER — HYDROXYZINE HCL 50 MG PO TABS: 50.0000 mg | ORAL_TABLET | Freq: Four times a day (QID) | ORAL | 0 refills | Status: DC | PRN

## 2016-04-18 MED ORDER — HYDROXYZINE HCL 50 MG PO TABS
50.0000 mg | ORAL_TABLET | Freq: Every evening | ORAL | Status: DC | PRN
  Administered 2016-04-18 (×2): 50 mg via ORAL
  Filled 2016-04-18: qty 1

## 2016-04-18 NOTE — Progress Notes (Signed)
Discharge has been cancelled per Dr. Weldon Pickingates--[facility] (Wednesday). 21 day supply of medications needed.  Trula SladeHeather Smart, MSW, LCSW Clinical Social Worker 04/18/2016 3:10 PM

## 2016-04-18 NOTE — Progress Notes (Signed)
D: Patient denies SI/HI and A/V hallucinations  A: Monitored q 15 minutes; patient encouraged to attend groups; patient educated about medications; patient given medications per physician orders; patient encouraged to express feelings and/or concerns  R: Patient is cooperative; patient does not want to go back to living enviroment; patient wants to go to treatment facility; patient was able to set goal to talk with staff 1:1 when having feelings of SI; patient is taking medications as prescribed and tolerating medications; patient is attending all groups

## 2016-04-18 NOTE — Progress Notes (Signed)
[facility]  04/18/2016 3:11 PM  Patient Active Problem List   Diagnosis Date Noted  . Opioid use disorder, severe, dependence (HCC) 04/11/2016  . Substance induced mood disorder (HCC) 04/10/2016  . UTI (urinary tract infection) 12/07/2014  . Alcohol dependence with alcohol-induced mood disorder (HCC)   . Major depressive disorder, recurrent episode, severe (HCC) 12/04/2014  . Opioid use disorder, moderate, dependence (HCC) 12/04/2014  . Alcohol use disorder, severe, dependence (HCC) 11/12/2014  . Alcohol abuse 09/10/2013  . Cocaine abuse 09/10/2013    Diagnosis: Opiate use disorder severe  Subjective: Patient was scheduled to discharge today and did appear to be tolerating the procedure but expressed some ongoing anxiety and chronic suicidal ideation exacerbation. [facility] however [facility] and agrees to stay overnight to ensure smooth transition to her next step in treatment.  Objective: Well-developed well-nourished woman in no apparent distress speech and motor within normal limits mood described as anxious and affect was congruent thought processes linear and goal-directed thought content endorses chronic suicidal ideation, no plan to act immediately, denies any homicidal ideation, plan or intent denies any psychotic symptoms, alert and oriented 3 insight and judgment are fair IQ appears an average range     Current Facility-Administered Medications (Cardiovascular):  .  cloNIDine (CATAPRES) tablet 0.1 mg .  prazosin (MINIPRESS) capsule 1 mg  Current Outpatient Prescriptions (Cardiovascular):  .  cloNIDine (CATAPRES) 0.1 MG tablet, Take 1 tablet (0.1 mg total) by mouth 2 (two) times daily. .  prazosin (MINIPRESS) 1 MG capsule, Take 1 capsule (1 mg total) by mouth at bedtime.    Current Facility-Administered Medications (Analgesics):  .  acetaminophen (TYLENOL) tablet 650 mg     Current Facility-Administered Medications  (Other):  .  alum & mag hydroxide-simeth (MAALOX/MYLANTA) 200-200-20 MG/5ML suspension 30 mL .  gabapentin (NEURONTIN) capsule 600 mg .  hydrOXYzine (ATARAX/VISTARIL) tablet 50 mg .  hydrOXYzine (ATARAX/VISTARIL) tablet 50 mg .  loperamide (IMODIUM) capsule 2 mg .  magnesium hydroxide (MILK OF MAGNESIA) suspension 30 mL .  multivitamin with minerals tablet 1 tablet .  nicotine (NICODERM CQ - dosed in mg/24 hours) patch 21 mg .  ondansetron (ZOFRAN-ODT) disintegrating tablet 4 mg .  PARoxetine (PAXIL) tablet 40 mg  Current Outpatient Prescriptions (Other):  .  gabapentin (NEURONTIN) 300 MG capsule, Take 2 capsules (600 mg total) by mouth 3 (three) times daily. .  hydrOXYzine (ATARAX/VISTARIL) 50 MG tablet, Take 1 tablet (50 mg total) by mouth every 6 (six) hours as needed for anxiety (or insomnia). .  nicotine (NICODERM CQ - DOSED IN MG/24 HOURS) 21 mg/24hr patch, Place 1 patch (21 mg total) onto the skin daily as needed (Nicotine Craving). Marland Kitchen.  PARoxetine (PAXIL) 40 MG tablet, Take 1 tablet (40 mg total) by mouth at bedtime.  Vital Signs:Blood pressure 107/76, pulse 88, temperature 98.1 F (36.7 C), temperature source Oral, resp. rate 16, height 5\' 1"  (1.549 m), weight 90.7 kg (200 lb), last menstrual period 11/27/2015, SpO2 97 %.    Lab Results: No results found for this or any previous visit (from the past 48 hour(s)).  Physical Findings: AIMS: Facial and Oral Movements Muscles of Facial Expression: None, normal Lips and Perioral Area: None, normal Jaw: None, normal Tongue: None, normal,Extremity Movements Upper (arms, wrists, hands, fingers): None, normal Lower (legs, knees, ankles, toes): None, normal, Trunk Movements Neck, shoulders, hips: None, normal, Overall Severity Severity of abnormal movements (highest score from questions above): None, normal Incapacitation  due to abnormal movements: None, normal Patient's awareness of abnormal movements (rate only patient's report): No  Awareness, Dental Status Current problems with teeth and/or dentures?: No Does patient usually wear dentures?: No  CIWA:  CIWA-Ar Total: 1 COWS:  COWS Total Score: 0   Assessment/Plan: Patient had shown some destabilization with discharge however was accepted for a bed tomorrow at ongoing inpatient rehabilitation and it would appear that it would be the best course of action for her treatment and stability to have her wait and be discharged tomorrow directly to the facility and the patient is in agreement with that plan.  Acquanetta SitElizabeth Woods Djuan Talton, MD 04/18/2016, 3:11 PM

## 2016-04-18 NOTE — BHH Suicide Risk Assessment (Deleted)
[facility]   Principal Problem: Opioid use disorder, severe, dependence [facility](HCC) Discharge Diagnoses:  Patient Active Problem List   Diagnosis Date Noted  . Opioid use disorder, severe, dependence (HCC) [F11.20] 04/11/2016  . Substance induced mood disorder (HCC) [F19.94] 04/10/2016  . UTI (urinary tract infection) [N39.0] 12/07/2014  . Alcohol dependence with alcohol-induced mood disorder (HCC) [F10.24]   . Major depressive disorder, recurrent episode, severe (HCC) [F33.2] 12/04/2014  . Opioid use disorder, moderate, dependence (HCC) [F11.20] 12/04/2014  . Alcohol use disorder, severe, dependence (HCC) [F10.20] 11/12/2014  . Alcohol abuse [F10.10] 09/10/2013  . Cocaine abuse [F14.10] 09/10/2013    Total Time spent with patient: 15 minutes  Musculoskeletal: Strength & Muscle Tone: within normal limits Gait & Station: normal Patient leans: N/A  Psychiatric Specialty Exam: ROS  Blood pressure 107/76, pulse 88, temperature 98.1 F (36.7 C), temperature source Oral, resp. rate 16, height 5\' 1"  (1.549 m), weight 90.7 kg (200 lb), last menstrual period 11/27/2015, SpO2 97 %.Body mass index is 37.79 kg/m.  General Appearance: Casual  Eye Contact::  Good  Speech:  Clear and Coherent409  Volume:  Normal  Mood:  Anxious  Affect:  Appropriate and Congruent  Thought Process:  Coherent  Orientation:  Full (Time, Place, and Person)  Thought Content:  Negative  Suicidal Thoughts:  Yes.  without intent/plan  Homicidal Thoughts:  No  Memory:  Negative  Judgement:  Fair  Insight:  Fair  Psychomotor Activity:  Normal  Concentration:  Good  Recall:  Good  Fund of Knowledge:Good  Language: Good  Akathisia:  No  Handed:  Right  AIMS (if indicated):     Assets:  Resilience  Sleep:  Number of Hours: 6.75  Cognition: WNL  ADL's:  Intact   Mental Status Per Nursing Assessment::   On Admission:  Suicidal ideation indicated by patient, Self-harm thoughts,  Self-harm behaviors  Demographic Factors:  Caucasian  Loss Factors: Financial problems/change in socioeconomic status  Historical Factors: Family history of mental illness or substance abuse  Risk Reduction Factors:   NA  Continued Clinical Symptoms:  Alcohol/Substance Abuse/Dependencies  Cognitive Features That Contribute To Risk:  None    Suicide Risk:  Moderate:  Frequent suicidal ideation with limited intensity, and duration, some specificity in terms of plans, no associated intent, good self-control, limited dysphoria/symptomatology, some risk factors present, and identifiable protective factors, including available and accessible social support.  Follow-up Information    [facility] Follow up on 04/27/2016.   Why:  Patient has screening appt on Oct 30th at 11:45AM for possible admission but forfeited this appt to stay at the hospital for an additional day. Next available screening Nov 9 at 7:45AM. Please bring Photo ID.  Contact information: [facility] [place] [address] [phone]        [facility] .   Why:  Referral faxed:04/12/16. Please call Shayla on Wednesday at 8:15AM to check bed availability.  Contact information: [address] [place], [place] [address] Phone: [phone] Fax: [phone]       [facility] .   Specialty:  Behavioral Health Why:  Walk in between 8am-9am Monday through Friday for hospital follow-up/medication management/assessment for counseling services.  Contact information: [address] [place] [address] [phone]           Plan Of Care/Follow-up recommendations:  Other:  Patient relates that she has chronic ongoing suicidal ideations without immediate plan or intent to act. She expresses some anxiety about  being discharged but know she needs to move on with her treatment and her life. She denies any homicidal ideation, plan or intent. She is encouraged to follow up with her plans to enter a  longer term program for treatment and to continue to abstain from any controlled substances.  Acquanetta SitElizabeth Woods Ana Liaw, MD 04/18/2016, 11:42 AM

## 2016-04-18 NOTE — BHH Suicide Risk Assessment (Signed)
[facility]   Principal Problem: Opioid use disorder, severe, dependence [facility](HCC) Discharge Diagnoses:  Patient Active Problem List   Diagnosis Date Noted  . Opioid use disorder, severe, dependence (HCC) [F11.20] 04/11/2016  . Substance induced mood disorder (HCC) [F19.94] 04/10/2016  . UTI (urinary tract infection) [N39.0] 12/07/2014  . Alcohol dependence with alcohol-induced mood disorder (HCC) [F10.24]   . Major depressive disorder, recurrent episode, severe (HCC) [F33.2] 12/04/2014  . Opioid use disorder, moderate, dependence (HCC) [F11.20] 12/04/2014  . Alcohol use disorder, severe, dependence (HCC) [F10.20] 11/12/2014  . Alcohol abuse [F10.10] 09/10/2013  . Cocaine abuse [F14.10] 09/10/2013    Total Time spent with patient: 15 minutes  Musculoskeletal: Strength & Muscle Tone: within normal limits Gait & Station: normal Patient leans: N/A  Psychiatric Specialty Exam: ROS  Blood pressure 107/76, pulse 88, temperature 98.1 F (36.7 C), temperature source Oral, resp. rate 16, height 5\' 1"  (1.549 m), weight 90.7 kg (200 lb), last menstrual period 11/27/2015, SpO2 97 %.Body mass index is 37.79 kg/m.  General Appearance: Casual  Eye Contact::  Good  Speech:  Clear and Coherent409  Volume:  Normal  Mood:  Anxious  Affect:  Congruent  Thought Process:  Coherent  Orientation:  Full (Time, Place, and Person)  Thought Content:  Negative  Suicidal Thoughts:  Yes.  without intent/plan  Homicidal Thoughts:  No  Memory:  Negative  Judgement:  Fair  Insight:  Fair  Psychomotor Activity:  Normal  Concentration:  Good  Recall:  Good  Fund of Knowledge:Good  Language: Good  Akathisia:  No  Handed:  Right  AIMS (if indicated):     Assets:  Resilience  Sleep:  Number of Hours: 6.75  Cognition: WNL  ADL's:  Intact   Mental Status Per Nursing Assessment::   On Admission:  Suicidal ideation indicated by patient, Self-harm thoughts, Self-harm  behaviors  Demographic Factors:  Caucasian, Low socioeconomic status and Unemployed  Loss Factors: Legal issues  Historical Factors: Family history of mental illness or substance abuse  Risk Reduction Factors:   Positive coping skills or problem solving skills  Continued Clinical Symptoms:  Alcohol/Substance Abuse/Dependencies  Cognitive Features That Contribute To Risk:  None    Suicide Risk:  Mild:  Suicidal ideation of limited frequency, intensity, duration, and specificity.  There are no identifiable plans, no associated intent, mild dysphoria and related symptoms, good self-control (both objective and subjective assessment), few other risk factors, and identifiable protective factors, including available and accessible social support.  Follow-up Information    [facility] Follow up on 04/27/2016.   Why:  Patient has screening appt on Oct 30th at 11:45AM for possible admission but forfeited this appt to stay at the hospital for an additional day. Next available screening Nov 9 at 7:45AM. Please bring Photo ID.  Contact information: [facility] [place] [address] [phone]        [facility] .   Why:  You have been accepted for treatment for Nov 1st. [facility] will transport you directly to their facility at 12:15PM.  Contact information: [address] [place], [place] [address] Phone: [phone] Fax: [phone]       [facility] .   Specialty:  Behavioral Health Why:  Walk in between 8am-9am Monday through Friday for hospital follow-up/medication management/assessment for counseling services.  Contact information: [address] [place] [address] [phone]           Plan Of Care/Follow-up recommendations:  Other:  Patient reports issues with anxiety, self-esteem and chronic suicidal ideations but no plan or intent to act at present. Denies any homicidal ideation, plan or intent. Plan is for patient to discharge directly to ongoing  residential treatment program for substance use disorder and it is recommended that she complete the program and stay in follow-up.  Acquanetta SitElizabeth Woods Oates, MD 04/18/2016, 3:15 PM

## 2016-04-18 NOTE — Progress Notes (Signed)
  [facility] :  Will you be returning to the same living situation after discharge:  Yes,  home At discharge, do you have transportation home?: Yes,  bus Do you have the ability to pay for your medications: Yes,  mental health  Release of information consent forms completed and submitted to medical records by CSW.   Patient to Follow up at: Follow-up Information    [facility] Follow up on 04/27/2016.   Why:  Patient has screening appt on Oct 30th at 11:45AM for possible admission but forfeited this appt to stay at the hospital for an additional day. Next available screening Nov 9 at 7:45AM. Please bring Photo ID.  Contact information: [facility] [place] [address] [phone]        [facility] .   Why:  Referral faxed:04/12/16. Please call Shayla on Wednesday at 8:15AM to check bed availability.  Contact information: [address] [place], [place] [address] Phone: [phone] Fax: [phone]       [facility] .   Specialty:  Behavioral Health Why:  Walk in between 8am-9am Monday through Friday for hospital follow-up/medication management/assessment for counseling services.  Contact information: [address] [place] [address] [phone]           Next level of care provider [facility] Link:no  Safety Planning and Suicide Prevention discussed: Yes,  SPE completed with pt's aunt and with pt.  Have you used any form of tobacco in the last 30 days? (Cigarettes, Smokeless Tobacco, Cigars, and/or Pipes): Yes  Has patient been referred to the Quitline?: Patient refused referral  Patient has been referred for addiction treatment: Yes  Lashunda Greis N Smart LCSW 04/18/2016, 9:01 AM

## 2016-04-18 NOTE — Progress Notes (Signed)
Adult Psychoeducational Group Note  Date:  04/18/2016 Time:  9:49 PM  Group Topic/Focus:  Wrap-Up Group:   The focus of this group is to help patients review their daily goal of treatment and discuss progress on daily workbooks.   Participation Level:  Active  Participation Quality:  Appropriate  Affect:  Appropriate  Cognitive:  Alert  Insight: Appropriate  Engagement in Group:  Engaged  Modes of Intervention:  Discussion  Additional Comments:   Patients goal today was to get accepted into [facility]. Patient met her goal and is getting discharged tomorrow afternoon. Patient states she is a 5 out of 10.  Alyssa Kelley L Clodfelter-Simmons 04/18/2016, 9:49 PM

## 2016-04-18 NOTE — Progress Notes (Signed)
Slept 6.25 hours  

## 2016-04-19 NOTE — Progress Notes (Signed)
D:  Patient awake and alert; oriented x 4; she denies suicidal and homicidal ideation and AVH; no self-injurous behaviors noted or reported. She reports that she has generalized pain and nausea A:  Medications given as scheduled;  Prn medications given for pain/nausea;  Emotional support provided; encouraged her to seek assistance with needs/concerns. R:  Safety maintained on unit.

## 2016-04-19 NOTE — Progress Notes (Signed)
Recreation Therapy Notes  Date: 04/19/16 Time: 0930 Location: 300 [address] Dayroom  Group Topic: Stress Management  Goal Area(s) Addresses:  Patient will verbalize importance of using healthy stress management.  Patient will identify positive emotions associated with healthy stress management.   Behavioral Response: Engaged  Intervention: Calm App  Activity :  Letting Go Meditation.  LRT introduced the stress management technique of meditation.  LRT played a meditation on letting go from the Callm App to get patients engaged in the activity.  Patients were to follow along with the meditation as it played.  Education:  Stress Management, Discharge Planning.   Education Outcome: Acknowledges edcuation/In group clarification offered/Needs additional education  Clinical Observations/Feedback: Pt attended group.    Caroll RancherMarjette Aerilyn Slee, LRT/CTRS     Lillia AbedLindsay, Rhyan Radler A 04/19/2016 11:07 AM

## 2016-04-19 NOTE — Progress Notes (Signed)
  [facility] :  Will you be returning to the same living situation after discharge:  No. [facility]  At discharge, do you have transportation home?: Yes,  [facility] transport coming at 12:15PM today. Do you have the ability to pay for your medications: Yes,  mental health  Release of information consent forms completed and submitted to medical records by CSW.  Patient to Follow up at: Follow-up Information    [facility] Follow up on 04/27/2016.   Why:  Patient has screening appt on Oct 30th at 11:45AM for possible admission but forfeited this appt to stay at the hospital for an additional day. Next available screening Nov 9 at 7:45AM. Please bring Photo ID.  Contact information: [facility] [address] [place] [address] [phone]        [facility] .   Why:  You have been accepted for treatment for Nov 1st. [facility] will transport you directly to their facility at 12:15PM.  Contact information: [address] [place], [place] [address] Phone: [phone] Fax: [phone]       [facility] .   Specialty:  Behavioral Health Why:  Walk in between 8am-9am Monday through Friday for hospital follow-up/medication management/assessment for counseling services.  Contact information: [address] [place] [address] [phone]           Next level of care provider has access to [facility] Link:no  Safety Planning and Suicide Prevention discussed: Yes,  SPE completed with pt's aunt.  Have you used any form of tobacco in the last 30 days? (Cigarettes, Smokeless Tobacco, Cigars, and/or Pipes): Yes  Has patient been referred to the Quitline?: Patient refused referral  Patient has been referred for addiction treatment: Yes  Brianca Fortenberry N Smart LCSW 04/19/2016, 8:42 AM

## 2016-04-19 NOTE — Progress Notes (Signed)
D: Pt denies SI/HI/AVH. Pt is pleasant and cooperative. Pt stated she was doing a lot better. Pt [facility]  A: Pt was offered support and encouragement. Pt was given scheduled medications. Pt was encourage to attend groups. Q 15 minute checks were done for safety.   R:Pt attends groups and interacts well with peers and staff. Pt is taking medication. Pt has no complaints.Pt receptive to treatment and safety maintained on unit.

## 2016-04-19 NOTE — Progress Notes (Signed)
Written/verbal discharge instructions, AVS, SRA, transition report, prescriptions, sample medications and follow-up information given to patient with verbalization of understanding;  Patient denies suicidal and homicidal ideation. Suicide Prevention information/materials given to patient  All patient belongings returned to patient at time of discharge. Discharged in stable condition. [facility] personnel here to transport patient to their facility.

## 2016-04-19 NOTE — Tx Team (Signed)
Interdisciplinary Treatment and Diagnostic Plan Update  04/19/2016 Time of Session: 9:30AM Alyssa Kelley MRN: [id number]  Principal Diagnosis: Opioid use disorder, severe, dependence ([place])  Secondary Diagnoses: Principal Problem:   Opioid use disorder, severe, dependence ([place]) Active Problems:   Substance induced mood disorder ([place])   Current Medications:  Current Facility-Administered Medications  Medication Dose Route Frequency Provider Last Rate Last Dose  . acetaminophen (TYLENOL) tablet 650 mg  650 mg Oral Q6H PRN Rozetta Nunnery, NP   650 mg at 04/19/16 0804  . alum & mag hydroxide-simeth (MAALOX/MYLANTA) 200-200-20 MG/5ML suspension 30 mL  30 mL Oral Q4H PRN Rozetta Nunnery, NP   30 mL at 04/10/16 2116  . cloNIDine (CATAPRES) tablet 0.1 mg  0.1 mg Oral BID Linard Millers, MD   0.1 mg at 04/19/16 0805  . gabapentin (NEURONTIN) capsule 600 mg  600 mg Oral TID Norman Clay, MD   600 mg at 04/19/16 0805  . hydrOXYzine (ATARAX/VISTARIL) tablet 50 mg  50 mg Oral TID PRN Linard Millers, MD   50 mg at 04/18/16 1147  . hydrOXYzine (ATARAX/VISTARIL) tablet 50 mg  50 mg Oral QHS PRN Benjamine Mola, FNP   50 mg at 04/18/16 2341  . loperamide (IMODIUM) capsule 2 mg  2 mg Oral Q6H PRN Benjamine Mola, FNP   2 mg at 04/13/16 1829  . magnesium hydroxide (MILK OF MAGNESIA) suspension 30 mL  30 mL Oral Daily PRN Rozetta Nunnery, NP      . multivitamin with minerals tablet 1 tablet  1 tablet Oral Daily Rozetta Nunnery, NP   1 tablet at 04/19/16 0805  . nicotine (NICODERM CQ - dosed in mg/24 hours) patch 21 mg  21 mg Transdermal Daily Kerrie Buffalo, NP   21 mg at 04/19/16 0804  . ondansetron (ZOFRAN-ODT) disintegrating tablet 4 mg  4 mg Oral Q8H PRN Benjamine Mola, FNP   4 mg at 04/19/16 0804  . PARoxetine (PAXIL) tablet 40 mg  40 mg Oral QHS Linard Millers, MD   40 mg at 04/18/16 2141  . prazosin (MINIPRESS) capsule 1 mg  1 mg Oral QHS Rozetta Nunnery, NP   1 mg at 04/18/16 2342   PTA  Medications: Prescriptions Prior to Admission  Medication Sig Dispense Refill Last Dose  . doxepin (SINEQUAN) 25 MG capsule Take 25 mg by mouth at bedtime.   04/08/2016 at Unknown time  . gabapentin (NEURONTIN) 300 MG capsule Take 1 capsule (300 mg total) by mouth 2 (two) times daily. (Patient taking differently: Take 300 mg by mouth 3 (three) times daily. ) 60 capsule 0 04/08/2016 at Unknown time  . hydrOXYzine (ATARAX/VISTARIL) 25 MG tablet Take 1 tablet (25 mg total) by mouth 3 (three) times daily. 90 tablet 0 04/08/2016 at Unknown time  . PARoxetine (PAXIL) 30 MG tablet Take 30 mg by mouth daily.   04/08/2016 at Unknown time  . prazosin (MINIPRESS) 1 MG capsule Take 1 capsule (1 mg total) by mouth at bedtime. 30 capsule 0 04/08/2016 at Unknown time  . propranolol (INDERAL) 10 MG tablet Take 1 tablet (10 mg total) by mouth 3 (three) times daily. For anxiety 90 tablet 0 04/08/2016 at Unknown time  . albuterol (PROVENTIL HFA;VENTOLIN HFA) 108 (90 Base) MCG/ACT inhaler Inhale 2 puffs into the lungs every 4 (four) hours as needed for wheezing or shortness of breath. (Patient not taking: Reported on 04/11/2016) 1 Inhaler 1 Not Taking at Unknown time  Patient Stressors: Financial difficulties Substance abuse  Patient Strengths: Ability for Estate manager/land agent for treatment/growth Physical Health  Treatment Modalities: Medication Management, Group therapy, Case management,  1 to 1 session with clinician, Psychoeducation, Recreational therapy.   Physician Treatment Plan for Primary Diagnosis: Opioid use disorder, severe, dependence ([place]) Long Term Goal(s): Improvement in symptoms so as ready for discharge Improvement in symptoms so as ready for discharge   Short Term Goals: Ability to disclose and discuss suicidal ideas Ability to demonstrate self-control will improve Ability to identify changes in lifestyle to reduce recurrence of condition will improve Ability to  demonstrate self-control will improve Ability to identify and develop effective coping behaviors will improve Compliance with prescribed medications will improve Ability to identify triggers associated with substance abuse/mental health issues will improve  Medication Management: Evaluate patient's response, side effects, and tolerance of medication regimen.  Therapeutic Interventions: 1 to 1 sessions, Unit Group sessions and Medication administration.  Evaluation of Outcomes: Adequate for discharge   Physician Treatment Plan for Secondary Diagnosis: Principal Problem:   Opioid use disorder, severe, dependence ([place]) Active Problems:   Substance induced mood disorder ([place])  Long Term Goal(s): Improvement in symptoms so as ready for discharge Improvement in symptoms so as ready for discharge   Short Term Goals: Ability to disclose and discuss suicidal ideas Ability to demonstrate self-control will improve Ability to identify changes in lifestyle to reduce recurrence of condition will improve Ability to demonstrate self-control will improve Ability to identify and develop effective coping behaviors will improve Compliance with prescribed medications will improve Ability to identify triggers associated with substance abuse/mental health issues will improve     Medication Management: Evaluate patient's response, side effects, and tolerance of medication regimen.  Therapeutic Interventions: 1 to 1 sessions, Unit Group sessions and Medication administration.  Evaluation of Outcomes: Met   RN Treatment Plan for Primary Diagnosis: Opioid use disorder, severe, dependence ([place]) Long Term Goal(s): Knowledge of disease and therapeutic regimen to maintain health will improve  Short Term Goals: Ability to remain free from injury will improve, Ability to disclose and discuss suicidal ideas and Ability to identify and develop effective coping behaviors will improve  Medication Management: RN  will administer medications as ordered by provider, will assess and evaluate patient's response and provide education to patient for prescribed medication. RN will report any adverse and/or side effects to prescribing provider.  Therapeutic Interventions: 1 on 1 counseling sessions, Psychoeducation, Medication administration, Evaluate responses to treatment, Monitor vital signs and CBGs as ordered, Perform/monitor CIWA, COWS, AIMS and Fall Risk screenings as ordered, Perform wound care treatments as ordered.  Evaluation of Outcomes: Met   LCSW Treatment Plan for Primary Diagnosis: Opioid use disorder, severe, dependence ([place]) Long Term Goal(s): Safe transition to appropriate next level of care at discharge, Engage patient in therapeutic group addressing interpersonal concerns.  Short Term Goals: Engage patient in aftercare planning with referrals and resources, Facilitate patient progression through stages of change regarding substance use diagnoses and concerns and Identify triggers associated with mental health/substance abuse issues  Therapeutic Interventions: Assess for all discharge needs, 1 to 1 time with Social worker, Explore available resources and support systems, Assess for adequacy in community support network, Educate family and significant other(s) on suicide prevention, Complete Psychosocial Assessment, Interpersonal group therapy.  Evaluation of Outcomes:Adequate for discharge    Progress in Treatment: Attending groups: Yes. Participating in groups: Yes. Taking medication as prescribed: Yes. Toleration medication: Yes. Family/Significant other contact made: SPE completed with  pt's aunt.  Patient understands diagnosis: Yes. Discussing patient identified problems/goals with staff: Yes. Medical problems stabilized or resolved: Yes. Denies suicidal/homicidal ideation: Yes. Issues/concerns per patient self-inventory: No. Other: n/a  New problem(s) identified: No, Describe:   n/a  New Short Term/Long Term Goal(s):  Discharge Plan or Barriers: [facility] taking patient for today. 12:15PM pickup. 21 day supply needed.   Reason for Continuation of Hospitalization: none  Estimated Length of Stay: d/c scheduled for today 04/19/2016   Attendees: Patient: 04/19/2016 8:43 AM  Physician: Dr. Sharolyn Douglas MD 04/19/2016 8:43 AM  Nursing: Michaelene Song RN 04/19/2016 8:43 AM  RN Care Manager: Lars Pinks CM 04/19/2016 8:43 AM  Social Worker: Maxie Better, LCSW 04/19/2016 8:43 AM  Recreational Therapist:  04/19/2016 8:43 AM  Other: May Fransico Michael NP 04/19/2016 8:43 AM  Other:  04/19/2016 8:43 AM  Other: 04/19/2016 8:43 AM    Scribe for Treatment Team: [place], LCSW 04/19/2016 8:43 AM

## 2016-04-27 NOTE — Congregational Nurse Program (Signed)
Congregational Nurse Program Note  Date of Encounter: 03/21/2016  Past Medical History: Past Medical History:  Diagnosis Date  . Abscess of mouth   . Alcohol abuse   . Anxiety   . Benzodiazepine abuse   . Cocaine abuse   . Depression   . Hepatitis C   . Heroin abuse   . Hypertension   . 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 Is currently in methadone program. States she feels like she's having withdrawal problems. Requests BP check.

## 2016-05-19 IMAGING — CR DG CHEST 2V
2 series · 2 of 2 positions shown · non-contrast
Comparison: 12/23/2013

CLINICAL DATA: Acute cough, congestion, shortness of breath

EXAM:
CHEST - 2 VIEW

[w chest pa]
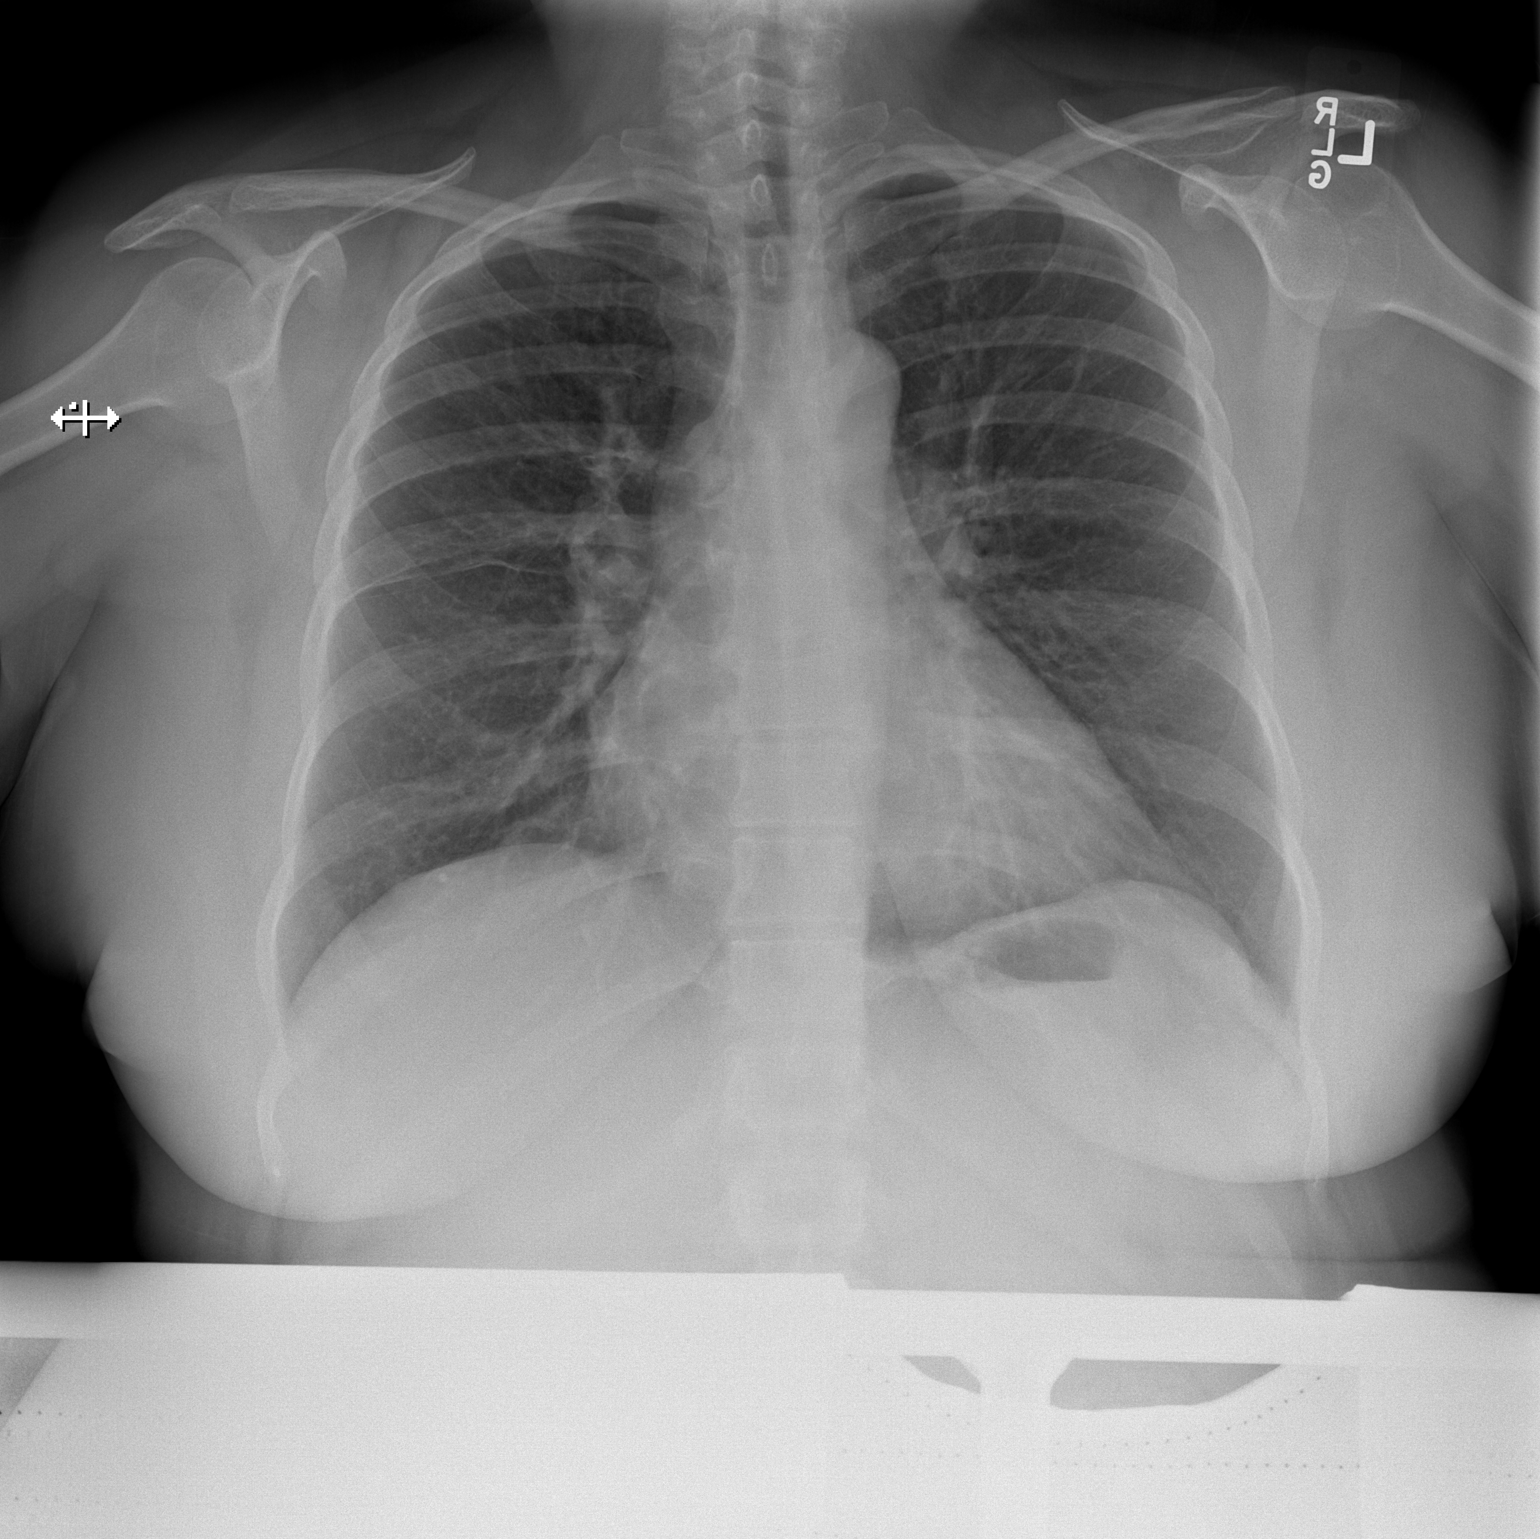

[w chest lat]
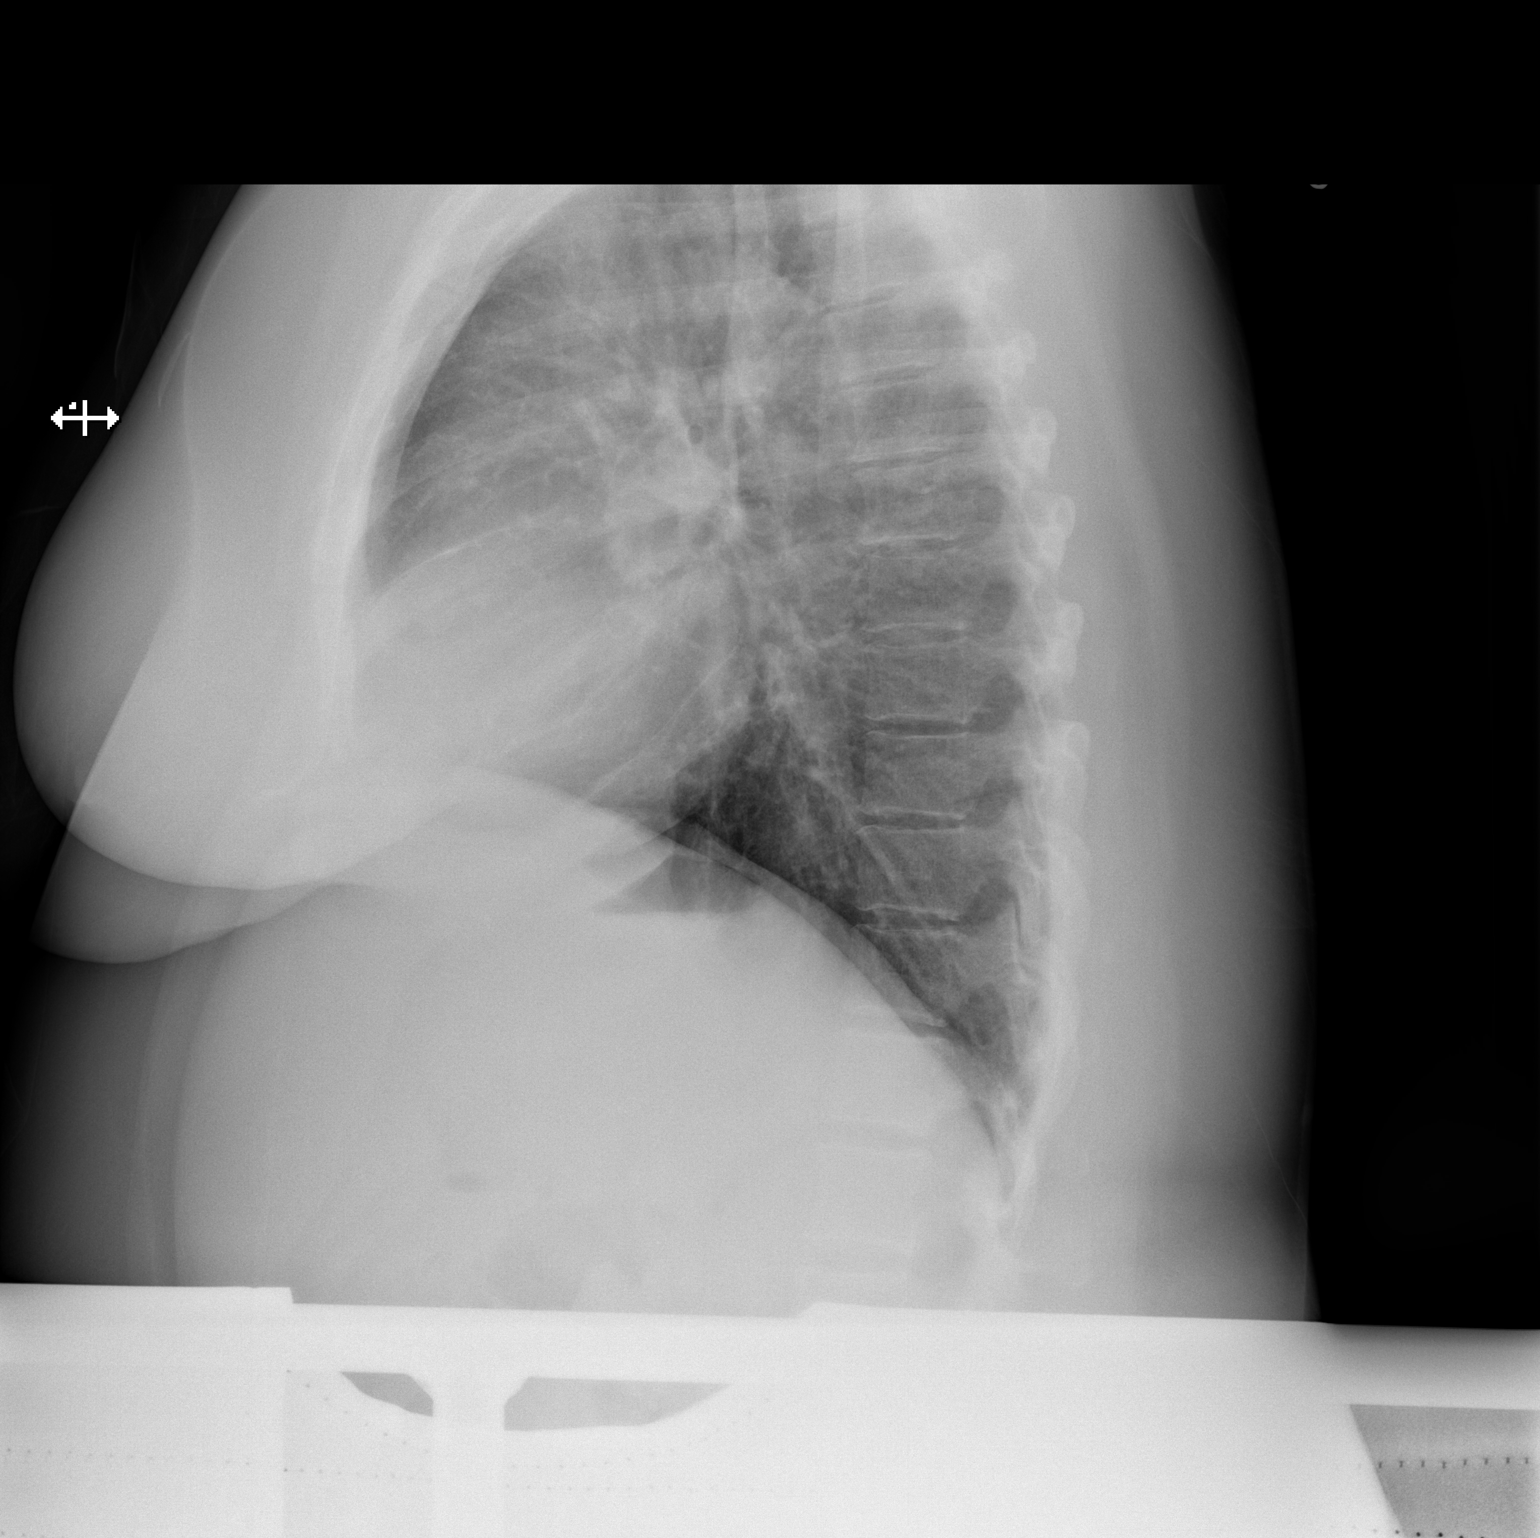

[2 of 2 positions shown; findings below may reference images not displayed]

FINDINGS: Borderline heart size but normal vascularity. Negative for CHF or
edema. No effusion or pneumothorax. Minor right midlung scarring. No
current pneumonia, collapse or consolidation. Trachea is midline. No
acute osseous finding.
IMPRESSION: No acute chest process.

Right perihilar scarring.

## 2016-06-06 ENCOUNTER — Other Ambulatory Visit: Payer: Self-pay | Admitting: Obstetrics and Gynecology

## 2016-06-06 DIAGNOSIS — Z1231 Encounter for screening mammogram for malignant neoplasm of breast: Secondary | ICD-10-CM

## 2016-07-06 ENCOUNTER — Ambulatory Visit (HOSPITAL_COMMUNITY): Payer: Self-pay

## 2016-08-10 ENCOUNTER — Ambulatory Visit (HOSPITAL_COMMUNITY): Payer: Self-pay

## 2016-09-07 ENCOUNTER — Ambulatory Visit (HOSPITAL_COMMUNITY): Payer: Self-pay

## 2017-01-31 ENCOUNTER — Encounter (HOSPITAL_COMMUNITY): Payer: Self-pay

## 2017-01-31 ENCOUNTER — Emergency Department (HOSPITAL_COMMUNITY)
Admission: EM | Admit: 2017-01-31 | Discharge: 2017-02-01 | Disposition: A | Discharge status: Other Acute Inpatient Hospital | Payer: Self-pay | Attending: Emergency Medicine | Admitting: Emergency Medicine

## 2017-01-31 DIAGNOSIS — F1010 Alcohol abuse, uncomplicated: Secondary | ICD-10-CM | POA: Insufficient documentation

## 2017-01-31 DIAGNOSIS — Z87891 Personal history of nicotine dependence: Secondary | ICD-10-CM | POA: Insufficient documentation

## 2017-01-31 DIAGNOSIS — I10 Essential (primary) hypertension: Secondary | ICD-10-CM | POA: Insufficient documentation

## 2017-01-31 DIAGNOSIS — Z79899 Other long term (current) drug therapy: Secondary | ICD-10-CM | POA: Insufficient documentation

## 2017-01-31 DIAGNOSIS — F1910 Other psychoactive substance abuse, uncomplicated: Secondary | ICD-10-CM | POA: Insufficient documentation

## 2017-01-31 DIAGNOSIS — R45851 Suicidal ideations: Secondary | ICD-10-CM

## 2017-01-31 DIAGNOSIS — F329 Major depressive disorder, single episode, unspecified: Secondary | ICD-10-CM | POA: Insufficient documentation

## 2017-01-31 LAB — CBC
HEMATOCRIT: 44.3 % (ref 36.0–46.0)
Hemoglobin: 15.1 g/dL — ABNORMAL HIGH (ref 12.0–15.0)
MCH: 29.2 pg (ref 26.0–34.0)
MCHC: 34.1 g/dL (ref 30.0–36.0)
MCV: 85.7 fL (ref 78.0–100.0)
Platelets: 115 10*3/uL — ABNORMAL LOW (ref 150–400)
RBC: 5.17 MIL/uL — ABNORMAL HIGH (ref 3.87–5.11)
RDW: 13.8 % (ref 11.5–15.5)
WBC: 6.8 10*3/uL (ref 4.0–10.5)

## 2017-01-31 LAB — COMPREHENSIVE METABOLIC PANEL
ALT: 49 U/L (ref 14–54)
ANION GAP: 15 (ref 5–15)
AST: 78 U/L — ABNORMAL HIGH (ref 15–41)
Albumin: 3.7 g/dL (ref 3.5–5.0)
Alkaline Phosphatase: 73 U/L (ref 38–126)
BILIRUBIN TOTAL: 1.4 mg/dL — AB (ref 0.3–1.2)
BUN: 7 mg/dL (ref 6–20)
CALCIUM: 9.4 mg/dL (ref 8.9–10.3)
CO2: 18 mmol/L — ABNORMAL LOW (ref 22–32)
Chloride: 104 mmol/L (ref 101–111)
Creatinine, Ser: 0.7 mg/dL (ref 0.44–1.00)
GFR calc Af Amer: >60 mL/min (ref >60)
Glucose, Bld: 111 mg/dL — ABNORMAL HIGH (ref 65–99)
POTASSIUM: 4.1 mmol/L (ref 3.5–5.1)
Sodium: 137 mmol/L (ref 135–145)
TOTAL PROTEIN: 8.3 g/dL — AB (ref 6.5–8.1)

## 2017-01-31 LAB — RAPID URINE DRUG SCREEN, HOSP PERFORMED
Amphetamines: NOT DETECTED
BARBITURATES: NOT DETECTED
BENZODIAZEPINES: NOT DETECTED
Cocaine: POSITIVE — AB
Opiates: NOT DETECTED
Tetrahydrocannabinol: NOT DETECTED

## 2017-01-31 LAB — SALICYLATE LEVEL: Salicylate Lvl: <7 mg/dL (ref 2.8–30.0)

## 2017-01-31 LAB — ACETAMINOPHEN LEVEL

## 2017-01-31 LAB — ETHANOL: Alcohol, Ethyl (B): <5 mg/dL (ref <5)

## 2017-01-31 MED ORDER — LORAZEPAM 1 MG PO TABS: 0.0000 mg | ORAL_TABLET | Freq: Two times a day (BID) | ORAL | Status: DC

## 2017-01-31 MED ORDER — VITAMIN B-1 100 MG PO TABS
100.0000 mg | ORAL_TABLET | Freq: Every day | ORAL | Status: DC
  Filled 2017-01-31: qty 1

## 2017-01-31 MED ORDER — LORAZEPAM 2 MG/ML IJ SOLN
0.0000 mg | Freq: Two times a day (BID) | INTRAMUSCULAR | Status: DC
Start: 2017-02-03 — End: 2017-02-01

## 2017-01-31 MED ORDER — IBUPROFEN 400 MG PO TABS
600.0000 mg | ORAL_TABLET | Freq: Four times a day (QID) | ORAL | Status: DC | PRN
  Administered 2017-01-31: 600 mg via ORAL
  Filled 2017-01-31: qty 1

## 2017-01-31 MED ORDER — HYDROXYZINE HCL 50 MG PO TABS: 50.0000 mg | ORAL_TABLET | Freq: Four times a day (QID) | ORAL | Status: DC | PRN

## 2017-01-31 MED ORDER — ONDANSETRON 4 MG PO TBDP
4.0000 mg | ORAL_TABLET | Freq: Three times a day (TID) | ORAL | Status: DC | PRN
  Administered 2017-01-31: 4 mg via ORAL
  Filled 2017-01-31: qty 1

## 2017-01-31 MED ORDER — LORAZEPAM 2 MG/ML IJ SOLN: 0.0000 mg | Freq: Four times a day (QID) | INTRAMUSCULAR | Status: DC

## 2017-01-31 MED ORDER — PRAZOSIN HCL 1 MG PO CAPS
1.0000 mg | ORAL_CAPSULE | Freq: Every day | ORAL | Status: DC
  Administered 2017-01-31: 1 mg via ORAL
  Filled 2017-01-31: qty 1

## 2017-01-31 MED ORDER — LORAZEPAM 1 MG PO TABS
0.0000 mg | ORAL_TABLET | Freq: Four times a day (QID) | ORAL | Status: DC
  Administered 2017-01-31: 1 mg via ORAL
  Filled 2017-01-31: qty 1

## 2017-01-31 MED ORDER — PAROXETINE HCL 20 MG PO TABS
40.0000 mg | ORAL_TABLET | Freq: Every day | ORAL | Status: DC
  Administered 2017-01-31: 40 mg via ORAL
  Filled 2017-01-31: qty 2

## 2017-01-31 MED ORDER — CLONIDINE HCL 0.2 MG PO TABS
0.1000 mg | ORAL_TABLET | Freq: Two times a day (BID) | ORAL | Status: DC
  Administered 2017-01-31: 0.1 mg via ORAL
  Filled 2017-01-31: qty 1

## 2017-01-31 MED ORDER — NICOTINE 21 MG/24HR TD PT24: 21.0000 mg | MEDICATED_PATCH | Freq: Every day | TRANSDERMAL | Status: DC | PRN

## 2017-01-31 MED ORDER — THIAMINE HCL 100 MG/ML IJ SOLN: 100.0000 mg | Freq: Every day | INTRAMUSCULAR | Status: DC

## 2017-01-31 MED ORDER — GABAPENTIN 300 MG PO CAPS
600.0000 mg | ORAL_CAPSULE | Freq: Three times a day (TID) | ORAL | Status: DC
  Administered 2017-01-31: 600 mg via ORAL
  Filled 2017-01-31: qty 2

## 2017-01-31 NOTE — ED Triage Notes (Signed)
Pt states she has had suicidal thoughts X1 week. Pt also states she is detoxing from ETOH, methadone, and benzos. Last alcohol drink was last night. Pt aoX4, calm and cooperative.

## 2017-01-31 NOTE — ED Provider Notes (Signed)
[facility]-EMERGENCY DEPT Provider Note   CSN: [id number] Arrival date & time: 01/31/17  1543     History   Chief Complaint Chief Complaint  Patient presents with  . Suicidal  . Alcohol Problem    HPI Alyssa Kelley is a 46 y.o. female.  This a 46 year old female who has a history of drug abuse and alcohol abuse who presents with suicidal ideation.  She states she last took her Suboxone yesterday.  Last drank yesterday as well as any over-the-counter/Street benzo.  She drinks approximately 12 beers daily  She does have a history of depression and anxiety.  She has not seen a therapist every year.  She's had 2 previous behavioral health hospitalizations.  Proximally, one year.  Part      Past Medical History:  Diagnosis Date  . Abscess of mouth   . Alcohol abuse   . Anxiety   . Benzodiazepine abuse   . Cocaine abuse   . Depression   . Hepatitis C   . Heroin abuse   . Hypertension   . Methadone use [facility](HCC)     Patient Active Problem List   Diagnosis Date Noted  . Opioid use disorder, severe, dependence ([facility]) 04/11/2016  . Substance induced mood disorder ([facility]) 04/10/2016  . UTI (urinary tract infection) 12/07/2014  . Alcohol dependence with alcohol-induced mood disorder ([facility])   . Major depressive disorder, recurrent episode, severe ([facility]) 12/04/2014  . Opioid use disorder, moderate, dependence ([facility]CC) 12/04/2014  . Alcohol use disorder, severe, dependence ([facility]) 11/12/2014  . Alcohol abuse 09/10/2013  . Cocaine abuse 09/10/2013    Past Surgical History:  Procedure Laterality Date  . CESAREAN SECTION      OB History    No data available       Home Medications    Prior to Admission medications   Medication Sig Start Date End Date Taking? Authorizing Provider  cloNIDine (CATAPRES) 0.1 MG tablet Take 1 tablet (0.1 mg total) by mouth 2 (two) times daily. 04/17/16  Yes Withrow, Everardo AllJohn C, FNP  gabapentin (NEURONTIN) 300 MG capsule Take 2 capsules (600 mg total) by mouth 3  (three) times daily. 04/17/16  Yes Withrow, Everardo AllJohn C, FNP  hydrOXYzine (ATARAX/VISTARIL) 50 MG tablet Take 1 tablet (50 mg total) by mouth every 6 (six) hours as needed for anxiety (or insomnia). 04/18/16  Yes Withrow, Everardo AllJohn C, FNP  nicotine (NICODERM CQ - DOSED IN MG/24 HOURS) 21 mg/24hr patch Place 1 patch (21 mg total) onto the skin daily as needed (Nicotine Craving). 04/17/16  Yes Withrow, Everardo AllJohn C, FNP  PARoxetine (PAXIL) 40 MG tablet Take 1 tablet (40 mg total) by mouth at bedtime. 04/17/16  Yes Withrow, Everardo AllJohn C, FNP  prazosin (MINIPRESS) 1 MG capsule Take 1 capsule (1 mg total) by mouth at bedtime. 04/17/16  Yes Withrow, Everardo AllJohn C, FNP    Family History History reviewed. No pertinent family history.  Social History Social History  Substance Use Topics  . Smoking status: Current Every Day Smoker    Packs/day: 1.00    Years: 15.00    Types: Cigarettes  . Smokeless tobacco: Never Used  . Alcohol use 50.4 oz/week    84 Cans of beer per week     Comment: 1 12-pack daiy     Allergies   Patient has no known allergies.   Review of Systems Review of Systems  Neurological: Negative for dizziness and headaches.  Psychiatric/Behavioral: Positive for suicidal ideas. The patient is nervous/anxious.   All other systems  reviewed and are negative.    Physical Exam Updated Vital Signs BP (!) 131/94   Pulse 80   Temp 98.4 F (36.9 C) (Oral)   Resp 16   Ht 5\' 1"  (1.549 m)   Wt 81.6 kg (180 lb)   LMP 11/24/2015 (Within Days)   SpO2 99%   BMI 34.01 kg/m   Physical Exam  Constitutional: She appears well-developed and well-nourished.  Eyes: Pupils are equal, round, and reactive to light.  Neck: Normal range of motion.  Cardiovascular: Normal rate.   Pulmonary/Chest: Effort normal.  Abdominal: Soft.  Musculoskeletal: Normal range of motion.  Neurological: She is alert.  Skin: Skin is warm.  Psychiatric: Her speech is normal and behavior is normal. Her mood appears anxious. Cognition  and memory are normal. She expresses impulsivity. She exhibits a depressed mood. She expresses suicidal ideation. She expresses suicidal plans.  Nursing note and vitals reviewed.    ED Treatments / Results  Labs (all labs ordered are listed, but only abnormal results are displayed) Labs Reviewed  COMPREHENSIVE METABOLIC PANEL - Abnormal; Notable for the following:       Result Value   CO2 18 (*)    Glucose, Bld 111 (*)    Total Protein 8.3 (*)    AST 78 (*)    Total Bilirubin 1.4 (*)    All other components within normal limits  ACETAMINOPHEN LEVEL - Abnormal; Notable for the following:    Acetaminophen (Tylenol), Serum <10 (*)    All other components within normal limits  CBC - Abnormal; Notable for the following:    RBC 5.17 (*)    Hemoglobin 15.1 (*)    Platelets 115 (*)    All other components within normal limits  RAPID URINE DRUG SCREEN, HOSP PERFORMED - Abnormal; Notable for the following:    Cocaine POSITIVE (*)    All other components within normal limits  ETHANOL  SALICYLATE LEVEL  I-STAT BETA HCG BLOOD, ED ([facility], [facility], [facility] ONLY)    EKG  EKG Interpretation None       Radiology No results found.  Procedures Procedures (including critical care time)  Medications Ordered in ED Medications  cloNIDine (CATAPRES) tablet 0.1 mg (0.1 mg Oral Given 01/31/17 2326)  gabapentin (NEURONTIN) capsule 600 mg (600 mg Oral Given 01/31/17 2325)  hydrOXYzine (ATARAX/VISTARIL) tablet 50 mg (not administered)  nicotine (NICODERM CQ - dosed in mg/24 hours) patch 21 mg (not administered)  PARoxetine (PAXIL) tablet 40 mg (40 mg Oral Given 01/31/17 2329)  prazosin (MINIPRESS) capsule 1 mg (1 mg Oral Given 01/31/17 2324)  LORazepam (ATIVAN) injection 0-4 mg ( Intravenous See Alternative 01/31/17 2329)    Or  LORazepam (ATIVAN) tablet 0-4 mg (1 mg Oral Given 01/31/17 2329)  LORazepam (ATIVAN) injection 0-4 mg (not administered)    Or  LORazepam (ATIVAN) tablet 0-4 mg (not administered)    thiamine (VITAMIN B-1) tablet 100 mg (not administered)    Or  thiamine (B-1) injection 100 mg (not administered)  ibuprofen (ADVIL,MOTRIN) tablet 600 mg (not administered)  ondansetron (ZOFRAN-ODT) disintegrating tablet 4 mg (not administered)     Initial Impression / Assessment and Plan / ED Course  I have reviewed the triage vital signs and the nursing notes.  Pertinent labs & imaging results that were available during my care of the patient were reviewed by me and considered in my medical decision making (see chart for details).      TTS evaluation  Patient meets criteria for admission  Final Clinical Impressions(s) / ED Diagnoses   Final diagnoses:  Suicidal ideation  Polysubstance abuse  Alcohol abuse    New Prescriptions New Prescriptions   No medications on file     Earley Favor, NP 01/31/17 2205    Earley Favor, NP 01/31/17 2345    Donnetta Hutching, MD 02/03/17 1038

## 2017-01-31 NOTE — BH Assessment (Addendum)
Tele Assessment Note   Alyssa Kelley is an 46 y.o. female who came voluntarily to the [facility] tonight c/o SI with a plan to overdose on medications. Pt sts her depression got worse about 2 weeks ago. Pt sts she is addicted to multiple substances and cannot stop. Pt sts she is taking Xanax (no prescription), smoking Crack Cocaine and drinking alcohol (12 beers or several 40s) each day. Pt sts she has had multiple attempts at Rehab but continues to relapse. Pt denies homicidal ideation, self-harming ideation and auditory/visual hallucinations. Pt sts she is not seeing a psychiatrist or a therapist currently and is not prescribed any psychiatric medications. Pt sts her primary stressor is being homeless and addiction.   Pt sts she has been homeless for about 1 month. No further details were given. Pt sts she has no family support as she I single and her parents died 6 months apart when she was 46 yo. Pt's father was murdered and her mother died of cirrhosis. Pt sts she has struggled with substance use since she was 46 yo. Pt sts she has been psychiatrically hospitalized 2 times at [facility] in 2016 and twice in 2017 for MDD and SA. Pt's symptoms of depression including sadness, fatigue, excessive guilt, decreased self esteem, tearfulness / crying spells, self isolation, lack of motivation for activities and pleasure, irritability, negative outlook, difficulty thinking & concentrating, feeling helpless and hopeless, sleep and eating disturbances. Pt sts she has frequent panic attacks sometime having multiple attacks each day. Pt sts she has been having them at this frequency since she became homeless about 1 month ago. Pt sts she gets about 3 hours of sleep nightly and has had decreased appetite over the last 2 weeks. Pt sts she has no access to guans. Pt sts she has a hx of arrests for misdeamenors such as larceny (shoplifting) and drug possession. Pt denies any hx of verbal or physical violence or aggression. Pt  denies any hx of abuse.   Pt was dressed in scrubs and sitting on her hospital bed. Pt was alert, cooperative and pleasant. Pt kept fair eye contact, spoke in a clear tone and at a normal pace. Pt moved in a normal manner when moving. Pt's thought process was coherent and relevant and judgement was impaired.  No indication of delusional thinking or response to internal stimuli. Pt's mood was stated as depressed and anxious and her blunted affect was congruent.  Pt was oriented x 4, to person, place, time and situation.   Diagnosis: MDD, Recurrent, Severe; Panic D/O by hx; Alcohol Use D/O, Severe; Cocaine Use D/O, Severe; Sedative, Hypnotic or Anxiolytic Use D/O, Severe  Past Medical History:  Past Medical History:  Diagnosis Date  . Abscess of mouth   . Alcohol abuse   . Anxiety   . Benzodiazepine abuse   . Cocaine abuse   . Depression   . Hepatitis C   . Heroin abuse   . Hypertension   . 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 CESAREAN SECTION      Family History: History reviewed. No pertinent family history.  Social History:  reports that she has been smoking Cigarettes.  She has a 15.00 pack-year smoking history. She has never used smokeless tobacco. She reports that she drinks about 50.4 oz of alcohol per week . She reports that she uses drugs, including Cocaine, Oxycodone, Heroin, and Benzodiazepines.  Additional Social History:  Alcohol /  Drug Use Prescriptions: SEE MAR History of alcohol / drug use?: Yes Longest period of sobriety (when/how long): UNKNOWN Substance #1 Name of Substance 1: ALCOHOL 1 - Age of First Use: 16 1 - Amount (size/oz): 12 BEERS  1 - Frequency: DAILY 1 - Duration: ONGOING 1 - Last Use / Amount: 2 DAYS AGO - TRYING TO STOP Substance #2 Name of Substance 2: COCAINE/CRACK 2 - Age of First Use: 16 2 - Amount (size/oz): "AS MUCH AS I CAN" 2 - Frequency: DAILY IF POSSIBLE 2 - Duration: ONGOING 2 - Last Use /  Amount: 01/30/17 Substance #3 Name of Substance 3: XANAX/BENZODIAZEPINES - NO RX 3 - Age of First Use: 16 3 - Amount (size/oz): 10 MG 3 - Frequency: DAILY 3 - Duration: ONGOING 3 - Last Use / Amount: 01/31/17 Substance #4 Name of Substance 4: SUBOXONE 4 - Age of First Use: UNKNOWN 4 - Amount (size/oz): UNKNOWN 4 - Frequency: 4 DAYS LAST WEEK 4 - Duration: ONGOING 4 - Last Use / Amount: LAST WEEK Substance #5 Name of Substance 5: NICOTINE/CIGARETTES 5 - Age of First Use: 16 5 - Amount (size/oz): 1 PACK 5 - Frequency: DAILY 5 - Duration: ONGOING 5 - Last Use / Amount: 01/31/17  CIWA: CIWA-Ar BP: (!) 131/94 Pulse Rate: 80 Nausea and Vomiting: 2 Tactile Disturbances: none Tremor: no tremor Auditory Disturbances: not present Paroxysmal Sweats: three Visual Disturbances: not present Anxiety: three Headache, Fullness in Head: none present Agitation: two Orientation and Clouding of Sensorium: oriented and can do serial additions CIWA-Ar Total: 10 COWS: Clinical Opiate Withdrawal Scale (COWS) Resting Pulse Rate: Pulse Rate 80 or below Sweating: Subjective report of chills or flushing Restlessness: Able to sit still Pupil Size: Pupils pinned or normal size for room light Bone or Joint Aches: Mild diffuse discomfort Runny Nose or Tearing: Not present GI Upset: nausea or loose stool Tremor: No tremor Yawning: No yawning Anxiety or Irritability: Patient reports increasing irritability or anxiousness Gooseflesh Skin: Skin is smooth COWS Total Score: 5  PATIENT STRENGTHS: (choose at least two) Average or above average intelligence Communication skills  Allergies: No Known Allergies  Home Medications:  (Not in a hospital admission)  OB/GYN Status:  Patient's last menstrual period was 11/24/2015 (within days).  General Assessment Data Location of Assessment: [facility] ED TTS Assessment: In system Is this a Tele or Face-to-Face Assessment?: Tele Assessment Is this an Initial  Assessment or a Re-assessment for this encounter?: Initial Assessment Marital status: Single Is patient pregnant?: Unknown Pregnancy Status: Unknown Living Arrangements: Other (Comment) (HOMELESS) Can pt return to current living arrangement?: Yes Admission Status: Voluntary Is patient capable of signing voluntary admission?: Yes Referral Source: Self/Family/Friend Insurance type:  (NONE)     Crisis Care Plan Living Arrangements: Other (Comment) (HOMELESS) Name of Psychiatrist:  (NONE) Name of Therapist:  (NONE)  Education Status Is patient currently in school?: No Highest grade of school patient has completed:  (9)  Risk to self with the past 6 months Suicidal Ideation: Yes-Currently Present Has patient been a risk to self within the past 6 months prior to admission? : Yes Suicidal Intent: Yes-Currently Present Has patient had any suicidal intent within the past 6 months prior to admission? : Yes Is patient at risk for suicide?: Yes Suicidal Plan?: Yes-Currently Present Has patient had any suicidal plan within the past 6 months prior to admission? : Yes Specify Current Suicidal Plan:  (OD) Access to Means: Yes Specify Access to Suicidal Means:  (MEDS) What has been  your use of drugs/alcohol within the last 12 months?:  (DAILY USE) Previous Attempts/Gestures: No How many times?:  (0) Other Self Harm Risks:  (NONE REPORTED) Triggers for Past Attempts: None known Intentional Self Injurious Behavior: None Family Suicide History: Unknown Recent stressful life event(s): Financial Problems, Legal Issues Persecutory voices/beliefs?: No Depression: Yes Depression Symptoms: Despondent, Insomnia, Tearfulness, Isolating, Fatigue, Guilt, Loss of interest in usual pleasures, Feeling worthless/self pity, Feeling angry/irritable Substance abuse history and/or treatment for substance abuse?: Yes Suicide prevention information given to non-admitted patients: Not applicable (UPON  DISCHARGE)  Risk to Others within the past 6 months Homicidal Ideation: No (DENIES) Does patient have any lifetime risk of violence toward others beyond the six months prior to admission? : No Thoughts of Harm to Others: No (DENIES) Current Homicidal Intent: No Current Homicidal Plan: No Access to Homicidal Means: No (STS NO ACCESS TO GUNS) Identified Victim:  (NONE) History of harm to others?: No (DENIES) Assessment of Violence: None Noted Violent Behavior Description:  (NA) Does patient have access to weapons?: No Criminal Charges Pending?: Yes Describe Pending Criminal Charges:  (LARCENY- SHOPLIFTING) Does patient have a court date: Yes Court Date:  (CANNOT REMEMBER) Is patient on probation?: No  Psychosis Hallucinations: None noted (DENIES) Delusions: None noted  Mental Status Report Appearance/Hygiene: Disheveled Eye Contact: Fair Motor Activity: Freedom of movement Speech: Logical/coherent Level of Consciousness: Alert Mood: Depressed, Anxious Affect: Anxious, Blunted, Depressed Anxiety Level: Minimal Thought Processes: Coherent, Relevant Judgement: Impaired Orientation: Person, Place, Time, Situation Obsessive Compulsive Thoughts/Behaviors: None  Cognitive Functioning Concentration: Normal Memory: Recent Intact, Remote Intact IQ: Average Insight: see judgement above Impulse Control: Poor Appetite: Fair Weight Loss:  (0) Weight Gain:  (0) Sleep: Decreased Total Hours of Sleep:  (3) Vegetative Symptoms: None  ADLScreening [facility] Assessment Services) Patient's cognitive ability adequate to safely complete daily activities?: Yes Patient able to express need for assistance with ADLs?: Yes Independently performs ADLs?: Yes (appropriate for developmental age)  Prior Inpatient Therapy Prior Inpatient Therapy: Yes Prior Therapy Dates:  (2016, 2 X 2017) Prior Therapy Facilty/Provider(s):  ([facility]; ALSO SA TX AT [facility] (9 MOS), [facility] & [facility]) Reason for  Treatment:  (MDD, GAD, SA)  Prior Outpatient Therapy Prior Outpatient Therapy: Yes Prior Therapy Dates:  (VARIOUS) Prior Therapy Facilty/Provider(s):  (VARIOUS) Reason for Treatment:  (MDD, GAD, SA) Does patient have an ACCT team?: No Does patient have Intensive In-House Services?  : No Does patient have [facility] services? : No Does patient have P4CC services?: No  ADL Screening (condition at time of admission) Patient's cognitive ability adequate to safely complete daily activities?: Yes Patient able to express need for assistance with ADLs?: Yes Independently performs ADLs?: Yes (appropriate for developmental age)       Abuse/Neglect Assessment (Assessment to be complete while patient is alone) Physical Abuse: Denies Verbal Abuse: Denies Sexual Abuse: Denies Exploitation of patient/patient's resources: Denies Self-Neglect: Denies     Merchant navy officer (For Healthcare) Does Patient Have a Medical Advance Directive?: No Would patient like information on creating a medical advance directive?: No - Patient declined    Additional Information 1:1 In Past 12 Months?: No CIRT Risk: No Elopement Risk: No Does patient have medical clearance?: Yes     Disposition:  Disposition Initial Assessment Completed for this Encounter: Yes Disposition of Patient: Other dispositions Other disposition(s): Other (Comment) (PENDING REVIEW [facility] EXTENDER)  Recommend Inpatient tx per Donell Sievert, PA.  Accepted to [facility] per Clint Bolder, [facility]. [id number] Attending Dr.  Cobos   Spoke with Earley Favor, NP at [facility] and advised of recommendation. She agreed.   Beryle Flock, MS, CRC, [facility] Triage Specialist [facility] T 01/31/2017 11:19 PM

## 2017-01-31 NOTE — ED Notes (Signed)
Security came and wand pt 

## 2017-02-01 ENCOUNTER — Inpatient Hospital Stay (HOSPITAL_COMMUNITY)
Admission: EM | Admit: 2017-02-01 | Discharge: 2017-02-07 | DRG: 897 | Disposition: A | Discharge status: Home / Self Care | Payer: Federal, State, Local not specified - Other | Source: Intra-hospital | Attending: Psychiatry | Admitting: Psychiatry

## 2017-02-01 ENCOUNTER — Encounter (HOSPITAL_COMMUNITY): Payer: Self-pay

## 2017-02-01 DIAGNOSIS — Z590 Homelessness: Secondary | ICD-10-CM

## 2017-02-01 DIAGNOSIS — F1490 Cocaine use, unspecified, uncomplicated: Secondary | ICD-10-CM

## 2017-02-01 DIAGNOSIS — F1190 Opioid use, unspecified, uncomplicated: Secondary | ICD-10-CM | POA: Diagnosis not present

## 2017-02-01 DIAGNOSIS — F1994 Other psychoactive substance use, unspecified with psychoactive substance-induced mood disorder: Principal | ICD-10-CM | POA: Diagnosis present

## 2017-02-01 DIAGNOSIS — Z638 Other specified problems related to primary support group: Secondary | ICD-10-CM

## 2017-02-01 DIAGNOSIS — F1010 Alcohol abuse, uncomplicated: Secondary | ICD-10-CM | POA: Diagnosis present

## 2017-02-01 DIAGNOSIS — I10 Essential (primary) hypertension: Secondary | ICD-10-CM | POA: Diagnosis present

## 2017-02-01 DIAGNOSIS — Z79899 Other long term (current) drug therapy: Secondary | ICD-10-CM

## 2017-02-01 DIAGNOSIS — F17210 Nicotine dependence, cigarettes, uncomplicated: Secondary | ICD-10-CM | POA: Diagnosis present

## 2017-02-01 DIAGNOSIS — F1390 Sedative, hypnotic, or anxiolytic use, unspecified, uncomplicated: Secondary | ICD-10-CM | POA: Diagnosis not present

## 2017-02-01 DIAGNOSIS — Z560 Unemployment, unspecified: Secondary | ICD-10-CM

## 2017-02-01 DIAGNOSIS — K219 Gastro-esophageal reflux disease without esophagitis: Secondary | ICD-10-CM | POA: Diagnosis present

## 2017-02-01 DIAGNOSIS — T446X5A Adverse effect of alpha-adrenoreceptor antagonists, initial encounter: Secondary | ICD-10-CM | POA: Diagnosis present

## 2017-02-01 DIAGNOSIS — F1099 Alcohol use, unspecified with unspecified alcohol-induced disorder: Secondary | ICD-10-CM

## 2017-02-01 LAB — PREGNANCY, URINE: Preg Test, Ur: NEGATIVE

## 2017-02-01 MED ORDER — PAROXETINE HCL 20 MG PO TABS
40.0000 mg | ORAL_TABLET | Freq: Every day | ORAL | Status: DC
  Administered 2017-02-01 – 2017-02-04 (×4): 40 mg via ORAL
  Filled 2017-02-01 (×6): qty 2

## 2017-02-01 MED ORDER — TRAZODONE HCL 50 MG PO TABS
50.0000 mg | ORAL_TABLET | Freq: Every evening | ORAL | Status: DC | PRN
Start: 2017-02-01 — End: 2017-02-07
  Administered 2017-02-01 – 2017-02-06 (×12): 50 mg via ORAL
  Filled 2017-02-01 (×18): qty 1

## 2017-02-01 MED ORDER — LORAZEPAM 1 MG PO TABS: 1.0000 mg | ORAL_TABLET | Freq: Four times a day (QID) | ORAL | Status: DC | PRN

## 2017-02-01 MED ORDER — CLONIDINE HCL 0.1 MG PO TABS
0.1000 mg | ORAL_TABLET | Freq: Every day | ORAL | Status: AC
Start: 2017-02-05 — End: 2017-02-06
  Administered 2017-02-05 – 2017-02-06 (×2): 0.1 mg via ORAL
  Filled 2017-02-01 (×2): qty 1

## 2017-02-01 MED ORDER — ADULT MULTIVITAMIN W/MINERALS CH
1.0000 | ORAL_TABLET | Freq: Every day | ORAL | Status: DC
  Administered 2017-02-01 – 2017-02-07 (×7): 1 via ORAL
  Filled 2017-02-01 (×9): qty 1

## 2017-02-01 MED ORDER — THIAMINE HCL 100 MG/ML IJ SOLN
100.0000 mg | Freq: Once | INTRAMUSCULAR | Status: AC
  Administered 2017-02-01: 100 mg via INTRAMUSCULAR
  Filled 2017-02-01: qty 2

## 2017-02-01 MED ORDER — CHLORDIAZEPOXIDE HCL 25 MG PO CAPS
25.0000 mg | ORAL_CAPSULE | Freq: Every day | ORAL | Status: AC
  Administered 2017-02-03: 25 mg via ORAL
  Filled 2017-02-01: qty 1

## 2017-02-01 MED ORDER — CHLORDIAZEPOXIDE HCL 25 MG PO CAPS
25.0000 mg | ORAL_CAPSULE | Freq: Two times a day (BID) | ORAL | Status: AC
Start: 2017-02-01 — End: 2017-02-02
  Administered 2017-02-01 – 2017-02-02 (×4): 25 mg via ORAL
  Filled 2017-02-01 (×4): qty 1

## 2017-02-01 MED ORDER — VITAMIN B-1 100 MG PO TABS
100.0000 mg | ORAL_TABLET | Freq: Every day | ORAL | Status: DC
  Administered 2017-02-02 – 2017-02-07 (×6): 100 mg via ORAL
  Filled 2017-02-01 (×8): qty 1

## 2017-02-01 MED ORDER — ALUM & MAG HYDROXIDE-SIMETH 200-200-20 MG/5ML PO SUSP
30.0000 mL | ORAL | Status: DC | PRN
  Administered 2017-02-04 – 2017-02-07 (×9): 30 mL via ORAL
  Filled 2017-02-01 (×9): qty 30

## 2017-02-01 MED ORDER — LORAZEPAM 1 MG PO TABS: 1.0000 mg | ORAL_TABLET | Freq: Three times a day (TID) | ORAL | Status: DC

## 2017-02-01 MED ORDER — MAGNESIUM HYDROXIDE 400 MG/5ML PO SUSP
30.0000 mL | Freq: Every day | ORAL | Status: DC | PRN
  Administered 2017-02-04: 30 mL via ORAL
  Filled 2017-02-01: qty 30

## 2017-02-01 MED ORDER — CLONIDINE HCL 0.1 MG PO TABS
0.1000 mg | ORAL_TABLET | Freq: Four times a day (QID) | ORAL | Status: AC
  Administered 2017-02-01 – 2017-02-02 (×6): 0.1 mg via ORAL
  Filled 2017-02-01 (×8): qty 1

## 2017-02-01 MED ORDER — ENSURE ENLIVE PO LIQD
237.0000 mL | Freq: Two times a day (BID) | ORAL | Status: DC
  Administered 2017-02-02: 237 mL via ORAL

## 2017-02-01 MED ORDER — LOPERAMIDE HCL 2 MG PO CAPS: 2.0000 mg | ORAL_CAPSULE | ORAL | Status: AC | PRN

## 2017-02-01 MED ORDER — GABAPENTIN 400 MG PO CAPS
400.0000 mg | ORAL_CAPSULE | Freq: Three times a day (TID) | ORAL | Status: DC
  Administered 2017-02-01 – 2017-02-07 (×19): 400 mg via ORAL
  Filled 2017-02-01 (×3): qty 1
  Filled 2017-02-01: qty 21
  Filled 2017-02-01 (×13): qty 1
  Filled 2017-02-01: qty 21
  Filled 2017-02-01 (×4): qty 1
  Filled 2017-02-01: qty 21
  Filled 2017-02-01: qty 1

## 2017-02-01 MED ORDER — LORAZEPAM 1 MG PO TABS: 1.0000 mg | ORAL_TABLET | Freq: Every day | ORAL | Status: DC

## 2017-02-01 MED ORDER — LORAZEPAM 1 MG PO TABS: 1.0000 mg | ORAL_TABLET | Freq: Two times a day (BID) | ORAL | Status: DC

## 2017-02-01 MED ORDER — METHOCARBAMOL 500 MG PO TABS
500.0000 mg | ORAL_TABLET | Freq: Three times a day (TID) | ORAL | Status: AC | PRN
  Administered 2017-02-03 – 2017-02-05 (×4): 500 mg via ORAL
  Filled 2017-02-01 (×4): qty 1

## 2017-02-01 MED ORDER — NICOTINE 21 MG/24HR TD PT24
21.0000 mg | MEDICATED_PATCH | Freq: Every day | TRANSDERMAL | Status: DC
  Administered 2017-02-01 – 2017-02-07 (×7): 21 mg via TRANSDERMAL
  Filled 2017-02-01 (×9): qty 1

## 2017-02-01 MED ORDER — CLONIDINE HCL 0.1 MG PO TABS
0.1000 mg | ORAL_TABLET | ORAL | Status: AC
  Administered 2017-02-03 – 2017-02-04 (×4): 0.1 mg via ORAL
  Filled 2017-02-01 (×4): qty 1

## 2017-02-01 MED ORDER — LORAZEPAM 1 MG PO TABS
1.0000 mg | ORAL_TABLET | Freq: Four times a day (QID) | ORAL | Status: DC
  Administered 2017-02-01: 1 mg via ORAL
  Filled 2017-02-01: qty 1

## 2017-02-01 MED ORDER — CHLORDIAZEPOXIDE HCL 25 MG PO CAPS
25.0000 mg | ORAL_CAPSULE | Freq: Four times a day (QID) | ORAL | Status: DC | PRN
  Administered 2017-02-02: 25 mg via ORAL
  Filled 2017-02-01: qty 1

## 2017-02-01 MED ORDER — ONDANSETRON 4 MG PO TBDP
4.0000 mg | ORAL_TABLET | Freq: Four times a day (QID) | ORAL | Status: AC | PRN
  Administered 2017-02-01 – 2017-02-03 (×5): 4 mg via ORAL
  Filled 2017-02-01 (×5): qty 1

## 2017-02-01 MED ORDER — DICYCLOMINE HCL 20 MG PO TABS
20.0000 mg | ORAL_TABLET | Freq: Four times a day (QID) | ORAL | Status: AC | PRN
  Administered 2017-02-04: 20 mg via ORAL
  Filled 2017-02-01: qty 1

## 2017-02-01 MED ORDER — NAPROXEN 500 MG PO TABS
500.0000 mg | ORAL_TABLET | Freq: Two times a day (BID) | ORAL | Status: AC | PRN
  Administered 2017-02-03 – 2017-02-05 (×3): 500 mg via ORAL
  Filled 2017-02-01 (×3): qty 1

## 2017-02-01 MED ORDER — HYDROXYZINE HCL 25 MG PO TABS
25.0000 mg | ORAL_TABLET | Freq: Four times a day (QID) | ORAL | Status: DC | PRN
  Administered 2017-02-01 – 2017-02-03 (×4): 25 mg via ORAL
  Filled 2017-02-01 (×4): qty 1

## 2017-02-01 NOTE — Tx Team (Signed)
Initial Treatment Plan 02/01/2017 3:39 AM Wynelle BeckmannLeigh N Liszewski ZOX:[id number]    PATIENT STRESSORS: Educational concerns Financial difficulties Health problems Medication change or noncompliance Occupational concerns Substance abuse   PATIENT STRENGTHS: Ability for insight Active sense of humor Average or above average intelligence Capable of independent living MetallurgistCommunication skills Financial means General fund of knowledge Motivation for treatment/growth   PATIENT IDENTIFIED PROBLEMS: "I need help with depression, anxiety, suicidal thoughts."  "med adjustments"  "AA/NA group information"   "substance abuse"               DISCHARGE CRITERIA:  Ability to meet basic life and health needs Adequate post-discharge living arrangements Improved stabilization in mood, thinking, and/or behavior Medical problems require only outpatient monitoring Motivation to continue treatment in a less acute level of care Need for constant or close observation no longer present  PRELIMINARY DISCHARGE PLAN: Attend aftercare/continuing care group Attend PHP/IOP Attend 12-step recovery group Outpatient therapy Placement in alternative living arrangements  PATIENT/FAMILY INVOLVEMENT: This treatment plan has been presented to and reviewed with the patient, Wynelle BeckmannLeigh N Sabet.  The patient and family have been given the opportunity to ask questions and make suggestions.  Jonetta SpeakAshton E Jamyria Ozanich, RN 02/01/2017, 3:39 AM

## 2017-02-01 NOTE — [facility] Suicide Risk Assessment (Signed)
[facility] Admission Suicide Risk Assessment   Nursing information obtained from:    Demographic factors:    Current Mental Status:    Loss Factors:    Historical Factors:    Risk Reduction Factors:     Total Time spent with patient: 30 minutes Principal Problem: <principal problem not specified> Diagnosis:   Patient Active Problem List   Diagnosis Date Noted  . Opioid use disorder, severe, dependence (HCC) [F11.20] 04/11/2016  . Substance induced mood disorder (HCC) [F19.94] 04/10/2016  . UTI (urinary tract infection) [N39.0] 12/07/2014  . Alcohol dependence with alcohol-induced mood disorder (HCC) [F10.24]   . Major depressive disorder, recurrent episode, severe (HCC) [F33.2] 12/04/2014  . Opioid use disorder, moderate, dependence (HCC) [F11.20] 12/04/2014  . Alcohol use disorder, severe, dependence (HCC) [F10.20] 11/12/2014  . Alcohol abuse [F10.10] 09/10/2013  . Cocaine abuse [F14.10] 09/10/2013   Subjective Data:  46 yo Caucasian female, single, unemployed, homeless. Background history of SUD. Self presented seeking help. Has been using cocaine, alcohol, opioids and benzodiazepines regularly. Has been feeling irritable, has had suicidal thoughts off and on. Wants a safe place to come off substances. UDS was positive for cocaine. BAL <5, AST was mildly elevated. Other parameters are essentially normal.  She is not currently hallucinating. No persecution or paranoia. No passivity of will or thought. She is not having any active suicidal thoughts. She is not confused. Patient is coming off multiple substances. She is irritable but cooperative with care. She has agreed to get back on her medications and be managed symptomatically for withdrawals.   Continued Clinical Symptoms:  Alcohol Use Disorder Identification Test Final Score (AUDIT): 36 The "Alcohol Use Disorders Identification Test", Guidelines for Use in Primary Care, Second Edition.  World Science writer [facility]). Score between  0-7:  no or low risk or alcohol related problems. Score between 8-15:  moderate risk of alcohol related problems. Score between 16-19:  high risk of alcohol related problems. Score 20 or above:  warrants further diagnostic evaluation for alcohol dependence and treatment.   CLINICAL FACTORS:   Alcohol/Substance Abuse/Dependencies   Musculoskeletal: Strength & Muscle Tone: within normal limits Gait & Station: normal Patient leans: N/A  Psychiatric Specialty Exam: Physical Exam  ROS  Blood pressure 97/74, pulse 98, temperature 98.1 F (36.7 C), temperature source Oral, resp. rate 16, last menstrual period 11/27/2015, SpO2 100 %.There is no height or weight on file to calculate BMI.  General Appearance: As in H&P  Eye Contact:  As in H&P  Speech:  As in H&P  Volume:  As in H&P  Mood:  As in H&P  Affect:  As in H&P  Thought Process:  As in H&P  Orientation:  As in H&P  Thought Content:  As in H&P  Suicidal Thoughts:  As in H&P  Homicidal Thoughts:  As in H&P  Memory:  As in H&P  Judgement:  As in H&P  Insight:  As in H&P  Psychomotor Activity:  As in H&P  Concentration:  As in H&P  Recall:  As in H&P  Fund of Knowledge:  As in H&P  Language:  As in H&P  Akathisia:  As in H&P  Handed:  As in H&P  AIMS (if indicated):     Assets:  As in H&P  ADL's:  As in H&P  Cognition:  As in H&P  Sleep:  Number of Hours: 2.75 (early morning admission)      COGNITIVE FEATURES THAT CONTRIBUTE TO RISK:  None  SUICIDE RISK:   Minimal: No identifiable suicidal ideation.  Patients presenting with no risk factors but with morbid ruminations; may be classified as minimal risk based on the severity of the depressive symptoms  PLAN OF CARE:  As in H&P  I certify that inpatient services furnished can reasonably be expected to improve the patient's condition.   Georgiann CockerVincent A Izediuno, MD 02/01/2017, 10:19 AM

## 2017-02-01 NOTE — [facility] Counselor (Signed)
Adult Comprehensive Assessment  Patient ID: Alyssa BeckmannLeigh N Fetsch, female   DOB: 30-Apr-1971, 46 y.o.   MRN: [id number]  Information Source: Information source: Patient  Current Stressors:  Educational / Learning stressors: n/a Employment / Job issues: unemployed Family Relationships: limited family Nurse, learning disabilitycontact Financial / Lack of resources (include bankruptcy): no income Housing / Lack of housing: homeless-spent nights under bridge prior to admission.  Physical health (include injuries & life threatening diseases): n/a Social relationships: little social support-aunt who has her kids is supportive emotionally.  Substance abuse: daily use of mulitple substances including xanax, cocaine, and alcohol. "I'm not using as much as I used to."  Bereavement / Loss: loss parents at age 46 (father was murdered) and mother died from cancer related to her alcoholism; grandmother passed away approximately 4-5 months ago  Living/Environment/Situation:  Living Arrangements: Non-relatives/Friends; homeless; shelters and recently under a bridge.  Living conditions (as described by patient or guardian): chaotic environment, drug use very prevalent How long has patient lived in current situation?: few months  What is atmosphere in current home: Dangerous, Chaotic; temporary  Family History:  Marital status: Single Does patient have children?: Yes How many children?: 2 kids aged 46yo daughter and 16yo son-in custody of pt's aunt.  How is patient's relationship with their children?: Pt's aunt adopted her children; Pt remains in contact with them  Childhood History:  By whom was/is the patient raised?: Both parents Additional childhood history information: Pt's father was murdered at age 46 and Pt's mother died 8 months later due to cihrrosis of the liver; parents both abused substances Description of patient's relationship with caregiver when they were a child: Pt reports a good relatioship with her parents  however they both used and the environment could become chaotic at times.  Patient's description of current relationship with people who raised him/her: Parents are deceased Does patient have siblings?: No Did patient suffer any verbal/emotional/physical/sexual abuse as a child?: No Did patient suffer from severe childhood neglect?: No Has patient ever been sexually abused/assaulted/raped as an adolescent or adult?: No Was the patient ever a victim of a crime or a disaster?: No Witnessed domestic violence?: No Has patient been effected by domestic violence as an adult?: No  Education:  Highest grade of school patient has completed: 11th Currently a Consulting civil engineerstudent?: No Learning disability?: No  Employment/Work Situation:  Employment situation: Unemployed Patient's job has been impacted by current illness: Yes Describe how patient's job has been impacted: substance use keeps her from getting a job What is the longest time patient has a held a job?: 5 years  Where was the patient employed at that time?: [employer] Has patient ever been in the [employer] Lilly and Companymilitary?: No Has patient ever served in Buyer, retailcombat?: No  Financial Resources:  Financial resources: No income, no insurance  Alcohol/Substance Abuse:  What has been your use of drugs/alcohol within the last 12 months?: Pst month:methadone-kicked out of clinic x5 days ago, benzodiazapines, crack cocaine, alcohol- using daily. Long history of substance abuse since the age of 46- drug of choice is heroie-IV use x5 days since leaving methadone clinic. Pt reports she is using weekly: xanax, cocaine/crack, and alcohol. "I'm not using as much as I was in the past."  Alcohol/Substance Abuse Treatment Hx: Past Tx, Inpatient, Past detox, Attends AA/NA, Past Tx, Outpatient (hasnot been attending meetings recently) - [facility], [facility], [facility] in 2016 for detox/MH  treatment. [facility] 03/2016-discharged to [facility] and  completed program. Stayed sober for 3 months before relapsing.  Has alcohol/substance abuse ever caused legal problems?: Yes  Social Support System:  Patient's Community Support System: Poor Describe Community Support System: all friends and family use Type of faith/religion: Ephriam Knuckles How does patient's faith help to cope with current illness?: helps at times  Leisure/Recreation:  Leisure and Hobbies: Writes to family who are out of town; horse back riding  Strengths/Needs:  What things does the patient do well?: good with people, easy to talk to In what areas does patient struggle / problems for patient: addiction, depression, suicidal ideation, plans not working out, having medicine stolen  Discharge Plan:  Does patient have access to transportation?: Yes-bus Plan for no access to transportation at discharge: Plans to use public transportation  Will patient be returning to same living situation after discharge?: No-pt given information to shelters in [place] and [place].  Plan for living situation after discharge: shelter Currently receiving community mental health services: none recently. Wants referral to [facility] to resume subutex treatment and therapy/SAIOP. Not interested in inpatient treatment.  Does patient have financial barriers related to discharge medications?: Yes Patient description of barriers related to discharge medications: No income and no insurance  Summary/Recommendations:   Summary and Recommendations (to be completed by the evaluator): Patient is 46 yo female living in [place], [place] Patient presents to the hospital seeking treatment for suicidal ideations, benzo/cocaine/alcohol abuse, and for medication stabilization. Patient's last admission to [facility] was 03/2016 for similar issues and states that she completed [facility] program after discharge. Patient denies  SI/HI/AVH currently. She has an aunt and 2 children that are supportive-her children live in [place] with her aunt. Patient is currently homeless and plans to return to the shelter. Patient is interested in going to [facility] for outpatient treatment. Patient has a diagnosis of MDD and Polysubstance abuse. Recommendations for patient include: medication management for mood stabilization/detox, encourage group attendance and participation, and development of comprehensive mental wellness/sobriety plan. CSW assessing for appropriate referrals.   Ledell Peoples Smart LCSW 02/01/2017 3:29 PM

## 2017-02-01 NOTE — Progress Notes (Signed)
Nursing Progress Note [id number]  D) Report received from [facility]. Per chart review, patient has been medicated with scheduled medications at ED. Patient is currently resting in bed and appears to be asleep. Patient in no acute distress.  A) Patient safety maintained with q15 min safety checks. Low fall risk precautions in place. Will continue to support patient as needed.  R) Patient remains safe on the unit at this time. Will continue to monitor.

## 2017-02-01 NOTE — Progress Notes (Signed)
Initial Nutrition Assessment  DOCUMENTATION CODES:   Not applicable  INTERVENTION:   Provide meals and snacks   Ensure Enlive po BID, each supplement provides 350 kcal and 20 grams of protein  MVI   NUTRITION DIAGNOSIS:   Inadequate oral intake related to social / environmental circumstances as evidenced by other (see comment) (pt homeless and with drug etoh abuse ).  GOAL:   Patient will meet greater than or equal to 90% of their needs  MONITOR:   PO intake, Supplement acceptance  REASON FOR ASSESSMENT:   Malnutrition Screening Tool    ASSESSMENT:   46 year old female who has a history of drug abuse and alcohol abuse who presents with suicidal ideation.    Pt with wt loss of 20lbs(10%) in 10 months; this is not significant. Per MD note, pt homeless and has been using drugs and etoh since being a teenager. RD suspects that this pt has not been meeting nutrient needs r/t social/environmental circumstances. Pt order for Ensure and MVI.   Medications reviewed and include: MVI, nicotine, thiamine  Labs reviewed  Diet Order:  Diet regular Room service appropriate? Yes; Fluid consistency: Thin  Skin:  Reviewed, no issues  Last BM:  8/13  Height:   Ht Readings from Last 1 Encounters:  01/31/17 5\' 1"  (1.549 m)    Weight:   Wt Readings from Last 1 Encounters:  01/31/17 180 lb (81.6 kg)    Ideal Body Weight:  47.7 kg  BMI:  There is no height or weight on file to calculate BMI.  Estimated Nutritional Needs:   Kcal:  1600-1800kcal/day   Protein:  65-82g/day   Fluid:  >1.4L/day   EDUCATION NEEDS:   No education needs identified at this time  Betsey Holidayasey Xenia Nile MS, RD, LDN Pager #[phone] After Hours Pager: [phone]

## 2017-02-01 NOTE — Progress Notes (Signed)
D: Pt A & O X4.  Visible in milieu at intervals during shift for medications and meals. Denies HI, AVH and pain when assessed. Endorsed passive SI, "I'm depressed, I'm not working don't really have a place to stay"; verbally contracts for safety.  Declined to attend groups when invited. Rates her depression, hopelessness and anxiety all 10/10 on self inventory sheet. Reports poor sleep with fair appetite, low energy and poor concentration level.  A: Scheduled and PRN medications administered per MD's order. Emotional support and availability provided to pt. Writer encouraged pt to voice concerns, attend groups and comply with medication regimen. Routine safety checks continues without self harm gestures to note thus far. R: Pt took her medications without issues. Tolerates all PO intake well. Remains safe on and off unit. POC remains effective for mood stability and safety.

## 2017-02-01 NOTE — [facility] Group Notes (Signed)
Pt. Did not attend group. 

## 2017-02-01 NOTE — [facility] Group Notes (Signed)
[facility] LCSW Group Therapy  02/01/2017 3:14 PM  Type of Therapy:  Group Therapy  Participation Level:  Did Not Attend-pt invited. Chose to remain in bed.   Summary of Progress/Problems:  Finding Balance in Life. Today's group focused on defining balance in one's own words, identifying things that can knock one off balance, and exploring healthy ways to maintain balance in life. Group members were asked to provide an example of a time when they felt off balance, describe how they handled that situation,and process healthier ways to regain balance in the future. Group members were asked to share the most important tool for maintaining balance that they [facility] and how they plan to apply this method after discharge.   Laurna Shetley N Smart LCSW 02/01/2017, 3:14 PM

## 2017-02-01 NOTE — H&P (Signed)
Psychiatric Admission Assessment Adult  Patient Identification: Alyssa Kelley MRN:  [id number] Date of Evaluation:  02/01/2017 Chief Complaint:  mdd resurrent alcohol use disorder Principal Diagnosis: Substance Induced Mood Disorder Diagnosis:   Patient Active Problem List   Diagnosis Date Noted  . Opioid use disorder, severe, dependence ([place]) [F11.20] 04/11/2016  . Substance induced mood disorder ([place]) [F19.94] 04/10/2016  . UTI (urinary tract infection) [N39.0] 12/07/2014  . Alcohol dependence with alcohol-induced mood disorder ([place]) [F10.24]   . Major depressive disorder, recurrent episode, severe ([place]) [F33.2] 12/04/2014  . Opioid use disorder, moderate, dependence ([place]) [F11.20] 12/04/2014  . Alcohol use disorder, severe, dependence ([place]) [F10.20] 11/12/2014  . Alcohol abuse [F10.10] 09/10/2013  . Cocaine abuse [F14.10] 09/10/2013   History of Present Illness:  46 yo Caucasian female, single, unemployed, homeless. Background history of SUD. Self presented seeking help. Has been using cocaine, alcohol, opioids and benzodiazepines regularly. Has been feeling irritable, has had suicidal thoughts off and on. Wants a safe place to come off substances. UDS was positive for cocaine. BAL <5, AST was mildly elevated. Other parameters are essentially normal.  At interview, patient tells me that she has been using substances since her early teens. Says she has been homeless for the past month. She does not have any income. She is supported by her boyfriend. Says she is tired of this and wants to get clean. Her hope is to detox and get into [facility] programme in [place]. Wants to remain on Suboxone which they prescribe there. Patient says she is not really suicidal. Was desperate to get help. Reports mood swings and negative thoughts coming off substances. Sometimes feels her life is not worth living this way. Has had periods of hallucinations while under the influence of substances. No  hallucinations lately. Does not feel persecuted. Not expressing any delusions. No thoughts of violence. No homicidal thoughts. No evidence of PTSD. No access to weapons. Major stressor is being homeless. No legal issues. No relational issues. She has limited support as her substance use has affected relationship with her children and family members.   Total Time spent with patient: 1 hour  Past Psychiatric History: This is her third admission here. She has been admitted under similar circumstances in the past. She has been in rehab on multiple occassions. Says her longest sobriety was for a year. She was in prison then. No involvement with AA or NA. Denies any past suicidal behavior. No past history of violent behavior.  She is followed in the community by an outpatient addiction center. She is on Paxil 40 mg daily, Gabapentin 300 mg TID, Prazosin, Clonidine, Inderal and Hydroxyzine.   Is the patient at risk to self? No.  Has the patient been a risk to self in the past 6 months? No.  Has the patient been a risk to self within the distant past? No.  Is the patient a risk to others? No.  Has the patient been a risk to others in the past 6 months? No.  Has the patient been a risk to others within the distant past? No.   Prior Inpatient Therapy:   Prior Outpatient Therapy:    Alcohol Screening: 1. How often do you have a drink containing alcohol?: 4 or more times a week 2. How many drinks containing alcohol do you have on a typical day when you are drinking?: 10 or more 3. How often do you have six or more drinks on one occasion?: Daily or almost daily Preliminary Score:  8 4. How often during the last year have you found that you were not able to stop drinking once you had started?: Daily or almost daily 5. How often during the last year have you failed to do what was normally expected from you becasue of drinking?: Daily or almost daily 6. How often during the last year have you needed a first  drink in the morning to get yourself going after a heavy drinking session?: Daily or almost daily 7. How often during the last year have you had a feeling of guilt of remorse after drinking?: Daily or almost daily 8. How often during the last year have you been unable to remember what happened the night before because you had been drinking?: Daily or almost daily 9. Have you or someone else been injured as a result of your drinking?: No 10. Has a relative or friend or a doctor or another health worker been concerned about your drinking or suggested you cut down?: Yes, during the last year Alcohol Use Disorder Identification Test Final Score (AUDIT): 36 Brief Intervention: Yes Substance Abuse History in the last 12 months:  Yes.   Consequences of Substance Abuse: Legal issues in the past. Has been in prison twice. Affected support from her family members.  Previous Psychotropic Medications: Yes  Psychological Evaluations: Yes  Past Medical History:  Past Medical History:  Diagnosis Date  . Abscess of mouth   . Alcohol abuse   . Anxiety   . Benzodiazepine abuse   . Cocaine abuse   . Depression   . Hepatitis C   . Heroin abuse   . Hypertension   . 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 CESAREAN SECTION     Family History: No family history on file. Family Psychiatric  History: Family history of addiction. Tobacco Screening: Have you used any form of tobacco in the last 30 days? (Cigarettes, Smokeless Tobacco, Cigars, and/or Pipes): Yes Tobacco use, Select all that apply: 5 or more cigarettes per day Are you interested in Tobacco Cessation Medications?: Yes, will notify MD for an order Counseled patient on smoking cessation including recognizing danger situations, developing coping skills and basic information about quitting provided: Yes Social History:  History  Alcohol Use  . 50.4 oz/week  . 84 Cans of beer per week    Comment: 1 12-pack  daiy     History  Drug Use  . Types: Cocaine, Oxycodone, Heroin, Benzodiazepines    Comment: Heroin, benzos    Additional Social History:      Allergies:  No Known Allergies Lab Results:  Results for orders placed or performed during the hospital encounter of 01/31/17 (from the past 48 hour(s))  Comprehensive metabolic panel     Status: Abnormal   Collection Time: 01/31/17  4:41 PM  Result Value Ref Range   Sodium 137 135 - 145 mmol/L   Potassium 4.1 3.5 - 5.1 mmol/L   Chloride 104 101 - 111 mmol/L   CO2 18 (L) 22 - 32 mmol/L   Glucose, Bld 111 (H) 65 - 99 mg/dL   BUN 7 6 - 20 mg/dL   Creatinine, Ser 0.70 0.44 - 1.00 mg/dL   Calcium 9.4 8.9 - 10.3 mg/dL   Total Protein 8.3 (H) 6.5 - 8.1 g/dL   Albumin 3.7 3.5 - 5.0 g/dL   AST 78 (H) 15 - 41 U/L   ALT 49 14 - 54 U/L   Alkaline Phosphatase  73 38 - 126 U/L   Total Bilirubin 1.4 (H) 0.3 - 1.2 mg/dL   GFR calc non Af Amer >60 >60 mL/min   GFR calc Af Amer >60 >60 mL/min    Comment: (NOTE) The eGFR has been calculated using the CKD EPI equation. This calculation has not been validated in all clinical situations. eGFR's persistently <60 mL/min signify possible Chronic Kidney Disease.    Anion gap 15 5 - 15  Ethanol     Status: None   Collection Time: 01/31/17  4:41 PM  Result Value Ref Range   Alcohol, Ethyl (B) <5 <5 mg/dL    Comment:        LOWEST DETECTABLE LIMIT FOR SERUM ALCOHOL IS 5 mg/dL FOR MEDICAL PURPOSES ONLY   Salicylate level     Status: None   Collection Time: 01/31/17  4:41 PM  Result Value Ref Range   Salicylate Lvl <4.7 2.8 - 30.0 mg/dL  Acetaminophen level     Status: Abnormal   Collection Time: 01/31/17  4:41 PM  Result Value Ref Range   Acetaminophen (Tylenol), Serum <10 (L) 10 - 30 ug/mL    Comment:        THERAPEUTIC CONCENTRATIONS VARY SIGNIFICANTLY. A RANGE OF 10-30 ug/mL MAY BE AN EFFECTIVE CONCENTRATION FOR MANY PATIENTS. HOWEVER, SOME ARE BEST TREATED AT CONCENTRATIONS OUTSIDE  THIS RANGE. ACETAMINOPHEN CONCENTRATIONS >150 ug/mL AT 4 HOURS AFTER INGESTION AND >50 ug/mL AT 12 HOURS AFTER INGESTION ARE OFTEN ASSOCIATED WITH TOXIC REACTIONS.   cbc     Status: Abnormal   Collection Time: 01/31/17  4:41 PM  Result Value Ref Range   WBC 6.8 4.0 - 10.5 K/uL   RBC 5.17 (H) 3.87 - 5.11 MIL/uL   Hemoglobin 15.1 (H) 12.0 - 15.0 g/dL   HCT 44.3 36.0 - 46.0 %   MCV 85.7 78.0 - 100.0 fL   MCH 29.2 26.0 - 34.0 pg   MCHC 34.1 30.0 - 36.0 g/dL   RDW 13.8 11.5 - 15.5 %   Platelets 115 (L) 150 - 400 K/uL    Comment: REPEATED TO VERIFY PLATELET COUNT CONFIRMED BY SMEAR   Rapid urine drug screen (hospital performed)     Status: Abnormal   Collection Time: 01/31/17  7:14 PM  Result Value Ref Range   Opiates NONE DETECTED NONE DETECTED   Cocaine POSITIVE (A) NONE DETECTED   Benzodiazepines NONE DETECTED NONE DETECTED   Amphetamines NONE DETECTED NONE DETECTED   Tetrahydrocannabinol NONE DETECTED NONE DETECTED   Barbiturates NONE DETECTED NONE DETECTED    Comment:        DRUG SCREEN FOR MEDICAL PURPOSES ONLY.  IF CONFIRMATION IS NEEDED FOR ANY PURPOSE, NOTIFY LAB WITHIN 5 DAYS.        LOWEST DETECTABLE LIMITS FOR URINE DRUG SCREEN Drug Class       Cutoff (ng/mL) Amphetamine      1000 Barbiturate      200 Benzodiazepine   829 Tricyclics       562 Opiates          300 Cocaine          300 THC              50     Blood Alcohol level:  Lab Results  Component Value Date   ETH <5 01/31/2017   ETH <5 13/01/6577    Metabolic Disorder Labs:  No results found for: HGBA1C, MPG No results found for: PROLACTIN No results found for: CHOL, TRIG,  HDL, CHOLHDL, VLDL, LDLCALC  Current Medications: Current Facility-Administered Medications  Medication Dose Route Frequency Provider Last Rate Last Dose  . alum & mag hydroxide-simeth (MAALOX/MYLANTA) 200-200-20 MG/5ML suspension 30 mL  30 mL Oral Q4H PRN Patriciaann Clan E, PA-C      . cloNIDine (CATAPRES) tablet 0.1 mg   0.1 mg Oral QID Patriciaann Clan E, PA-C       Followed by  . [START ON 02/03/2017] cloNIDine (CATAPRES) tablet 0.1 mg  0.1 mg Oral BH-qamhs Simon, Spencer E, PA-C       Followed by  . [START ON 02/05/2017] cloNIDine (CATAPRES) tablet 0.1 mg  0.1 mg Oral QAC breakfast Laverle Hobby, PA-C      . dicyclomine (BENTYL) tablet 20 mg  20 mg Oral Q6H PRN Patriciaann Clan E, PA-C      . feeding supplement (ENSURE ENLIVE) (ENSURE ENLIVE) liquid 237 mL  237 mL Oral BID BM Simon, Spencer E, PA-C      . hydrOXYzine (ATARAX/VISTARIL) tablet 25 mg  25 mg Oral Q6H PRN Patriciaann Clan E, PA-C      . loperamide (IMODIUM) capsule 2-4 mg  2-4 mg Oral PRN Laverle Hobby, PA-C      . LORazepam (ATIVAN) tablet 1 mg  1 mg Oral Q6H PRN Laverle Hobby, PA-C      . LORazepam (ATIVAN) tablet 1 mg  1 mg Oral QID Patriciaann Clan E, PA-C   1 mg at 02/01/17 2595   Followed by  . [START ON 02/02/2017] LORazepam (ATIVAN) tablet 1 mg  1 mg Oral TID Laverle Hobby, PA-C       Followed by  . [START ON 02/03/2017] LORazepam (ATIVAN) tablet 1 mg  1 mg Oral BID Patriciaann Clan E, PA-C       Followed by  . [START ON 02/04/2017] LORazepam (ATIVAN) tablet 1 mg  1 mg Oral Daily Simon, Spencer E, PA-C      . magnesium hydroxide (MILK OF MAGNESIA) suspension 30 mL  30 mL Oral Daily PRN Laverle Hobby, PA-C      . methocarbamol (ROBAXIN) tablet 500 mg  500 mg Oral Q8H PRN Laverle Hobby, PA-C      . multivitamin with minerals tablet 1 tablet  1 tablet Oral Daily Laverle Hobby, PA-C   1 tablet at 02/01/17 6387  . naproxen (NAPROSYN) tablet 500 mg  500 mg Oral BID PRN Laverle Hobby, PA-C      . nicotine (NICODERM CQ - dosed in mg/24 hours) patch 21 mg  21 mg Transdermal Daily Patriciaann Clan E, PA-C   21 mg at 02/01/17 0837  . ondansetron (ZOFRAN-ODT) disintegrating tablet 4 mg  4 mg Oral Q6H PRN Laverle Hobby, PA-C      . [START ON 02/02/2017] thiamine (VITAMIN B-1) tablet 100 mg  100 mg Oral Daily Simon, Spencer E, PA-C      .  traZODone (DESYREL) tablet 50 mg  50 mg Oral QHS,MR X 1 Simon, Spencer E, PA-C       PTA Medications: Prescriptions Prior to Admission  Medication Sig Dispense Refill Last Dose  . cloNIDine (CATAPRES) 0.1 MG tablet Take 1 tablet (0.1 mg total) by mouth 2 (two) times daily. 60 tablet 0 Past Week at Unknown time  . gabapentin (NEURONTIN) 300 MG capsule Take 2 capsules (600 mg total) by mouth 3 (three) times daily. 180 capsule 0 Past Week at Unknown time  . hydrOXYzine (ATARAX/VISTARIL) 50 MG tablet Take 1  tablet (50 mg total) by mouth every 6 (six) hours as needed for anxiety (or insomnia). 120 tablet 0 unk  . nicotine (NICODERM CQ - DOSED IN MG/24 HOURS) 21 mg/24hr patch Place 1 patch (21 mg total) onto the skin daily as needed (Nicotine Craving). 28 patch 0 unk  . PARoxetine (PAXIL) 40 MG tablet Take 1 tablet (40 mg total) by mouth at bedtime. 30 tablet 0 Past Week at Unknown time  . prazosin (MINIPRESS) 1 MG capsule Take 1 capsule (1 mg total) by mouth at bedtime. 30 capsule 0 Past Week at Unknown time    Musculoskeletal: Strength & Muscle Tone: within normal limits Gait & Station: normal Patient leans: N/A  Psychiatric Specialty Exam: Physical Exam  Constitutional: No distress.  HENT:  Head: Normocephalic.  Respiratory: Effort normal.  Neurological: She is alert.  Skin: Skin is warm. She is not diaphoretic.  Psychiatric:  As above    ROS  Blood pressure 97/74, pulse 98, temperature 98.1 F (36.7 C), temperature source Oral, resp. rate 16, last menstrual period 11/27/2015, SpO2 100 %.There is no height or weight on file to calculate BMI.  General Appearance:  Was in bed just before interview, underlying irritability but was able to tolerate interview. Not shaky, not sweaty, not confused. Not unsteady, normal conjugate eye movements. Not internally distressed. Appropriate behavior.   Eye Contact:  Good  Speech:  Clear and Coherent and Normal Rate  Volume:  Normal  Mood:  Irritable   Affect:  Congruent and Constricted  Thought Process:  Coherent and Linear  Orientation:  Full (Time, Place, and Person)  Thought Content:  Ruminations about the negatives in her past.   Suicidal Thoughts:  Thoughts off and on. No intent to act. Wants to get help  Homicidal Thoughts:  No  Memory:  Immediate;   Good Recent;   Fair Remote;   Fair  Judgement:  Fair  Insight:  Good  Psychomotor Activity:  Normal  Concentration:  Concentration: Fair and Attention Span: Good  Recall:  [place] of Knowledge:  Good  Language:  Good  Akathisia:  Negative  Handed:    AIMS (if indicated):     Assets:  Communication Skills Desire for Improvement Resilience  ADL's:  Intact  Cognition:  WNL  Sleep:  Number of Hours: 2.75 (early morning admission)    Assessment and Plan  Patient has a family and personal history of addiction. She is chronically using multiple substances. Lifestyle has been chaotic. Mood symptoms are in context of substance use. She is currently motivated to get help. She is not having active suicidal thoughts. She is irritable and potentially impulsive as she is coming off multiple substances. We discussed symptomatic treatment. She has has agreed to get back on her home medications.    Psychiatric: SUD SIMD  Medical: HTN BET  Psychosocial:  Homelessness Limited support Unemployment  PLAN: 1. Alcohol withdrawal protocol. Would use Librium.  2. Opiate withdrawaal protocol. 3. Recommence home psychotropic medications ( Paxil, Gabapentin, Prazosin) 4. Encourage unit groups and activities 5. Monitor mood, behavior and interaction with peers 6. Motivational enhancement  7. SW would facilitate aftercare    Observation Level/Precautions:  Detox 15 minute checks  Laboratory:    Psychotherapy:    Medications:    Consultations:    Discharge Concerns:    Estimated LOS:  Other:     Physician Treatment Plan for Primary Diagnosis: <principal problem not  specified> Long Term Goal(s): Improvement in symptoms so as ready  for discharge  Short Term Goals: Ability to identify changes in lifestyle to reduce recurrence of condition will improve, Ability to verbalize feelings will improve, Ability to disclose and discuss suicidal ideas, Ability to demonstrate self-control will improve, Ability to identify and develop effective coping behaviors will improve, Ability to maintain clinical measurements within normal limits will improve, Compliance with prescribed medications will improve and Ability to identify triggers associated with substance abuse/mental health issues will improve  Physician Treatment Plan for Secondary Diagnosis: Active Problems:   Substance induced mood disorder ([place])  Long Term Goal(s): Improvement in symptoms so as ready for discharge  Short Term Goals: Ability to identify changes in lifestyle to reduce recurrence of condition will improve, Ability to verbalize feelings will improve, Ability to disclose and discuss suicidal ideas, Ability to demonstrate self-control will improve, Ability to identify and develop effective coping behaviors will improve, Ability to maintain clinical measurements within normal limits will improve, Compliance with prescribed medications will improve and Ability to identify triggers associated with substance abuse/mental health issues will improve  I certify that inpatient services furnished can reasonably be expected to improve the patient's condition.    [place], MD 8/16/20189:48 AM

## 2017-02-02 MED ORDER — PROPRANOLOL HCL 20 MG PO TABS
20.0000 mg | ORAL_TABLET | Freq: Three times a day (TID) | ORAL | Status: DC
  Administered 2017-02-02: 20 mg via ORAL
  Administered 2017-02-02: 10 mg via ORAL
  Administered 2017-02-03 – 2017-02-07 (×10): 20 mg via ORAL
  Filled 2017-02-02: qty 1
  Filled 2017-02-02: qty 2
  Filled 2017-02-02 (×2): qty 1
  Filled 2017-02-02 (×2): qty 21
  Filled 2017-02-02 (×2): qty 1
  Filled 2017-02-02: qty 21
  Filled 2017-02-02: qty 1
  Filled 2017-02-02: qty 2
  Filled 2017-02-02 (×8): qty 1
  Filled 2017-02-02: qty 2
  Filled 2017-02-02 (×3): qty 1

## 2017-02-02 MED ORDER — HYDROCORTISONE 1 % EX CREA
TOPICAL_CREAM | CUTANEOUS | Status: AC
  Administered 2017-02-03: 09:00:00
  Filled 2017-02-02: qty 28

## 2017-02-02 NOTE — Progress Notes (Signed)
Adult Psychoeducational Group Note  Date:  02/02/2017 Time:  10:45 AM  Group Topic/Focus:  Healthy Communication:   The focus of this group is to discuss communication, barriers to communication, as well as healthy ways to communicate with others.  Participation Level:  Active  Participation Quality:  Attentive  Affect:  Appropriate  Cognitive:  Appropriate  Insight: Appropriate, Good and Improving  Engagement in Group:  Engaged  Modes of Intervention:  Activity  Additional Comments:  Pt did participate in all group activities today.  Griffyn Kucinski R Charvi Gammage 02/02/2017, 10:45 AM

## 2017-02-02 NOTE — Progress Notes (Signed)
D   Pt is pleasant on approach   She does endorse depression and anxiety    She asks for as much medication as she can have    She reports having some withdrawal symptoms including cravings anxiety and sleeplessness    She interacts well with other patients and is compliant with treatment A   Verbal support given   Medications administered and effectiveness monitored    Q 15 min checks   Discussed withdrawal symptoms  R   Pt is safe and responsive to verbal support

## 2017-02-02 NOTE — Tx Team (Signed)
Interdisciplinary Treatment and Diagnostic Plan Update  02/02/2017 Time of Session: [id number] Alyssa Kelley MRN: [id number]  Principal Diagnosis: Substance induced mood disorder (HCC)  Secondary Diagnoses: Principal Problem:   Substance induced mood disorder (HCC)   Current Medications:  Current Facility-Administered Medications  Medication Dose Route Frequency Provider Last Rate Last Dose  . alum & mag hydroxide-simeth (MAALOX/MYLANTA) 200-200-20 MG/5ML suspension 30 mL  30 mL Oral Q4H PRN Donell Sievert E, PA-C      . chlordiazePOXIDE (LIBRIUM) capsule 25 mg  25 mg Oral BID Izediuno, Delight Ovens, MD   25 mg at 02/02/17 0805  . [START ON 02/03/2017] chlordiazePOXIDE (LIBRIUM) capsule 25 mg  25 mg Oral Daily Izediuno, Vincent A, MD      . chlordiazePOXIDE (LIBRIUM) capsule 25 mg  25 mg Oral QID PRN Izediuno, Vincent A, MD      . cloNIDine (CATAPRES) tablet 0.1 mg  0.1 mg Oral QID Donell Sievert E, PA-C   0.1 mg at 02/02/17 0805   Followed by  . [START ON 02/03/2017] cloNIDine (CATAPRES) tablet 0.1 mg  0.1 mg Oral BH-qamhs Simon, Spencer E, PA-C       Followed by  . [START ON 02/05/2017] cloNIDine (CATAPRES) tablet 0.1 mg  0.1 mg Oral QAC breakfast Kerry Hough, PA-C      . dicyclomine (BENTYL) tablet 20 mg  20 mg Oral Q6H PRN Donell Sievert E, PA-C      . feeding supplement (ENSURE ENLIVE) (ENSURE ENLIVE) liquid 237 mL  237 mL Oral BID BM Simon, Spencer E, PA-C      . gabapentin (NEURONTIN) capsule 400 mg  400 mg Oral TID Georgiann Cocker, MD   400 mg at 02/02/17 0805  . hydrOXYzine (ATARAX/VISTARIL) tablet 25 mg  25 mg Oral Q6H PRN Kerry Hough, PA-C   25 mg at 02/01/17 2121  . loperamide (IMODIUM) capsule 2-4 mg  2-4 mg Oral PRN Donell Sievert E, PA-C      . magnesium hydroxide (MILK OF MAGNESIA) suspension 30 mL  30 mL Oral Daily PRN Kerry Hough, PA-C      . methocarbamol (ROBAXIN) tablet 500 mg  500 mg Oral Q8H PRN Kerry Hough, PA-C      . multivitamin with minerals  tablet 1 tablet  1 tablet Oral Daily Kerry Hough, PA-C   1 tablet at 02/02/17 0805  . naproxen (NAPROSYN) tablet 500 mg  500 mg Oral BID PRN Kerry Hough, PA-C      . nicotine (NICODERM CQ - dosed in mg/24 hours) patch 21 mg  21 mg Transdermal Daily Donell Sievert E, PA-C   21 mg at 02/02/17 0805  . ondansetron (ZOFRAN-ODT) disintegrating tablet 4 mg  4 mg Oral Q6H PRN Kerry Hough, PA-C   4 mg at 02/01/17 0836  . PARoxetine (PAXIL) tablet 40 mg  40 mg Oral Daily Izediuno, Delight Ovens, MD   40 mg at 02/02/17 0805  . thiamine (VITAMIN B-1) tablet 100 mg  100 mg Oral Daily Kerry Hough, PA-C   100 mg at 02/02/17 0998  . traZODone (DESYREL) tablet 50 mg  50 mg Oral QHS,MR X 1 Kerry Hough, PA-C   50 mg at 02/01/17 2213   PTA Medications: Prescriptions Prior to Admission  Medication Sig Dispense Refill Last Dose  . cloNIDine (CATAPRES) 0.1 MG tablet Take 1 tablet (0.1 mg total) by mouth 2 (two) times daily. 60 tablet 0 Past Week at Unknown time  .  gabapentin (NEURONTIN) 300 MG capsule Take 2 capsules (600 mg total) by mouth 3 (three) times daily. 180 capsule 0 Past Week at Unknown time  . hydrOXYzine (ATARAX/VISTARIL) 50 MG tablet Take 1 tablet (50 mg total) by mouth every 6 (six) hours as needed for anxiety (or insomnia). 120 tablet 0 unk  . nicotine (NICODERM CQ - DOSED IN MG/24 HOURS) 21 mg/24hr patch Place 1 patch (21 mg total) onto the skin daily as needed (Nicotine Craving). 28 patch 0 unk  . PARoxetine (PAXIL) 40 MG tablet Take 1 tablet (40 mg total) by mouth at bedtime. 30 tablet 0 Past Week at Unknown time  . prazosin (MINIPRESS) 1 MG capsule Take 1 capsule (1 mg total) by mouth at bedtime. 30 capsule 0 Past Week at Unknown time    Patient Stressors: Educational concerns Financial difficulties Health problems Medication change or noncompliance Occupational concerns Substance abuse  Patient Strengths: Ability for insight Active sense of humor Average or above  average intelligence Capable of independent living Metallurgist fund of knowledge Motivation for treatment/growth  Treatment Modalities: Medication Management, Group therapy, Case management,  1 to 1 session with clinician, Psychoeducation, Recreational therapy.   Physician Treatment Plan for Primary Diagnosis: Substance induced mood disorder (HCC) Long Term Goal(s): Improvement in symptoms so as ready for discharge Improvement in symptoms so as ready for discharge   Short Term Goals: Ability to identify changes in lifestyle to reduce recurrence of condition will improve Ability to verbalize feelings will improve Ability to disclose and discuss suicidal ideas Ability to demonstrate self-control will improve Ability to identify and develop effective coping behaviors will improve Ability to maintain clinical measurements within normal limits will improve Compliance with prescribed medications will improve Ability to identify triggers associated with substance abuse/mental health issues will improve Ability to identify changes in lifestyle to reduce recurrence of condition will improve Ability to verbalize feelings will improve Ability to disclose and discuss suicidal ideas Ability to demonstrate self-control will improve Ability to identify and develop effective coping behaviors will improve Ability to maintain clinical measurements within normal limits will improve Compliance with prescribed medications will improve Ability to identify triggers associated with substance abuse/mental health issues will improve  Medication Management: Evaluate patient's response, side effects, and tolerance of medication regimen.  Therapeutic Interventions: 1 to 1 sessions, Unit Group sessions and Medication administration.  Evaluation of Outcomes: Progressing  Physician Treatment Plan for Secondary Diagnosis: Principal Problem:   Substance induced mood disorder  (HCC)  Long Term Goal(s): Improvement in symptoms so as ready for discharge Improvement in symptoms so as ready for discharge   Short Term Goals: Ability to identify changes in lifestyle to reduce recurrence of condition will improve Ability to verbalize feelings will improve Ability to disclose and discuss suicidal ideas Ability to demonstrate self-control will improve Ability to identify and develop effective coping behaviors will improve Ability to maintain clinical measurements within normal limits will improve Compliance with prescribed medications will improve Ability to identify triggers associated with substance abuse/mental health issues will improve Ability to identify changes in lifestyle to reduce recurrence of condition will improve Ability to verbalize feelings will improve Ability to disclose and discuss suicidal ideas Ability to demonstrate self-control will improve Ability to identify and develop effective coping behaviors will improve Ability to maintain clinical measurements within normal limits will improve Compliance with prescribed medications will improve Ability to identify triggers associated with substance abuse/mental health issues will improve     Medication Management:  Evaluate patient's response, side effects, and tolerance of medication regimen.  Therapeutic Interventions: 1 to 1 sessions, Unit Group sessions and Medication administration.  Evaluation of Outcomes: Progressing   RN Treatment Plan for Primary Diagnosis: Substance induced mood disorder (HCC) Long Term Goal(s): Knowledge of disease and therapeutic regimen to maintain health will improve  Short Term Goals: Ability to remain free from injury will improve, Ability to participate in decision making will improve and Ability to disclose and discuss suicidal ideas  Medication Management: RN will administer medications as ordered by provider, will assess and evaluate patient's response and provide  education to patient for prescribed medication. RN will report any adverse and/or side effects to prescribing provider.  Therapeutic Interventions: 1 on 1 counseling sessions, Psychoeducation, Medication administration, Evaluate responses to treatment, Monitor vital signs and CBGs as ordered, Perform/monitor CIWA, COWS, AIMS and Fall Risk screenings as ordered, Perform wound care treatments as ordered.  Evaluation of Outcomes: Progressing   LCSW Treatment Plan for Primary Diagnosis: Substance induced mood disorder (HCC) Long Term Goal(s): Safe transition to appropriate next level of care at discharge, Engage patient in therapeutic group addressing interpersonal concerns.  Short Term Goals: Engage patient in aftercare planning with referrals and resources, Facilitate patient progression through stages of change regarding substance use diagnoses and concerns and Identify triggers associated with mental health/substance abuse issues  Therapeutic Interventions: Assess for all discharge needs, 1 to 1 time with Social worker, Explore available resources and support systems, Assess for adequacy in community support network, Educate family and significant other(s) on suicide prevention, Complete Psychosocial Assessment, Interpersonal group therapy.  Evaluation of Outcomes: Progressing   Progress in Treatment: Attending groups: Yes. Participating in groups: Yes. Taking medication as prescribed: Yes. Toleration medication: Yes. Family/Significant other contact made: No, will contact:  Alyssa Kelley's aunt for collateral information and to complete SPE. Alyssa Kelley is trying to locate phone number.  Patient understands diagnosis: Yes. Discussing patient identified problems/goals with staff: Yes. Medical problems stabilized or resolved: Yes. Denies suicidal/homicidal ideation: Yes. Issues/concerns per patient self-inventory: No. Other: n/a   New problem(s) identified: No, Describe:  n/a  New Short Term/Long Term  Goal(s): detox, medication management for mood stabilization, development of comprehensive mental wellness/sobriety plan.   Discharge Plan or Barriers: Alyssa Kelley plans to return to shelter; would like follow-up with Dr. Midge Aver at [facility] to restart subutex. MHAG and AA/NA information for [employer] provided for additional community support. Alyssa Kelley is not interested in residential treatment.   Patient Goal: "to get detoxed and get back to [facility] [employer] for subutex."   Reason for Continuation of Hospitalization: Anxiety Depression Medication stabilization Withdrawal symptoms  Estimated Length of Stay: Monday 02/05/17  Attendees: Patient: 02/02/2017 8:14 AM  Physician: Dr. Jackquline Berlin MD 02/02/2017 8:14 AM  Nursing: Maxwell Caul RN 02/02/2017 8:14 AM  RN Care Manager: Onnie Boer CM 02/02/2017 8:14 AM  Social Worker: Chartered loss adjuster, LCSW 02/02/2017 8:14 AM  Recreational Therapist: x 02/02/2017 8:14 AM  Other: Reola Calkins NP 02/02/2017 8:14 AM  Other:  02/02/2017 8:14 AM  Other: 02/02/2017 8:14 AM    Scribe for Treatment Team: Ledell Peoples Smart, LCSW 02/02/2017 8:14 AM

## 2017-02-02 NOTE — Progress Notes (Signed)
[facility] MD Progress Note  02/02/2017 2:25 PM Alyssa Kelley  MRN:  [id number] Subjective:   46 yo Caucasian female, single, unemployed, homeless. Background history of SUD. Self presented seeking help. Has been using cocaine, alcohol, opioids and benzodiazepines regularly. Has been feeling irritable, has had suicidal thoughts off and on. Wants a safe place to come off substances. UDS was positive for cocaine. BAL <5, AST was mildly elevated. Other parameters are essentially normal.  Chart reviewed today. Patient discussed at team today.  Staff reports that has been pleasant. She has been interacting well with peers. No behavioral issues. Some group participation. Has not been observed to be internally stimulated.   Seen today, reports some irritability. Has been ruminating about the effects substances has had in her life. No perceptual abnormalities. No delusional preoccupation. No thoughts of violence. Encouraged that things would get better with time.   Principal Problem: Substance induced mood disorder (HCC) Diagnosis:   Patient Active Problem List   Diagnosis Date Noted  . Opioid use disorder, severe, dependence (HCC) [F11.20] 04/11/2016  . Substance induced mood disorder (HCC) [F19.94] 04/10/2016  . UTI (urinary tract infection) [N39.0] 12/07/2014  . Alcohol dependence with alcohol-induced mood disorder (HCC) [F10.24]   . Major depressive disorder, recurrent episode, severe (HCC) [F33.2] 12/04/2014  . Opioid use disorder, moderate, dependence (HCC) [F11.20] 12/04/2014  . Alcohol use disorder, severe, dependence (HCC) [F10.20] 11/12/2014  . Alcohol abuse [F10.10] 09/10/2013  . Cocaine abuse [F14.10] 09/10/2013   Total Time spent with patient: 20 minutes  Past Psychiatric History: As in H&P  Past Medical History:  Past Medical History:  Diagnosis Date  . Abscess of mouth   . Alcohol abuse   . Anxiety   . Benzodiazepine abuse   . Cocaine abuse   . Depression   . Hepatitis C   .  Heroin abuse   . Hypertension   . 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 CESAREAN SECTION     Family History: History reviewed. No pertinent family history. Family Psychiatric  History: As in H&P Social History:  History  Alcohol Use  . 50.4 oz/week  . 84 Cans of beer per week    Comment: 1 12-pack daiy     History  Drug Use  . Types: Cocaine, Oxycodone, Heroin, Benzodiazepines    Comment: Heroin, benzos    Social History   Social History  . Marital status: Single    Spouse name: N/A  . Number of children: N/A  . Years of education: N/A   Social History Main Topics  . Smoking status: Current Every Day Smoker    Packs/day: 1.00    Years: 15.00    Types: Cigarettes  . Smokeless tobacco: Never Used  . Alcohol use 50.4 oz/week    84 Cans of beer per week     Comment: 1 12-pack daiy  . Drug use: Yes    Types: Cocaine, Oxycodone, Heroin, Benzodiazepines     Comment: Heroin, benzos  . Sexual activity: Yes    Birth control/ protection: None   Other Topics Concern  . None   Social History Narrative  . None   Additional Social History:      Sleep: Good  Appetite:  Fair  Current Medications: Current Facility-Administered Medications  Medication Dose Route Frequency Provider Last Rate Last Dose  . alum & mag hydroxide-simeth (MAALOX/MYLANTA) 200-200-20 MG/5ML suspension 30 mL  30 mL Oral Q4H PRN Donell Sievert  E, PA-C      . chlordiazePOXIDE (LIBRIUM) capsule 25 mg  25 mg Oral BID Malena Timpone, Delight Ovens, MD   25 mg at 02/02/17 0805  . [START ON 02/03/2017] chlordiazePOXIDE (LIBRIUM) capsule 25 mg  25 mg Oral Daily Treshon Stannard A, MD      . chlordiazePOXIDE (LIBRIUM) capsule 25 mg  25 mg Oral QID PRN Rollyn Scialdone A, MD      . cloNIDine (CATAPRES) tablet 0.1 mg  0.1 mg Oral QID Donell Sievert E, PA-C   0.1 mg at 02/02/17 0805   Followed by  . [START ON 02/03/2017] cloNIDine (CATAPRES) tablet 0.1 mg  0.1 mg Oral BH-qamhs  Simon, Spencer E, PA-C       Followed by  . [START ON 02/05/2017] cloNIDine (CATAPRES) tablet 0.1 mg  0.1 mg Oral QAC breakfast Kerry Hough, PA-C      . dicyclomine (BENTYL) tablet 20 mg  20 mg Oral Q6H PRN Donell Sievert E, PA-C      . feeding supplement (ENSURE ENLIVE) (ENSURE ENLIVE) liquid 237 mL  237 mL Oral BID BM Simon, Spencer E, PA-C   237 mL at 02/02/17 1400  . gabapentin (NEURONTIN) capsule 400 mg  400 mg Oral TID Georgiann Cocker, MD   400 mg at 02/02/17 1157  . hydrOXYzine (ATARAX/VISTARIL) tablet 25 mg  25 mg Oral Q6H PRN Donell Sievert E, PA-C   25 mg at 02/02/17 1158  . loperamide (IMODIUM) capsule 2-4 mg  2-4 mg Oral PRN Donell Sievert E, PA-C      . magnesium hydroxide (MILK OF MAGNESIA) suspension 30 mL  30 mL Oral Daily PRN Kerry Hough, PA-C      . methocarbamol (ROBAXIN) tablet 500 mg  500 mg Oral Q8H PRN Kerry Hough, PA-C      . multivitamin with minerals tablet 1 tablet  1 tablet Oral Daily Kerry Hough, PA-C   1 tablet at 02/02/17 0805  . naproxen (NAPROSYN) tablet 500 mg  500 mg Oral BID PRN Kerry Hough, PA-C      . nicotine (NICODERM CQ - dosed in mg/24 hours) patch 21 mg  21 mg Transdermal Daily Donell Sievert E, PA-C   21 mg at 02/02/17 0805  . ondansetron (ZOFRAN-ODT) disintegrating tablet 4 mg  4 mg Oral Q6H PRN Kerry Hough, PA-C   4 mg at 02/02/17 1158  . PARoxetine (PAXIL) tablet 40 mg  40 mg Oral Daily Dayshia Ballinas, Delight Ovens, MD   40 mg at 02/02/17 0805  . thiamine (VITAMIN B-1) tablet 100 mg  100 mg Oral Daily Kerry Hough, PA-C   100 mg at 02/02/17 3361  . traZODone (DESYREL) tablet 50 mg  50 mg Oral QHS,MR X 1 Kerry Hough, PA-C   50 mg at 02/01/17 2213    Lab Results:  Results for orders placed or performed during the hospital encounter of 02/01/17 (from the past 48 hour(s))  Pregnancy, urine     Status: None   Collection Time: 02/01/17  1:13 PM  Result Value Ref Range   Preg Test, Ur NEGATIVE NEGATIVE    Comment:         THE SENSITIVITY OF THIS METHODOLOGY IS >20 mIU/mL. Performed at [facility], [address], [place], [place] [address]     Blood Alcohol level:  Lab Results  Component Value Date   [facility] <5 01/31/2017   ETH <5 04/09/2016    Metabolic Disorder Labs: No  results found for: HGBA1C, MPG No results found for: PROLACTIN No results found for: CHOL, TRIG, HDL, CHOLHDL, VLDL, LDLCALC  Physical Findings: AIMS: Facial and Oral Movements Muscles of Facial Expression: None, normal Lips and Perioral Area: None, normal Jaw: None, normal Tongue: None, normal,Extremity Movements Upper (arms, wrists, hands, fingers): None, normal Lower (legs, knees, ankles, toes): None, normal, Trunk Movements Neck, shoulders, hips: None, normal, Overall Severity Severity of abnormal movements (highest score from questions above): None, normal Incapacitation due to abnormal movements: None, normal Patient's awareness of abnormal movements (rate only patient's report): No Awareness, Dental Status Current problems with teeth and/or dentures?: No Does patient usually wear dentures?: No  CIWA:  CIWA-Ar Total: 2 COWS:  COWS Total Score: 0  Musculoskeletal: Strength & Muscle Tone: within normal limits Gait & Station: normal Patient leans: N/A  Psychiatric Specialty Exam: Physical Exam  Constitutional: She is oriented to person, place, and time. She appears well-developed and well-nourished.  HENT:  Head: Normocephalic and atraumatic.  Neck: Neck supple.  Respiratory: Effort normal.  Neurological: She is alert and oriented to person, place, and time.  Skin: Skin is warm and dry.  Psychiatric:  As above    ROS  Blood pressure (!) 84/70, pulse 63, temperature 98.6 F (37 C), temperature source Oral, resp. rate 20, height 5\' 1"  (1.549 m), weight 81.6 kg (179 lb 15.7 oz), last menstrual period 11/27/2015, SpO2 100 %.Body mass index is 34.01 kg/m.  General Appearance: In bed, withdrawn,  not internally stimulated.   Eye Contact:  Good  Speech:  Soft spoke  Volume:  Decreased  Mood:  Depressed and Irritable  Affect:  Flat  Thought Process:  Linear  Orientation:  Full (Time, Place, and Person)  Thought Content:  Rumination  Suicidal Thoughts:  No  Homicidal Thoughts:  No  Memory:  Immediate;   Fair Recent;   Fair Remote;   Fair  Judgement:  Fair  Insight:  Good  Psychomotor Activity:  Decreased  Concentration:  Concentration: Fair and Attention Span: Fair  Recall:  [employer] of Knowledge:  Fair  Language:  Good  Akathisia:  Negative  Handed:    AIMS (if indicated):     Assets:  Desire for Improvement Resilience  ADL's:  Intact  Cognition:  WNL  Sleep:  Number of Hours: 2.75 (early morning admission)    Assessment and Plan  Patient is coming off psychoactive substances. Dysphoria and insomnia is related to this. No associated psychosis. No associated suicidal thoughts. We plan to evlaute her over the weekend. Hopeful discharge early next week.   Psychiatric: SUD SIMD  Medical: HTN BET  Psychosocial:  Homelessness Limited support Unemployment  PLAN: 1. Add Propranolol 20 mg TID  2. Continue to monitor mood, behavior and interaction with peers 3. Encourage groups as she gets better.    Georgiann Cocker, MD 02/02/2017, 2:25 PM

## 2017-02-02 NOTE — [facility] Group Notes (Signed)
[facility] LCSW Group Therapy  02/02/2017 3:11 PM  Type of Therapy:  Group Therapy  Participation Level:  Active  Participation Quality:  Attentive  Affect:  Appropriate  Cognitive:  Alert and Oriented  Insight:  Improving  Engagement in Therapy:  Improving  Modes of Intervention:  Discussion, Education, Exploration, Socialization and Support  Summary of Progress/Problems: Feelings around Relapse. Group members discussed the meaning of relapse and shared personal stories of relapse, how it affected them and others, and how they perceived themselves during this time. Group members were encouraged to identify triggers, warning signs and coping skills used when facing the possibility of relapse. Social supports were discussed and explored in detail. Post Acute Withdrawal Syndrome (handout provided) was introduced and examined. Pt's were encouraged to ask questions, talk about key points associated with PAWS, and process this information in terms of relapse prevention.   Aundrey Elahi N Smart LCSW 02/02/2017, 3:11 PM

## 2017-02-02 NOTE — Progress Notes (Addendum)
Nursing Progress Note [id number]  D) Patient presents with flat affect and depressed/sad mood. Patient is isolative to her room and does not interact much with peers. Patient is pleasant and cooperative with Clinical research associate but minimal. Patient is medication compliant. Patient reports passive SI but no plan. Patient denies HI/AVH or pain. Patient contracts for safety on the unit. Multiple pitchers of gatorade provided and patient encouraged to drink fluids. BP monitored and WNL this evening.  A) Emotional support given. 1:1 interaction and active listening provided. Patient medicated as prescribed. Medications and plan of care reviewed with patient. Patient verbalized understanding without further questions. Snacks and fluids provided. Opportunities for questions or concerns presented to patient. Patient encouraged to continue to work on treatment goals. Labs, vital signs and patient behavior monitored throughout shift. Patient safety maintained with q15 min safety checks. Low fall risk precautions in place and reviewed with patient; patient verbalized understanding.  R) Patient receptive to interaction with nurse. Patient remains safe on the unit at this time. Patient denies any adverse medication reactions at this time. Patient is resting in bed without complaints. Will continue to monitor.

## 2017-02-02 NOTE — Progress Notes (Signed)
D Pt is OB UAL on the 300 hall today she tolerates this well. She presents as cooperative, pleasant and she completes her daily assessment this am and on it she wrote she  Has experienced SI today and she contracts for safety with this Clinical research associate. She rates her depression, hopelessness and anxiety " 10/ 10", respectively. R Safety in place and pt requests prn zofran an vistaril for c/o nausea and axiety. Meds given per MD order.

## 2017-02-02 NOTE — Progress Notes (Signed)
The patient attended the evening A.A.meeting and was appropriate.  

## 2017-02-02 NOTE — Plan of Care (Signed)
Problem: Safety: Goal: Periods of time without injury will increase Outcome: Progressing Patient is on q15 minute safety checks and low fall risk precautions. Patient contracts for safety on the unit.

## 2017-02-02 NOTE — Progress Notes (Signed)
Pt interested in [facility] referral-referral made and message left for Marylene Land in admissions. Message also left for scheduler at [facility] care per patient request. She is interested in beginning subutex maintenance at discharge.  Trula Slade, MSW, LCSW Clinical Social Worker 02/02/2017 3:38 PM

## 2017-02-03 MED ORDER — CHLORDIAZEPOXIDE HCL 25 MG PO CAPS
25.0000 mg | ORAL_CAPSULE | Freq: Two times a day (BID) | ORAL | Status: DC | PRN
  Administered 2017-02-03 – 2017-02-07 (×9): 25 mg via ORAL
  Filled 2017-02-03 (×9): qty 1

## 2017-02-03 MED ORDER — HYDROXYZINE HCL 50 MG PO TABS
50.0000 mg | ORAL_TABLET | Freq: Three times a day (TID) | ORAL | Status: DC | PRN
  Administered 2017-02-03 – 2017-02-07 (×9): 50 mg via ORAL
  Filled 2017-02-03 (×9): qty 1
  Filled 2017-02-03: qty 10

## 2017-02-03 NOTE — [facility] Suicide Risk Assessment (Addendum)
[facility] INPATIENT:  Family/Significant Other Suicide Prevention Education  Suicide Prevention Education:  Contact Attempts: Esperanza Heir, aunt, [phone]  Or  cousin Wilford Sports [phone] has been identified by the patient as the family member/significant other with whom the patient will be residing, and identified as the person(s) who will aid the patient in the event of a mental health crisis.  With written consent from the patient, two attempts were made to provide suicide prevention education, prior to and/or following the patient's discharge.  We were unsuccessful in providing suicide prevention education.  A suicide education pamphlet was given to the patient to share with family/significant other.  Date and time of first attempt:  02/03/2017  /  4:25 PM     And 02/04/2017  /  4:20 PM (with aunt - could not leave message, generic voicemail) Date and time of second attempt:  02/03/2017   /  4:46 PM (with cousin - could not leave message, generic voicemail)  Alyssa Kelley 02/03/2017, 4:32 PM

## 2017-02-03 NOTE — [facility] Group Notes (Signed)
[facility] LCSW Group Therapy Note  02/03/17  At 10:20 to 11:15 AM  Type of Therapy and Topic:  Group Therapy: Avoiding Self-Sabotaging and Enabling Behaviors  Participation Level:  Active   Description of Group The main focus of today's process group to discuss what "self-sabotage" means and use motivational iInterviewing to discuss what benefits, negative or positive, were involved in a self-identified self-sabotaging behavior. We then talked about reasons the patient may want to change the behavior and their current desire to change.   Summary of Patient Progress: Patient engaged easily and identified with multiple forms of self sabotage; especially choosing the wrong people to associate with. Patient was alert, engaged and supportive of others.    Therapeutic molalities: Cognitive Behavioral Therapy Person-Centered Therapy Motivational Interviewing  Therapeutic Goals: 1. Patients will demonstrate understanding of the concept of self sabotage 2. Patients will be able to identify pros and cons of their behaviors 3. Patients will be able to identify at least two motivating factors for l of their desire for change   Carney Bern, LCSW

## 2017-02-03 NOTE — Progress Notes (Signed)
[facility] MD Progress Note  02/03/2017 12:58 PM Alyssa Kelley  MRN:  [id number] Subjective:   46 yo Caucasian female, single, unemployed, homeless. Background history of SUD. Self presented seeking help. Has been using cocaine, alcohol, opioids and benzodiazepines regularly. Has been feeling irritable, has had suicidal thoughts off and on. Wants a safe place to come off substances. UDS was positive for cocaine. BAL <5, AST was mildly elevated. Other parameters are essentially normal.  Chart reviewed today. Patient discussed at team today.  Staff reports that she attended AA yesterday. She is more pleasant and more engaging. Continue to report anxiety and depression. Focused on getting into the BATS programe  Seen today, still coming off substances. Says she still has some irritability and cravings. She is coming out a bit more. Ate well this afternoon. No nausea or vomiting. Still thinks about the negative things that has happened in her life. No positive thoughts lately. No suicidal thoughts. Wants to get better.   Principal Problem: Substance induced mood disorder (HCC) Diagnosis:   Patient Active Problem List   Diagnosis Date Noted  . Opioid use disorder, severe, dependence (HCC) [F11.20] 04/11/2016  . Substance induced mood disorder (HCC) [F19.94] 04/10/2016  . UTI (urinary tract infection) [N39.0] 12/07/2014  . Alcohol dependence with alcohol-induced mood disorder (HCC) [F10.24]   . Major depressive disorder, recurrent episode, severe (HCC) [F33.2] 12/04/2014  . Opioid use disorder, moderate, dependence (HCC) [F11.20] 12/04/2014  . Alcohol use disorder, severe, dependence (HCC) [F10.20] 11/12/2014  . Alcohol abuse [F10.10] 09/10/2013  . Cocaine abuse [F14.10] 09/10/2013   Total Time spent with patient: 20 minutes  Past Psychiatric History: As in H&P  Past Medical History:  Past Medical History:  Diagnosis Date  . Abscess of mouth   . Alcohol abuse   . Anxiety   . Benzodiazepine  abuse   . Cocaine abuse   . Depression   . Hepatitis C   . Heroin abuse   . Hypertension   . 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 CESAREAN SECTION     Family History: History reviewed. No pertinent family history. Family Psychiatric  History: As in H&P Social History:  History  Alcohol Use  . 50.4 oz/week  . 84 Cans of beer per week    Comment: 1 12-pack daiy     History  Drug Use  . Types: Cocaine, Oxycodone, Heroin, Benzodiazepines    Comment: Heroin, benzos    Social History   Social History  . Marital status: Single    Spouse name: N/A  . Number of children: N/A  . Years of education: N/A   Social History Main Topics  . Smoking status: Current Every Day Smoker    Packs/day: 1.00    Years: 15.00    Types: Cigarettes  . Smokeless tobacco: Never Used  . Alcohol use 50.4 oz/week    84 Cans of beer per week     Comment: 1 12-pack daiy  . Drug use: Yes    Types: Cocaine, Oxycodone, Heroin, Benzodiazepines     Comment: Heroin, benzos  . Sexual activity: Yes    Birth control/ protection: None   Other Topics Concern  . None   Social History Narrative  . None   Additional Social History:      Sleep: Good  Appetite:  Better  Current Medications: Current Facility-Administered Medications  Medication Dose Route Frequency Provider Last Rate Last Dose  . alum &  mag hydroxide-simeth (MAALOX/MYLANTA) 200-200-20 MG/5ML suspension 30 mL  30 mL Oral Q4H PRN Donell Sievert E, PA-C      . chlordiazePOXIDE (LIBRIUM) capsule 25 mg  25 mg Oral QID PRN Georgiann Cocker, MD   25 mg at 02/02/17 2154  . cloNIDine (CATAPRES) tablet 0.1 mg  0.1 mg Oral BH-qamhs Donell Sievert E, PA-C   0.1 mg at 02/03/17 4098   Followed by  . [START ON 02/05/2017] cloNIDine (CATAPRES) tablet 0.1 mg  0.1 mg Oral QAC breakfast Kerry Hough, PA-C      . dicyclomine (BENTYL) tablet 20 mg  20 mg Oral Q6H PRN Donell Sievert E, PA-C      . feeding  supplement (ENSURE ENLIVE) (ENSURE ENLIVE) liquid 237 mL  237 mL Oral BID BM Simon, Spencer E, PA-C   237 mL at 02/02/17 1400  . gabapentin (NEURONTIN) capsule 400 mg  400 mg Oral TID Georgiann Cocker, MD   400 mg at 02/03/17 1158  . hydrOXYzine (ATARAX/VISTARIL) tablet 25 mg  25 mg Oral Q6H PRN Donell Sievert E, PA-C   25 mg at 02/03/17 1158  . loperamide (IMODIUM) capsule 2-4 mg  2-4 mg Oral PRN Donell Sievert E, PA-C      . magnesium hydroxide (MILK OF MAGNESIA) suspension 30 mL  30 mL Oral Daily PRN Kerry Hough, PA-C      . methocarbamol (ROBAXIN) tablet 500 mg  500 mg Oral Q8H PRN Kerry Hough, PA-C      . multivitamin with minerals tablet 1 tablet  1 tablet Oral Daily Kerry Hough, PA-C   1 tablet at 02/03/17 1191  . naproxen (NAPROSYN) tablet 500 mg  500 mg Oral BID PRN Kerry Hough, PA-C      . nicotine (NICODERM CQ - dosed in mg/24 hours) patch 21 mg  21 mg Transdermal Daily Donell Sievert E, PA-C   21 mg at 02/03/17 0834  . ondansetron (ZOFRAN-ODT) disintegrating tablet 4 mg  4 mg Oral Q6H PRN Kerry Hough, PA-C   4 mg at 02/03/17 4782  . PARoxetine (PAXIL) tablet 40 mg  40 mg Oral Daily Uilani Sanville, Delight Ovens, MD   40 mg at 02/03/17 0834  . propranolol (INDERAL) tablet 20 mg  20 mg Oral TID Georgiann Cocker, MD   20 mg at 02/03/17 1158  . thiamine (VITAMIN B-1) tablet 100 mg  100 mg Oral Daily Donell Sievert E, PA-C   100 mg at 02/03/17 9562  . traZODone (DESYREL) tablet 50 mg  50 mg Oral QHS,MR X 1 Kerry Hough, PA-C   50 mg at 02/02/17 2156    Lab Results:  Results for orders placed or performed during the hospital encounter of 02/01/17 (from the past 48 hour(s))  Pregnancy, urine     Status: None   Collection Time: 02/01/17  1:13 PM  Result Value Ref Range   Preg Test, Ur NEGATIVE NEGATIVE    Comment:        THE SENSITIVITY OF THIS METHODOLOGY IS >20 mIU/mL. Performed at [facility], [address] [place], [place] [address]      Blood Alcohol level:  Lab Results  Component Value Date   ETH <5 01/31/2017   ETH <5 04/09/2016    Metabolic Disorder Labs: No results found for: HGBA1C, MPG No results found for: PROLACTIN No results found for: CHOL, TRIG, HDL, CHOLHDL, VLDL, LDLCALC  Physical Findings: AIMS: Facial and Oral Movements Muscles of Facial  Expression: None, normal Lips and Perioral Area: None, normal Jaw: None, normal Tongue: None, normal,Extremity Movements Upper (arms, wrists, hands, fingers): None, normal Lower (legs, knees, ankles, toes): None, normal, Trunk Movements Neck, shoulders, hips: None, normal, Overall Severity Severity of abnormal movements (highest score from questions above): None, normal Incapacitation due to abnormal movements: None, normal Patient's awareness of abnormal movements (rate only patient's report): No Awareness, Dental Status Current problems with teeth and/or dentures?: No Does patient usually wear dentures?: No  CIWA:  CIWA-Ar Total: 0 COWS:  COWS Total Score: 0  Musculoskeletal: Strength & Muscle Tone: within normal limits Gait & Station: normal Patient leans: N/A  Psychiatric Specialty Exam: Physical Exam  Constitutional: She is oriented to person, place, and time. She appears well-developed and well-nourished.  HENT:  Head: Normocephalic and atraumatic.  Neck: Neck supple.  Respiratory: Effort normal.  Neurological: She is alert and oriented to person, place, and time.  Skin: Skin is warm and dry.  Psychiatric:  As above    ROS  Blood pressure 100/66, pulse 65, temperature 98.2 F (36.8 C), temperature source Oral, resp. rate 16, height 5\' 1"  (1.549 m), weight 81.6 kg (179 lb 15.7 oz), last menstrual period 11/27/2015, SpO2 100 %.Body mass index is 34.01 kg/m.  General Appearance: In bed, withdrawn, not internally stimulated. Better rapport today. No tremors, not sweaty.   Eye Contact:  Good  Speech:  Spontaneous. Normal rate and tone   Volume:  Soft spoken  Mood:  Still down  Affect:  Restricted.  Thought Process:  Linear  Orientation:  Full (Time, Place, and Person)  Thought Content:  Negative ruminations about her past. No thoughts of violence. No hallucination in any modality.   Suicidal Thoughts:  No  Homicidal Thoughts:  No  Memory:  Immediate;   Fair Recent;   Fair Remote;   Fair  Judgement:  Fair  Insight:  Good  Psychomotor Activity:  Decreased  Concentration:  Concentration: Fair and Attention Span: Fair  Recall:  [employer] of Knowledge:  Fair  Language:  Good  Akathisia:  Negative  Handed:    AIMS (if indicated):     Assets:  Desire for Improvement Resilience  ADL's:  Intact  Cognition:  WNL  Sleep:  Number of Hours: 6.5    Assessment and Plan  Patient is still coming off psychoactive substances.  No associated psychosis. No associated suicidal thoughts. She would hopefully turn around in a day or two. We anticipate discharge early next week.  Psychiatric: SUD SIMD  Medical: HTN BET  Psychosocial:  Homelessness Limited support Unemployment  PLAN: 1. Continue current regimen  2. Continue to monitor mood, behavior and interaction with peers    Georgiann Cocker, MD 02/03/2017, 12:58 PMPatient ID: Alyssa Kelley, female   DOB: 28-Jul-1970, 46 y.o.   MRN: [id number]

## 2017-02-03 NOTE — Progress Notes (Signed)
The patient attended the evening A.A. Meeting and was appropriate.  

## 2017-02-03 NOTE — Progress Notes (Signed)
D Pt is seen OOB UAL on the 300 hall today...she tolerates this well. She is pleasant, cooperative and she takes the scheduled meds as planned. A She completes her daily assessment and on this she wrote she has experienced SI today but she contracts verbally with writer to not hurt herself today. She rates her depression, hoepelssness and anxiety " 15/20/20", respectively. She requested and was given prn zofran ( for nausea) and prn vistaril ( for anxiety). R Pt stated releif after ptns. Safety maintained. She is thinking about attenting [facility] program [facility].

## 2017-02-04 MED ORDER — PAROXETINE HCL 20 MG PO TABS
60.0000 mg | ORAL_TABLET | Freq: Every day | ORAL | Status: DC
  Administered 2017-02-05 – 2017-02-07 (×3): 60 mg via ORAL
  Filled 2017-02-04: qty 2
  Filled 2017-02-04: qty 21
  Filled 2017-02-04: qty 2
  Filled 2017-02-04 (×2): qty 3
  Filled 2017-02-04: qty 2

## 2017-02-04 NOTE — Progress Notes (Signed)
D Pt is seen standing at the med window this morning at 0800. She says " I just feel AWFUL" A SHe completes her daily assessment and on this she writes she has experienced SI today and she rates her depression, hopelessness and anxiety " 03/28/09", respectively. She says " I don't know what's wrong with me..ibuprofen couldn' t sleep last night and I feel awful today. She is tremulous, she is swaty, she has " pain all over..I  hurt", she is tremulous, she is nervous, anxious and she cannot do serial addition. R Safety is in  Place and pt is medicated with prns per MD order and pt need.

## 2017-02-04 NOTE — Progress Notes (Signed)
D   Pt is pleasant on approach   She does endorse depression and anxiety    She asks for as much medication as she can have    She reports having some withdrawal symptoms including cravings anxiety and sleeplessness    She interacts well with other patients and is compliant with treatment A   Verbal support given   Medications administered and effectiveness monitored    Q 15 min checks   Discussed withdrawal symptoms  R   Pt is safe and responsive to verbal support 

## 2017-02-04 NOTE — Progress Notes (Signed)
[facility] MD Progress Note  02/04/2017 3:41 PM Alyssa Kelley  MRN:  [id number] Subjective:   46 yo Caucasian female, single, unemployed, homeless. Background history of SUD. Self presented seeking help. Has been using cocaine, alcohol, opioids and benzodiazepines regularly. Has been feeling irritable, has had suicidal thoughts off and on. Wants a safe place to come off substances. UDS was positive for cocaine. BAL <5, AST was mildly elevated. Other parameters are essentially normal.  Chart reviewed today. Patient discussed at team today.  Staff reports that she complains of feeling awful at times. She has been observed to be pleasant mostly. She has participated with some of the unit groups and activities. No behavioral issues. Vitals are normal though  she is still scoring high on CIWA and COWS.  Seen today, in her room while peers were out for recreation. Says she is worried about placement. Says she had been living under a bridge. Not sure where she would be staying when she gets out here. Gets anxious and feels hopeless when she thinks about it. Has passive death wish but denies any suicidal plans. Says she has been trying to get into the shelter but most of them has been full. Hopes we could help her find one. Hopes she can get into [facility] program. No abnormal perception. No delusional preoccupation. No thoughts of violence. She has been reading at times. Shared the story line with me. Describes limited concentration as her mind wanders off into the negatives in her life.   Principal Problem: Substance induced mood disorder (HCC) Diagnosis:   Patient Active Problem List   Diagnosis Date Noted  . Opioid use disorder, severe, dependence (HCC) [F11.20] 04/11/2016  . Substance induced mood disorder (HCC) [F19.94] 04/10/2016  . UTI (urinary tract infection) [N39.0] 12/07/2014  . Alcohol dependence with alcohol-induced mood disorder (HCC) [F10.24]   . Major depressive disorder, recurrent episode, severe  (HCC) [F33.2] 12/04/2014  . Opioid use disorder, moderate, dependence (HCC) [F11.20] 12/04/2014  . Alcohol use disorder, severe, dependence (HCC) [F10.20] 11/12/2014  . Alcohol abuse [F10.10] 09/10/2013  . Cocaine abuse [F14.10] 09/10/2013   Total Time spent with patient: 20 minutes  Past Psychiatric History: As in H&P  Past Medical History:  Past Medical History:  Diagnosis Date  . Abscess of mouth   . Alcohol abuse   . Anxiety   . Benzodiazepine abuse   . Cocaine abuse   . Depression   . Hepatitis C   . Heroin abuse   . Hypertension   . 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 CESAREAN SECTION     Family History: History reviewed. No pertinent family history. Family Psychiatric  History: As in H&P Social History:  History  Alcohol Use  . 50.4 oz/week  . 84 Cans of beer per week    Comment: 1 12-pack daiy     History  Drug Use  . Types: Cocaine, Oxycodone, Heroin, Benzodiazepines    Comment: Heroin, benzos    Social History   Social History  . Marital status: Single    Spouse name: N/A  . Number of children: N/A  . Years of education: N/A   Social History Main Topics  . Smoking status: Current Every Day Smoker    Packs/day: 1.00    Years: 15.00    Types: Cigarettes  . Smokeless tobacco: Never Used  . Alcohol use 50.4 oz/week    84 Cans of beer per week  Comment: 1 12-pack daiy  . Drug use: Yes    Types: Cocaine, Oxycodone, Heroin, Benzodiazepines     Comment: Heroin, benzos  . Sexual activity: Yes    Birth control/ protection: None   Other Topics Concern  . None   Social History Narrative  . None   Additional Social History:      Sleep: Good  Appetite:  Good Current Medications: Current Facility-Administered Medications  Medication Dose Route Frequency Provider Last Rate Last Dose  . alum & mag hydroxide-simeth (MAALOX/MYLANTA) 200-200-20 MG/5ML suspension 30 mL  30 mL Oral Q4H PRN Donell Sievert  E, PA-C   30 mL at 02/04/17 1212  . chlordiazePOXIDE (LIBRIUM) capsule 25 mg  25 mg Oral BID PRN Georgiann Cocker, MD   25 mg at 02/04/17 0922  . cloNIDine (CATAPRES) tablet 0.1 mg  0.1 mg Oral BH-qamhs Donell Sievert E, PA-C   0.1 mg at 02/04/17 0919   Followed by  . [START ON 02/05/2017] cloNIDine (CATAPRES) tablet 0.1 mg  0.1 mg Oral QAC breakfast Kerry Hough, PA-C      . dicyclomine (BENTYL) tablet 20 mg  20 mg Oral Q6H PRN Donell Sievert E, PA-C      . feeding supplement (ENSURE ENLIVE) (ENSURE ENLIVE) liquid 237 mL  237 mL Oral BID BM Simon, Spencer E, PA-C   237 mL at 02/02/17 1400  . gabapentin (NEURONTIN) capsule 400 mg  400 mg Oral TID Georgiann Cocker, MD   400 mg at 02/04/17 1212  . hydrOXYzine (ATARAX/VISTARIL) tablet 50 mg  50 mg Oral TID PRN Money, Gerlene Burdock, FNP   50 mg at 02/04/17 1212  . magnesium hydroxide (MILK OF MAGNESIA) suspension 30 mL  30 mL Oral Daily PRN Kerry Hough, PA-C      . methocarbamol (ROBAXIN) tablet 500 mg  500 mg Oral Q8H PRN Donell Sievert E, PA-C   500 mg at 02/04/17 1610  . multivitamin with minerals tablet 1 tablet  1 tablet Oral Daily Kerry Hough, PA-C   1 tablet at 02/04/17 9604  . naproxen (NAPROSYN) tablet 500 mg  500 mg Oral BID PRN Kerry Hough, PA-C   500 mg at 02/04/17 1212  . nicotine (NICODERM CQ - dosed in mg/24 hours) patch 21 mg  21 mg Transdermal Daily Donell Sievert E, PA-C   21 mg at 02/04/17 5409  . PARoxetine (PAXIL) tablet 40 mg  40 mg Oral Daily Izediuno, Delight Ovens, MD   40 mg at 02/04/17 0918  . propranolol (INDERAL) tablet 20 mg  20 mg Oral TID Georgiann Cocker, MD   20 mg at 02/04/17 1212  . thiamine (VITAMIN B-1) tablet 100 mg  100 mg Oral Daily Donell Sievert E, PA-C   100 mg at 02/04/17 0919  . traZODone (DESYREL) tablet 50 mg  50 mg Oral QHS,MR X 1 Kerry Hough, PA-C   50 mg at 02/03/17 2151    Lab Results:  No results found for this or any previous visit (from the past 48 hour(s)).  Blood  Alcohol level:  Lab Results  Component Value Date   ETH <5 01/31/2017   ETH <5 04/09/2016    Metabolic Disorder Labs: No results found for: HGBA1C, MPG No results found for: PROLACTIN No results found for: CHOL, TRIG, HDL, CHOLHDL, VLDL, LDLCALC  Physical Findings: AIMS: Facial and Oral Movements Muscles of Facial Expression: None, normal Lips and Perioral Area: None, normal Jaw: None, normal Tongue: None, normal,Extremity Movements  Upper (arms, wrists, hands, fingers): None, normal Lower (legs, knees, ankles, toes): None, normal, Trunk Movements Neck, shoulders, hips: None, normal, Overall Severity Severity of abnormal movements (highest score from questions above): None, normal Incapacitation due to abnormal movements: None, normal Patient's awareness of abnormal movements (rate only patient's report): No Awareness, Dental Status Current problems with teeth and/or dentures?: No Does patient usually wear dentures?: No  CIWA:  CIWA-Ar Total: 9 COWS:  COWS Total Score: 11  Musculoskeletal: Strength & Muscle Tone: within normal limits Gait & Station: normal Patient leans: N/A  Psychiatric Specialty Exam: Physical Exam  Constitutional: She is oriented to person, place, and time. She appears well-developed and well-nourished.  HENT:  Head: Normocephalic and atraumatic.  Neck: Neck supple.  Respiratory: Effort normal.  Neurological: She is alert and oriented to person, place, and time.  Skin: Skin is warm and dry.  Psychiatric:  As above    ROS  Blood pressure 119/77, pulse 73, temperature 99.8 F (37.7 C), temperature source Oral, resp. rate 16, height 5\' 1"  (1.549 m), weight 81.6 kg (179 lb 15.7 oz), last menstrual period 11/27/2015, SpO2 100 %.Body mass index is 34.01 kg/m.  General Appearance: In bed, withdrawn, not internally stimulated. Better rapport today. No tremors, not sweaty.   Eye Contact:  Good  Speech:  Spontaneous. Normal rate and tone  Volume:  Soft  spoken  Mood:  Depressed and anxious  Affect:  Restricted but was able to mobilize some positive affect  Thought Process:  Linear  Orientation:  Full (Time, Place, and Person)  Thought Content:  Negative ruminations her future. No thoughts of violence. No hallucination in any modality.   Suicidal Thoughts:  No  Homicidal Thoughts:  No  Memory:  WNL  Judgement:  Better  Insight:  Good  Psychomotor Activity:  Decreased  Concentration:  Better  Recall:  WNL  Fund of Knowledge:  Fair  Language:  Good  Akathisia:  Negative  Handed:    AIMS (if indicated):     Assets:  Desire for Improvement Resilience  ADL's:  Intact  Cognition:  WNL  Sleep:  Number of Hours: 6    Assessment and Plan  Depression and anxiety related to psychosocial situation. Still scores high on withdrawal scales. Probably  confounded by baseline depression and anxiety. Hopeful once she is assured of shelter. Hopelessness is in context of being homeless. We have agreed to adjust her antidepressant further. Hopeful discharge early this week once we have appropriate placement.  Psychiatric: SUD SIMD  Medical: HTN BET  Psychosocial:  Homelessness Limited support Unemployment  PLAN: 1. Increase Paxil to 60 mg daily 2. Continue to monitor mood, behavior and interaction with peers    Georgiann Cocker, MD 02/04/2017, 3:41 PMPatient ID: Alyssa Kelley, female   DOB: 1971-06-18, 46 y.o.   MRN: [id number] Patient ID: Alyssa Kelley, female   DOB: 1970-07-01, 46 y.o.   MRN: [id number]

## 2017-02-04 NOTE — Progress Notes (Signed)
Patient did not attend the evening speaker AA meeting. Pt was notified that group was beginning but returned to room.  

## 2017-02-04 NOTE — [facility] Group Notes (Signed)
[facility] LCSW Group Therapy Note     02/04/2017 10:15 to 11:15 AM  Type of Therapy and Topic: Group Therapy: Feelings Around Returning Home & Establishing a Supportive Framework and Supporting Oneself When Supports Not Available  Participation Level: Active    Description of Group:  Patients first processed thoughts and feelings about up coming discharge. These included fears of upcoming changes, lack of change, new living environments, judgements and expectations from others and overall stigma of MH issues. We then discussed what is a supportive framework? What does it look like feel like and how do I discern it from and unhealthy non-supportive network? Learn how to cope when supports are not helpful and don't support you. Discuss what to do when your family/friends are not supportive.   Therapeutic Goals Addressed in Processing Group:  1. Patient will identify one healthy supportive network that they can use at discharge. 2. Patient will identify one factor of a supportive framework and how to tell it from an unhealthy network. 3. Patient able to identify one coping skill to use when they do not have positive supports from others. 4. Patient will demonstrate ability to communicate their needs through discussion and/or role plays.  Summary of Patient Progress:  Pt engaged easily during group session. As patients processed their anxiety about discharge and described healthy supports patient processed her issues with isolation and worry.  Patients identified at least one self care tool they were willing to use after discharge.   Carney Bern, LCSW

## 2017-02-04 NOTE — Progress Notes (Signed)
D   Pt is pleasant on approach   She does endorse depression and anxiety    She asks for as much medication as she can have    She reports having some withdrawal symptoms including cravings anxiety and sleeplessness    She interacts well with other patients and is compliant with treatment  She reports today has been worse for her and is feeling worse physically and mentally A   Verbal support given   Medications administered and effectiveness monitored    Q 15 min checks   Discussed withdrawal symptoms  R   Pt is safe and responsive to verbal support

## 2017-02-05 NOTE — Progress Notes (Signed)
[facility] MD Progress Note  02/05/2017 5:09 PM Alyssa Kelley  MRN:  [id number] Subjective:   46 yo Caucasian female, single, unemployed, homeless. Background history of SUD. Self presented seeking help. Has been using cocaine, alcohol, opioids and benzodiazepines regularly. Has been feeling irritable, has had suicidal thoughts off and on. Wants a safe place to come off substances. UDS was positive for cocaine. BAL <5, AST was mildly elevated. Other parameters are essentially normal.  Chart reviewed today. Patient discussed at team today.  Staff reports that she has been withdrawn until she got the news about [facility]. No objective withdrawals from substances. No behavioral issues. Has been sleeping well. Vitals has been stable.   Seen today, pleased to learn that she is likely getting into [facility]. Says she is is okay with Methadone rather than  Suboxone. Hopelessness and worthlessness is less. No suicidal thoughts. No craving for substances. No evidence of psychosis. No evidence of mania.   Principal Problem: Substance induced mood disorder (HCC) Diagnosis:   Patient Active Problem List   Diagnosis Date Noted  . Opioid use disorder, severe, dependence (HCC) [F11.20] 04/11/2016  . Substance induced mood disorder (HCC) [F19.94] 04/10/2016  . UTI (urinary tract infection) [N39.0] 12/07/2014  . Alcohol dependence with alcohol-induced mood disorder (HCC) [F10.24]   . Major depressive disorder, recurrent episode, severe (HCC) [F33.2] 12/04/2014  . Opioid use disorder, moderate, dependence (HCC) [F11.20] 12/04/2014  . Alcohol use disorder, severe, dependence (HCC) [F10.20] 11/12/2014  . Alcohol abuse [F10.10] 09/10/2013  . Cocaine abuse [F14.10] 09/10/2013   Total Time spent with patient: 20 minutes  Past Psychiatric History: As in H&P  Past Medical History:  Past Medical History:  Diagnosis Date  . Abscess of mouth   . Alcohol abuse   . Anxiety   . Benzodiazepine abuse   . Cocaine abuse   .  Depression   . Hepatitis C   . Heroin abuse   . Hypertension   . 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 CESAREAN SECTION     Family History: History reviewed. No pertinent family history. Family Psychiatric  History: As in H&P Social History:  History  Alcohol Use  . 50.4 oz/week  . 84 Cans of beer per week    Comment: 1 12-pack daiy     History  Drug Use  . Types: Cocaine, Oxycodone, Heroin, Benzodiazepines    Comment: Heroin, benzos    Social History   Social History  . Marital status: Single    Spouse name: N/A  . Number of children: N/A  . Years of education: N/A   Social History Main Topics  . Smoking status: Current Every Day Smoker    Packs/day: 1.00    Years: 15.00    Types: Cigarettes  . Smokeless tobacco: Never Used  . Alcohol use 50.4 oz/week    84 Cans of beer per week     Comment: 1 12-pack daiy  . Drug use: Yes    Types: Cocaine, Oxycodone, Heroin, Benzodiazepines     Comment: Heroin, benzos  . Sexual activity: Yes    Birth control/ protection: None   Other Topics Concern  . None   Social History Narrative  . None   Additional Social History:      Sleep: Good  Appetite:  Good Current Medications: Current Facility-Administered Medications  Medication Dose Route Frequency Provider Last Rate Last Dose  . alum & mag hydroxide-simeth (MAALOX/MYLANTA) 200-200-20 MG/5ML suspension  30 mL  30 mL Oral Q4H PRN Kerry Hough, PA-C   30 mL at 02/05/17 1607  . chlordiazePOXIDE (LIBRIUM) capsule 25 mg  25 mg Oral BID PRN Georgiann Cocker, MD   25 mg at 02/05/17 1607  . cloNIDine (CATAPRES) tablet 0.1 mg  0.1 mg Oral QAC breakfast Kerry Hough, PA-C   0.1 mg at 02/05/17 0233  . dicyclomine (BENTYL) tablet 20 mg  20 mg Oral Q6H PRN Kerry Hough, PA-C   20 mg at 02/04/17 1713  . feeding supplement (ENSURE ENLIVE) (ENSURE ENLIVE) liquid 237 mL  237 mL Oral BID BM Simon, Spencer E, PA-C   237 mL at  02/02/17 1400  . gabapentin (NEURONTIN) capsule 400 mg  400 mg Oral TID Georgiann Cocker, MD   400 mg at 02/05/17 1158  . hydrOXYzine (ATARAX/VISTARIL) tablet 50 mg  50 mg Oral TID PRN Money, Gerlene Burdock, FNP   50 mg at 02/05/17 0354  . magnesium hydroxide (MILK OF MAGNESIA) suspension 30 mL  30 mL Oral Daily PRN Kerry Hough, PA-C   30 mL at 02/04/17 2125  . methocarbamol (ROBAXIN) tablet 500 mg  500 mg Oral Q8H PRN Kerry Hough, PA-C   500 mg at 02/05/17 0841  . multivitamin with minerals tablet 1 tablet  1 tablet Oral Daily Kerry Hough, PA-C   1 tablet at 02/05/17 4356  . naproxen (NAPROSYN) tablet 500 mg  500 mg Oral BID PRN Kerry Hough, PA-C   500 mg at 02/05/17 1159  . nicotine (NICODERM CQ - dosed in mg/24 hours) patch 21 mg  21 mg Transdermal Daily Donell Sievert E, PA-C   21 mg at 02/05/17 0840  . PARoxetine (PAXIL) tablet 60 mg  60 mg Oral Daily Caliegh Middlekauff, Delight Ovens, MD   60 mg at 02/05/17 0839  . propranolol (INDERAL) tablet 20 mg  20 mg Oral TID Georgiann Cocker, MD   20 mg at 02/05/17 1707  . thiamine (VITAMIN B-1) tablet 100 mg  100 mg Oral Daily Donell Sievert E, PA-C   100 mg at 02/05/17 8616  . traZODone (DESYREL) tablet 50 mg  50 mg Oral QHS,MR X 1 Kerry Hough, PA-C   50 mg at 02/04/17 2240    Lab Results:  No results found for this or any previous visit (from the past 48 hour(s)).  Blood Alcohol level:  Lab Results  Component Value Date   ETH <5 01/31/2017   ETH <5 04/09/2016    Metabolic Disorder Labs: No results found for: HGBA1C, MPG No results found for: PROLACTIN No results found for: CHOL, TRIG, HDL, CHOLHDL, VLDL, LDLCALC  Physical Findings: AIMS: Facial and Oral Movements Muscles of Facial Expression: None, normal Lips and Perioral Area: None, normal Jaw: None, normal Tongue: None, normal,Extremity Movements Upper (arms, wrists, hands, fingers): None, normal Lower (legs, knees, ankles, toes): None, normal, Trunk Movements Neck,  shoulders, hips: None, normal, Overall Severity Severity of abnormal movements (highest score from questions above): None, normal Incapacitation due to abnormal movements: None, normal Patient's awareness of abnormal movements (rate only patient's report): No Awareness, Dental Status Current problems with teeth and/or dentures?: No Does patient usually wear dentures?: No  CIWA:  CIWA-Ar Total: 9 COWS:  COWS Total Score: 5  Musculoskeletal: Strength & Muscle Tone: within normal limits Gait & Station: normal Patient leans: N/A  Psychiatric Specialty Exam: Physical Exam  Constitutional: She is oriented to person, place, and time. She appears well-developed  and well-nourished.  HENT:  Head: Normocephalic and atraumatic.  Neck: Neck supple.  Respiratory: Effort normal.  Neurological: She is alert and oriented to person, place, and time.  Skin: Skin is warm and dry.  Psychiatric:  As above    ROS  Blood pressure 92/60, pulse 71, temperature 98.6 F (37 C), temperature source Oral, resp. rate 20, height 5\' 1"  (1.549 m), weight 81.6 kg (179 lb 15.7 oz), last menstrual period 11/27/2015, SpO2 100 %.Body mass index is 34.01 kg/m.  General Appearance: out a the day room earlier today. More interactive. Better rapport. Appropriate behavior. No evidence of withdrawals.   Eye Contact:  Good  Speech:  Spontaneous. Normal rate and tone  Volume:  Soft spoken  Mood:  Feels better today  Affect:  Restricted but was able to mobilize some positive affect  Thought Process:  Linear  Orientation:  Full (Time, Place, and Person)  Thought Content:  More future oriented. No delusional theme. No preoccupation with violent thoughts.  No hallucination in any modality.   Suicidal Thoughts:  No  Homicidal Thoughts:  No  Memory:  WNL  Judgement:  Better  Insight:  Good  Psychomotor Activity:  Better  Concentration:  Better  Recall:  WNL  Fund of Knowledge:  Good  Language:  Good  Akathisia:  Negative   Handed:    AIMS (if indicated):     Assets:  Desire for Improvement Resilience  ADL's:  Intact  Cognition:  WNL  Sleep:  Number of Hours: 5.5    Assessment and Plan  Mood has lifted since she was informed that [facility] is considering to take her. She is no longer expressing hopelessness. We plan to continue medications at current dose. Hopeful discharge later this week.   Psychiatric: SUD SIMD  Medical: HTN BET  Psychosocial:  Homelessness Limited support Unemployment  PLAN: 1. Continue medications at current dose.   2. Continue to monitor mood, behavior and interaction with peers    Georgiann Cocker, MD 02/05/2017, 5:09 PMPatient ID: Alyssa Kelley, female   DOB: 01-Oct-1970, 46 y.o.   MRN: [id number] Patient ID: Alyssa Kelley, female   DOB: 04-27-1971, 46 y.o.   MRN: [id number] Patient ID: Alyssa Kelley, female   DOB: 1970/08/09, 46 y.o.   MRN: [id number]

## 2017-02-05 NOTE — [facility] Group Notes (Signed)
[facility] LCSW Group Therapy  02/05/2017 3:32 PM  Type of Therapy:  Group Therapy  Participation Level:  Did Not Attend-pt invited. States that she feels bad and wants to remain in bed to rest.   Summary of Progress/Problems: MHA Speaker came to talk about his personal journey with substance abuse and addiction. The pt processed ways by which to relate to the speaker. MHA speaker provided handouts and educational information pertaining to groups and services offered by the [facility].   Cowan Pilar N Smart LCSW 02/05/2017, 3:32 PM

## 2017-02-05 NOTE — Progress Notes (Signed)
Patient ID: Alyssa Kelley, female   DOB: 1971/04/20, 46 y.o.   MRN: [id number]  DAR: Pt. Denies HI and A/V Hallucinations. She reports passive SI, she is able to contract for safety. She reports sleep is poor, appetite is fair, energy level is low, and concentration is poor. She rates depression, hopelessness and anxiety 10/10. Patient does report pain and withdrawal symptoms including tremors, chilling, cramping, craving, nausea, agitation, and irritability. Her affect is sad and sullen throughout most of the day with minimal smiling. Her mood has been depressed today as well. She is isolating to her bed in her room throughout most of the day. She is only seen minimally in the milieu. She reports dizziness and her BP is low before lunch. She is steady on her feet when seen walking by this Clinical research associate. Her scheduled Propranolol was not administered for safety. Support and encouragement provided to the patient. Scheduled and PRN medications administered to patient per physician's orders. Q15 minute checks are maintained for safety.

## 2017-02-05 NOTE — Progress Notes (Signed)
Recreation Therapy Notes  Date: 02/05/17 Time: 0930 Location: 300 Hall Dayroom  Group Topic: Stress Management  Goal Area(s) Addresses:  Patient will verbalize importance of using healthy stress management.  Patient will identify positive emotions associated with healthy stress management.   Intervention: Stress Management  Activity :  Peaceful Waves.  LRT introduced the stress management technique of guided imagery.  LRT played the sounds of waves at the beach from [place] and read a script to allow patients to visualize being at the beach at sunrise.  Patients were to follow along as LRT read script to engage in the activity.    Education:  Stress Management, Discharge Planning.   Education Outcome: Acknowledges edcuation/In group clarification offered/Needs additional education  Clinical Observations/Feedback: Pt did not attend group.   Caroll Rancher, LRT/CTRS         Caroll Rancher A 02/05/2017 11:14 AM

## 2017-02-06 DIAGNOSIS — R45851 Suicidal ideations: Secondary | ICD-10-CM

## 2017-02-06 DIAGNOSIS — K219 Gastro-esophageal reflux disease without esophagitis: Secondary | ICD-10-CM

## 2017-02-06 DIAGNOSIS — G4700 Insomnia, unspecified: Secondary | ICD-10-CM

## 2017-02-06 DIAGNOSIS — F39 Unspecified mood [affective] disorder: Secondary | ICD-10-CM

## 2017-02-06 DIAGNOSIS — F419 Anxiety disorder, unspecified: Secondary | ICD-10-CM

## 2017-02-06 MED ORDER — NAPROXEN 500 MG PO TABS
500.0000 mg | ORAL_TABLET | Freq: Two times a day (BID) | ORAL | Status: DC | PRN
  Administered 2017-02-06 – 2017-02-07 (×3): 500 mg via ORAL
  Filled 2017-02-06 (×3): qty 1

## 2017-02-06 MED ORDER — PANTOPRAZOLE SODIUM 20 MG PO TBEC
20.0000 mg | DELAYED_RELEASE_TABLET | Freq: Every day | ORAL | Status: DC
  Administered 2017-02-06 – 2017-02-07 (×2): 20 mg via ORAL
  Filled 2017-02-06 (×4): qty 1
  Filled 2017-02-06: qty 7

## 2017-02-06 MED ORDER — METHOCARBAMOL 500 MG PO TABS
500.0000 mg | ORAL_TABLET | Freq: Three times a day (TID) | ORAL | Status: DC | PRN
  Administered 2017-02-06 – 2017-02-07 (×2): 500 mg via ORAL
  Filled 2017-02-06 (×2): qty 1

## 2017-02-06 NOTE — Plan of Care (Signed)
Problem: Safety: Goal: Periods of time without injury will increase Outcome: Progressing Patient is on q15 minute safety checks and low fall risk precautions. Patient contracts for safety on the unit and remains safe at this time.   

## 2017-02-06 NOTE — [facility] Group Notes (Signed)
[facility] LCSW Group Therapy  02/06/2017 3:49 PM  Type of Therapy:  Group Therapy  Participation Level:  Did Not Attend-pt invited. Chose to remain in bed.   Summary of Progress/Problems: MHA Speaker came to talk about his personal journey with substance abuse and addiction. The pt processed ways by which to relate to the speaker. MHA speaker provided handouts and educational information pertaining to groups and services offered by the [facility].   Melvia Matousek N Smart LCSW 02/06/2017, 3:49 PM

## 2017-02-06 NOTE — Progress Notes (Signed)
Patient ID: Alyssa Kelley, female   DOB: 1971-03-10, 46 y.o.   MRN: [id number] Pt's B/P was 82/62 (standing) HR-76, and 98/67, HR-78 (sitting), at 1700.  Propanolol 20mg  held due to low B/P, pt complained of generalized body aches and cramps of 8/10, was medicated with Robaxin 500mg  and Naproxen 500mg .  Patient also asked for "something for anxiety", and was medicated with Atarax 50mg .  Q15 minute checks in place, will continue to monitor.

## 2017-02-06 NOTE — Progress Notes (Signed)
Patient ID: KRUPA DIANTONIO, female   DOB: March 18, 1971, 46 y.o.   MRN: [id number]  Patient reports that she had "fair" amount of sleep last night, states that her appetite is "fair", reports tremors, cravings, chills, cramps, and irritability, medicated with Librium 25mg  for a CIWA of 10. Pt took all other scheduled meds as ordered, states that her goal for today is to find a rehab, and that she intends to place some phone calls to find one.  Patient reported +SI earlier this morning, but has now denied SI/HI/AVH.  Pt reports that she ate 50% of her breakfast this morning, denied having pain during med administration, Q15 minute checks in place, will continue to monitor.

## 2017-02-06 NOTE — Progress Notes (Signed)
Nursing Progress Note 1900-0730  D) Patient presents with depressed and sad mood and mild anxiety. Patient appears anxious, has mild tremors and reports withdrawal symptoms. Blood pressure monitored and patient encouraged to drink fluids. Patient reports passive SI but denies HI/AVH or pain. Patient contracts for safety on the unit. Patient medicated with PRN orders as prescribed.  A) Emotional support given. 1:1 interaction and active listening provided. Patient medicated as prescribed. Medications and plan of care reviewed with patient. Patient verbalized understanding without further questions. Snacks and fluids provided. Opportunities for questions or concerns presented to patient. Patient encouraged to continue to work on treatment goals. Labs, vital signs and patient behavior monitored throughout shift. Patient safety maintained with q15 min safety checks. Low fall risk precautions in place and reviewed with patient; patient verbalized understanding.  R) Patient receptive to interaction with nurse. Patient remains safe on the unit at this time. Patient denies any adverse medication reactions at this time. Patient is resting in bed without complaints. Will continue to monitor.

## 2017-02-06 NOTE — Progress Notes (Signed)
[facility] MD Progress Note  02/06/2017 1:01 PM Alyssa Kelley  MRN:  [id number]   Subjective:  Patient reports that she is still depressed and admits to some passive SI. Denies HI and VH. Reports AH but states they have only been there a few weeks and feels it may be from her drug abuse. She reports that she is planning on going to the [facility] program and that she is going to call shelters today for a bed. She also asks for Nexium for heartburn, something for  Pain, and her Prazosin for her nightmares.   Objective: Patient is pleasant and cooperative. She is aware that if she is still suicidal then [facility] will not accept her and she states agreement and understanding. She is started on Protonix for her GERD, Prazosin is held due to Propranolol and her low BP, and she is restarted on Naproxen and Robaxin. She has appropriate and pleasant affect, even while stating SI and she is laughing and joking with this Clinical research associate. Feel patient is seeking secondary gain due to having to go to a shelter before getting accepted to [facility]. I informed patient that she will be discharged in the next 1-2 days and she stated understanding and has been seen contacting shelters on the phone today.  Principal Problem: Substance induced mood disorder (HCC) Diagnosis:   Patient Active Problem List   Diagnosis Date Noted  . Opioid use disorder, severe, dependence (HCC) [F11.20] 04/11/2016  . Substance induced mood disorder (HCC) [F19.94] 04/10/2016  . UTI (urinary tract infection) [N39.0] 12/07/2014  . Alcohol dependence with alcohol-induced mood disorder (HCC) [F10.24]   . Major depressive disorder, recurrent episode, severe (HCC) [F33.2] 12/04/2014  . Opioid use disorder, moderate, dependence (HCC) [F11.20] 12/04/2014  . Alcohol use disorder, severe, dependence (HCC) [F10.20] 11/12/2014  . Alcohol abuse [F10.10] 09/10/2013  . Cocaine abuse [F14.10] 09/10/2013   Total Time spent with patient: 25 minutes  Past Psychiatric History: See  H&P  Past Medical History:  Past Medical History:  Diagnosis Date  . Abscess of mouth   . Alcohol abuse   . Anxiety   . Benzodiazepine abuse   . Cocaine abuse   . Depression   . Hepatitis C   . Heroin abuse   . Hypertension   . 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 CESAREAN SECTION     Family History: History reviewed. No pertinent family history. Family Psychiatric  History: See H&P Social History:  History  Alcohol Use  . 50.4 oz/week  . 84 Cans of beer per week    Comment: 1 12-pack daiy     History  Drug Use  . Types: Cocaine, Oxycodone, Heroin, Benzodiazepines    Comment: Heroin, benzos    Social History   Social History  . Marital status: Single    Spouse name: N/A  . Number of children: N/A  . Years of education: N/A   Social History Main Topics  . Smoking status: Current Every Day Smoker    Packs/day: 1.00    Years: 15.00    Types: Cigarettes  . Smokeless tobacco: Never Used  . Alcohol use 50.4 oz/week    84 Cans of beer per week     Comment: 1 12-pack daiy  . Drug use: Yes    Types: Cocaine, Oxycodone, Heroin, Benzodiazepines     Comment: Heroin, benzos  . Sexual activity: Yes    Birth control/ protection: None   Other Topics  Concern  . None   Social History Narrative  . None   Additional Social History:                         Sleep: Good  Appetite:  Good  Current Medications: Current Facility-Administered Medications  Medication Dose Route Frequency Provider Last Rate Last Dose  . alum & mag hydroxide-simeth (MAALOX/MYLANTA) 200-200-20 MG/5ML suspension 30 mL  30 mL Oral Q4H PRN Donell Sievert E, PA-C   30 mL at 02/06/17 0651  . chlordiazePOXIDE (LIBRIUM) capsule 25 mg  25 mg Oral BID PRN Georgiann Cocker, MD   25 mg at 02/06/17 0856  . feeding supplement (ENSURE ENLIVE) (ENSURE ENLIVE) liquid 237 mL  237 mL Oral BID BM Simon, Spencer E, PA-C   237 mL at 02/02/17 1400  .  gabapentin (NEURONTIN) capsule 400 mg  400 mg Oral TID Georgiann Cocker, MD   400 mg at 02/06/17 1142  . hydrOXYzine (ATARAX/VISTARIL) tablet 50 mg  50 mg Oral TID PRN Fidencio Duddy, Gerlene Burdock, FNP   50 mg at 02/05/17 2127  . magnesium hydroxide (MILK OF MAGNESIA) suspension 30 mL  30 mL Oral Daily PRN Kerry Hough, PA-C   30 mL at 02/04/17 2125  . methocarbamol (ROBAXIN) tablet 500 mg  500 mg Oral Q8H PRN Wyllow Seigler, Gerlene Burdock, FNP      . multivitamin with minerals tablet 1 tablet  1 tablet Oral Daily Kerry Hough, PA-C   1 tablet at 02/06/17 0854  . naproxen (NAPROSYN) tablet 500 mg  500 mg Oral BID PRN Owen Pagnotta, Gerlene Burdock, FNP      . nicotine (NICODERM CQ - dosed in mg/24 hours) patch 21 mg  21 mg Transdermal Daily Donell Sievert E, PA-C   21 mg at 02/06/17 0900  . pantoprazole (PROTONIX) EC tablet 20 mg  20 mg Oral Daily Treyton Slimp, Gerlene Burdock, FNP   20 mg at 02/06/17 1142  . PARoxetine (PAXIL) tablet 60 mg  60 mg Oral Daily Izediuno, Delight Ovens, MD   60 mg at 02/06/17 0854  . propranolol (INDERAL) tablet 20 mg  20 mg Oral TID Georgiann Cocker, MD   20 mg at 02/06/17 1148  . thiamine (VITAMIN B-1) tablet 100 mg  100 mg Oral Daily Donell Sievert E, PA-C   100 mg at 02/06/17 0854  . traZODone (DESYREL) tablet 50 mg  50 mg Oral QHS,MR X 1 Kerry Hough, PA-C   50 mg at 02/05/17 2227    Lab Results: No results found for this or any previous visit (from the past 48 hour(s)).  Blood Alcohol level:  Lab Results  Component Value Date   ETH <5 01/31/2017   ETH <5 04/09/2016    Metabolic Disorder Labs: No results found for: HGBA1C, MPG No results found for: PROLACTIN No results found for: CHOL, TRIG, HDL, CHOLHDL, VLDL, LDLCALC  Physical Findings: AIMS: Facial and Oral Movements Muscles of Facial Expression: None, normal Lips and Perioral Area: None, normal Jaw: None, normal Tongue: None, normal,Extremity Movements Upper (arms, wrists, hands, fingers): None, normal Lower (legs, knees, ankles,  toes): None, normal, Trunk Movements Neck, shoulders, hips: None, normal, Overall Severity Severity of abnormal movements (highest score from questions above): None, normal Incapacitation due to abnormal movements: None, normal Patient's awareness of abnormal movements (rate only patient's report): No Awareness, Dental Status Current problems with teeth and/or dentures?: No Does patient usually wear dentures?: No  CIWA:  CIWA-Ar Total:  10 COWS:  COWS Total Score: 7  Musculoskeletal: Strength & Muscle Tone: within normal limits Gait & Station: normal Patient leans: N/A  Psychiatric Specialty Exam: Physical Exam  Nursing note and vitals reviewed. Constitutional: She is oriented to person, place, and time. She appears well-developed and well-nourished.  Cardiovascular: Normal rate.   Respiratory: Effort normal.  Musculoskeletal: Normal range of motion.  Neurological: She is alert and oriented to person, place, and time.  Skin: Skin is warm.    Review of Systems  Constitutional: Negative.   HENT: Negative.   Eyes: Negative.   Respiratory: Negative.   Cardiovascular: Negative.   Gastrointestinal: Positive for heartburn.  Genitourinary: Negative.   Musculoskeletal: Negative.   Skin: Negative.   Neurological: Negative.   Endo/Heme/Allergies: Negative.     Blood pressure 102/62, pulse 79, temperature 99.9 F (37.7 C), temperature source Oral, resp. rate 18, height 5\' 1"  (1.549 m), weight 81.6 kg (179 lb 15.7 oz), last menstrual period 11/27/2015, SpO2 100 %.Body mass index is 34.01 kg/m.  General Appearance: Casual and Disheveled  Eye Contact:  Good  Speech:  Clear and Coherent and Normal Rate  Volume:  Normal  Mood:  Reports depressed but presents pleasant  Affect:  Appropriate  Thought Process:  Coherent and Descriptions of Associations: Intact  Orientation:  Full (Time, Place, and Person)  Thought Content:  WDL  Suicidal Thoughts:  Yes.  without intent/plan  Homicidal  Thoughts:  No  Memory:  Immediate;   Good Recent;   Good  Judgement:  Fair  Insight:  Good  Psychomotor Activity:  Normal  Concentration:  Concentration: Good  Recall:  Good  Fund of Knowledge:  Good  Language:  Good  Akathisia:  No  Handed:  Right  AIMS (if indicated):     Assets:  Financial Resources/Insurance Social Support  ADL's:  Intact  Cognition:  WNL  Sleep:  Number of Hours: 6.75     Treatment Plan Summary: Daily contact with patient to assess and evaluate symptoms and progress in treatment, Medication management and Plan is to:  -Continue Paxil 60 mg PO Daily for mood stability -Continue Librium protocol -Continue Propranolol 20 mg PO TID  -Encourage group therapy participation -Continue Hydroxyzine 50 mg PO TID PRN for anxiety -Continue Gabapentin 400 mg PO TID  -Continue Trazodone 50 mg PO QHS PRN for insomnia  Gerlene Burdock Jerimie Mancuso, FNP 02/06/2017, 1:01 PM

## 2017-02-06 NOTE — Progress Notes (Signed)
Recreation Therapy Notes  Animal-Assisted Activity (AAA) Program Checklist/Progress Notes Patient Eligibility Criteria Checklist & Daily Group note for Rec TxIntervention  Date: 08.21.2018 Time: 2:45pm Location: [address]   AAA/T Program Assumption of Risk Form signed by Patient/ or Parent Legal Guardian Yes  Patient is free of allergies or sever asthma Yes  Patient reports no fear of animals Yes  Patient reports no history of cruelty to animals Yes  Patient understands his/her participation is voluntary Yes  Patient washes hands before animal contact Yes  Patient washes hands after animal contact Yes  Behavioral Response: Appropriate   Education:Hand Washing, Appropriate Animal Interaction   Education Outcome: Acknowledges education.   Clinical Observations/Feedback: Patient attended session and interacted appropriately with therapy dog and peers.    Marykay Lex Terrel Manalo, LRT/CTRS       Emerson Schreifels L 02/06/2017 3:12 PM

## 2017-02-06 NOTE — Progress Notes (Signed)
Pt states that [facility] called [facility]--they have beds available on Wednesday. Pt referral pending at [facility]. She is not eligible to return to [facility] total access care due to being "unsuccessfully discharged" from grant program. Referral made to [facility], as pt expressed interest in Methadone or suboxone maintenance.   Trula Slade, MSW, LCSW Clinical Social Worker 02/06/2017 4:09 PM

## 2017-02-07 MED ORDER — TRAZODONE HCL 50 MG PO TABS: 50.0000 mg | ORAL_TABLET | Freq: Every evening | ORAL | 0 refills | Status: AC | PRN

## 2017-02-07 MED ORDER — PRAZOSIN HCL 1 MG PO CAPS
1.0000 mg | ORAL_CAPSULE | Freq: Every day | ORAL | Status: DC
  Filled 2017-02-07: qty 7
  Filled 2017-02-07: qty 1

## 2017-02-07 MED ORDER — GABAPENTIN 400 MG PO CAPS: 400.0000 mg | ORAL_CAPSULE | Freq: Three times a day (TID) | ORAL | 0 refills | Status: AC

## 2017-02-07 MED ORDER — TRAZODONE HCL 50 MG PO TABS
50.0000 mg | ORAL_TABLET | Freq: Every evening | ORAL | Status: DC | PRN
  Filled 2017-02-07: qty 7

## 2017-02-07 MED ORDER — PROPRANOLOL HCL 20 MG PO TABS: 20.0000 mg | ORAL_TABLET | Freq: Three times a day (TID) | ORAL | 0 refills | Status: AC

## 2017-02-07 MED ORDER — PRAZOSIN HCL 1 MG PO CAPS: 1.0000 mg | ORAL_CAPSULE | Freq: Every day | ORAL | 0 refills | Status: AC

## 2017-02-07 MED ORDER — PAROXETINE HCL 30 MG PO TABS: 60.0000 mg | ORAL_TABLET | Freq: Every day | ORAL | 0 refills | Status: AC

## 2017-02-07 MED ORDER — PANTOPRAZOLE SODIUM 20 MG PO TBEC: 20.0000 mg | DELAYED_RELEASE_TABLET | Freq: Every day | ORAL | 0 refills | Status: AC

## 2017-02-07 MED ORDER — HYDROXYZINE HCL 50 MG PO TABS: 50.0000 mg | ORAL_TABLET | Freq: Three times a day (TID) | ORAL | 0 refills | Status: AC | PRN

## 2017-02-07 NOTE — Progress Notes (Signed)
Adult Psychoeducational Group Note  Date:  02/07/2017 Time:  12:08 AM  Group Topic/Focus:  Wrap-Up Group:   The focus of this group is to help patients review their daily goal of treatment and discuss progress on daily workbooks.  Participation Level:  Did Not Attend  Additional Comments:  Pt did not attend group.   Reade Trefz M 02/07/2017, 12:08 AM 

## 2017-02-07 NOTE — Progress Notes (Signed)
  [facility] Adult Case Management Discharge Plan :  Will you be returning to the same living situation after discharge:  No. Pt discharging to [facility]. At discharge, do you have transportation home?: Yes,  bus passes provided. Do you have the ability to pay for your medications: Yes,  prescriptions and samples provided.  Release of information consent forms completed and in the chart;  Patient's signature needed at discharge.  Patient to Follow up at: Follow-up Information    [facility] Follow up.   Why:  Referral faxed: 02/02/17. Per Marylene Land, your application has been denied. If you are still interested in residential treatment please present as a walk-in to [facility]. Contact information: [address] [place], [place] [address] Phone: [phone] Fax: [phone]       [facility] Follow up.   Specialty:  Behavioral Health Why:  Please follow-up within 7 days of discharge for outpatient mental health services and assessment for suboxone/methadone if interested. Walk in hours: Monday, Wednesday, Fridays from 12:30PM-3:00PM. (you are not eligible to return to [facility]). Contact information: [address] [place] [address] [phone]        [facility] Follow up.   Why:  If you are still interested in residential substance use treatment please present to [facility] as a walk-in for a screening assessment.  Contact information: [address] [place] [address] [phone]           Next level of care provider has access to [facility]:no  Safety Planning and Suicide Prevention discussed: Yes,  with pt.  Have you used any form of tobacco in the last 30 days? (Cigarettes, Smokeless Tobacco, Cigars, and/or Pipes): Yes  Has patient been referred to the Quitline?: Patient refused referral  Patient has been referred for addiction treatment: Yes  Jonathon Jordan, MSW,  LCSWA 02/07/2017, 10:22 AM

## 2017-02-07 NOTE — [facility] Suicide Risk Assessment (Signed)
[facility] Discharge Suicide Risk Assessment   Principal Problem: Substance induced mood disorder [facility]) Discharge Diagnoses:  Patient Active Problem List   Diagnosis Date Noted  . Opioid use disorder, severe, dependence (HCC) [F11.20] 04/11/2016  . Substance induced mood disorder (HCC) [F19.94] 04/10/2016  . UTI (urinary tract infection) [N39.0] 12/07/2014  . Alcohol dependence with alcohol-induced mood disorder (HCC) [F10.24]   . Major depressive disorder, recurrent episode, severe (HCC) [F33.2] 12/04/2014  . Opioid use disorder, moderate, dependence (HCC) [F11.20] 12/04/2014  . Alcohol use disorder, severe, dependence (HCC) [F10.20] 11/12/2014  . Alcohol abuse [F10.10] 09/10/2013  . Cocaine abuse [F14.10] 09/10/2013   Patient is a 46 year old female transferred from [facility] ED for suicidal ideation with a plan to overdose on medications as patient reported that her depression had worsened over the past 2 weeks. She also reported that she was addicted to multiple substances and could not stop. Patient added that she had multiple attempts at rehabilitation but continues to relapse. Patient reports that her primary stressors as being homeless and having addiction  Patient states that she is doing better in regards to her mood, has been clean now for a few days and feels that she can go to a shelter, follow-up [facility]. She has that she wants to get on Suboxone and plans to go to [facility] and follow up with them. She states she wants to get her life back on track and knows that the only way she can do that if she stays clean.. Patient denies any suicidal thoughts, any homicidal thoughts, any paranoia, hallucinations or delusions. She also reports that she's eating fine and sleeping well and denies any side effects of her medications Total Time spent with patient: 30 minutes  Musculoskeletal: Strength & Muscle Tone: within normal limits Gait & Station: normal Patient leans: N/A  Psychiatric Specialty  Exam: Review of Systems  Constitutional: Negative.  Negative for fever and malaise/fatigue.  HENT: Negative.  Negative for congestion and sore throat.   Eyes: Negative.  Negative for blurred vision, double vision, discharge and redness.  Respiratory: Negative.  Negative for cough, shortness of breath and wheezing.   Cardiovascular: Negative.  Negative for chest pain and palpitations.  Gastrointestinal: Negative for abdominal pain, diarrhea, heartburn, nausea and vomiting.  Musculoskeletal: Negative.  Negative for falls and myalgias.  Neurological: Negative.  Negative for dizziness, seizures, loss of consciousness, weakness and headaches.  Endo/Heme/Allergies: Negative.  Negative for environmental allergies.  Psychiatric/Behavioral: Negative.  Negative for depression, hallucinations, memory loss, substance abuse and suicidal ideas. The patient is not nervous/anxious and does not have insomnia.     Blood pressure 96/76, pulse 90, temperature 98.2 F (36.8 C), temperature source Oral, resp. rate 18, height 5\' 1"  (1.549 m), weight 81.6 kg (179 lb 15.7 oz), last menstrual period 11/27/2015, SpO2 100 %.Body mass index is 34.01 kg/m.  General Appearance: Casual  Eye Contact::  Fair  Speech:  Clear and Coherent and Normal Rate409  Volume:  Normal  Mood:  Euthymic  Affect:  Congruent and Full Range  Thought Process:  Coherent, Goal Directed and Descriptions of Associations: Intact  Orientation:  Full (Time, Place, and Person)  Thought Content:  WDL  Suicidal Thoughts:  No  Homicidal Thoughts:  No  Memory:  Immediate;   Fair Recent;   Fair Remote;   Fair  Judgement:  Intact  Insight:  Present  Psychomotor Activity:  Normal  Concentration:  Fair  Recall:  [employer] of Knowledge:Fair  Language: Fair  Akathisia:  No  Handed:  Right  AIMS (if indicated):     Assets:  Communication Skills Desire for Improvement  Sleep:  Number of Hours: 6.5  Cognition: WNL  ADL's:  Intact   Mental  Status Per Nursing Assessment::   On Admission:     Demographic Factors:  Caucasian and Unemployed  Loss Factors: Financial problems/change in socioeconomic status  Historical Factors: Family history of mental illness or substance abuse and Impulsivity  Risk Reduction Factors:   NA  Continued Clinical Symptoms:  Alcohol/Substance Abuse/Dependencies More than one psychiatric diagnosis Previous Psychiatric Diagnoses and Treatments  Cognitive Features That Contribute To Risk:  None    Suicide Risk:  Minimal: No identifiable suicidal ideation.  Patients presenting with no risk factors but with morbid ruminations; may be classified as minimal risk based on the severity of the depressive symptoms  Follow-up Information    [facility] Follow up.   Why:  Referral faxed: 02/02/17. Per Marylene Land, your application has been denied. If you are still interested in residential treatment please present as a walk-in to [facility]. Contact information: [address] [place], [place] [address] Phone: [phone] Fax: [phone]       [facility] Follow up.   Specialty:  Behavioral Health Why:  Please follow-up within 7 days of discharge for outpatient mental health services and assessment for suboxone/methadone if interested. Walk in hours: Monday, Wednesday, Fridays from 12:30PM-3:00PM. (you are not eligible to return to [facility]). Contact information: [address] [place] [address] [phone]        [facility] Follow up.   Why:  If you are still interested in residential substance use treatment please present to [facility] as a walk-in for a screening assessment.  Contact information: [address] [place] [address] [phone]           Plan Of Care/Follow-up recommendations:  Activity:  As tolerated Diet:  Heart healthy diet Other:  Keep follow-up appointments and take  medications as prescribed  Nelly Rout, MD 02/07/2017, 12:52 PM

## 2017-02-07 NOTE — Progress Notes (Signed)
Nursing Progress Note [id number]  D) Patient presents anxious, depressed and with several somatic complaints. Patient very anxious about medications and reports, "I take clonidine everyday, and Inderal for my tremors. I have never had low blood pressure before". Patient medicated with PRNs as prescribed. Patient encouraged to drink PO fluids and speak to provider about medication changes. Patient is isolative to room this evening. Patient reports SI but denies HI/AVH or pain. Patient contracts for safety on the unit. Patient reports sleeping well with current regimen.  A) Emotional support given. 1:1 interaction and active listening provided. Patient medicated as prescribed. Medications and plan of care reviewed with patient. Patient verbalized understanding without further questions. Snacks and fluids provided. Opportunities for questions or concerns presented to patient. Patient encouraged to continue to work on treatment goals. Labs, vital signs and patient behavior monitored throughout shift. Patient safety maintained with q15 min safety checks. Low fall risk precautions in place and reviewed with patient; patient verbalized understanding.  R) Patient receptive to interaction with nurse. Patient remains safe on the unit at this time. Patient denies any adverse medication reactions at this time. Patient is resting in bed without complaints. Will continue to monitor.

## 2017-02-07 NOTE — Progress Notes (Addendum)
Pt has a bed at [facility]. Pt will need to discharge by 1pm in order to make her 4:30pm appointment at [facility]. CSW will provide pt with a homelessness verification letter.  Jonathon Jordan, MSW, Theresia Majors [phone]

## 2017-02-07 NOTE — Progress Notes (Signed)
Patient ID: Alyssa Kelley, female   DOB: 04/01/1971, 46 y.o.   MRN: [id number] NSG D/C Note:Pt denies si/hi at this time. States that she will comply with outpt services and take her meds as prescribed.D/C to lobby for transport to [facility].

## 2017-02-07 NOTE — Plan of Care (Signed)
Problem: Safety: Goal: Periods of time without injury will increase Outcome: Progressing Patient is on q15 minute safety checks and low fall risk precautions. Patient contracts for safety on the unit and remains safe at this time.   

## 2017-02-07 NOTE — Discharge Summary (Signed)
Physician Discharge Summary Note  Patient:  Alyssa Kelley is an 46 y.o., female MRN:  [id number] DOB:  06/03/1971 Patient phone:  [phone] (home)  Patient address:   [address] [place] [address],  Total Time spent with patient: 30 minutes  Date of Admission:  02/01/2017 Date of Discharge: 02/07/17  Reason for Admission:  Polysubstance abuse with worsening depression and SI  Principal Problem: Substance induced mood disorder [facility]) Discharge Diagnoses: Patient Active Problem List   Diagnosis Date Noted  . Opioid use disorder, severe, dependence (HCC) [F11.20] 04/11/2016  . Substance induced mood disorder (HCC) [F19.94] 04/10/2016  . UTI (urinary tract infection) [N39.0] 12/07/2014  . Alcohol dependence with alcohol-induced mood disorder (HCC) [F10.24]   . Major depressive disorder, recurrent episode, severe (HCC) [F33.2] 12/04/2014  . Opioid use disorder, moderate, dependence (HCC) [F11.20] 12/04/2014  . Alcohol use disorder, severe, dependence (HCC) [F10.20] 11/12/2014  . Alcohol abuse [F10.10] 09/10/2013  . Cocaine abuse [F14.10] 09/10/2013    Past Psychiatric History: This is her third admission here. She has been admitted under similar circumstances in the past. She has been in rehab on multiple occassions. Says her longest sobriety was for a year. She was in prison then. No involvement with AA or NA. Denies any past suicidal behavior. No past history of violent behavior.  She is followed in the community by an outpatient addiction center. She is on Paxil 40 mg daily, Gabapentin 300 mg TID, Prazosin, Clonidine, Inderal and Hydroxyzine  Past Medical History:  Past Medical History:  Diagnosis Date  . Abscess of mouth   . Alcohol abuse   . Anxiety   . Benzodiazepine abuse   . Cocaine abuse   . Depression   . Hepatitis C   . Heroin abuse   . Hypertension   . 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 CESAREAN SECTION      Family History: History reviewed. No pertinent family history. Family Psychiatric  History: Family h/o addiction Social History:  History  Alcohol Use  . 50.4 oz/week  . 84 Cans of beer per week    Comment: 1 12-pack daiy     History  Drug Use  . Types: Cocaine, Oxycodone, Heroin, Benzodiazepines    Comment: Heroin, benzos    Social History   Social History  . Marital status: Single    Spouse name: N/A  . Number of children: N/A  . Years of education: N/A   Social History Main Topics  . Smoking status: Current Every Day Smoker    Packs/day: 1.00    Years: 15.00    Types: Cigarettes  . Smokeless tobacco: Never Used  . Alcohol use 50.4 oz/week    84 Cans of beer per week     Comment: 1 12-pack daiy  . Drug use: Yes    Types: Cocaine, Oxycodone, Heroin, Benzodiazepines     Comment: Heroin, benzos  . Sexual activity: Yes    Birth control/ protection: None   Other Topics Concern  . None   Social History Narrative  . None    Hospital Course:  46 yo Caucasian female, single, unemployed, homeless. Background history of SUD. Self presented seeking help. Has been using cocaine, alcohol, opioids and benzodiazepines regularly. Has been feeling irritable, has had suicidal thoughts off and on. Wants a safe place to come off substances. UDS was positive for cocaine. BAL <5, AST was mildly elevated. Other parameters are essentially normal.  At  interview, patient tells me that she has been using substances since her early teens. Says she has been homeless for the past month. She does not have any income. She is supported by her boyfriend. Says she is tired of this and wants to get clean. Her hope is to detox and get into the [facility] programme in [place]. Wants to remain on Suboxone which they prescribe there. Patient says she is not really suicidal. Was desperate to get help. Reports mood swings and negative thoughts coming off substances. Sometimes feels her life is not worth living  this way. Has had periods of hallucinations while under the influence of substances. No hallucinations lately. Does not feel persecuted. Not expressing any delusions. No thoughts of violence. No homicidal thoughts. No evidence of PTSD. No access to weapons. Major stressor is being homeless. No legal issues. No relational issues. She has limited support as her substance use has affected relationship with her children and family members.  Seen today, still coming off substances. Says she still has some irritability and cravings. She is coming out a bit more. Ate well this afternoon. No nausea or vomiting. Still thinks about the negative things that has happened in her life. No positive thoughts lately. No suicidal thoughts. Wants to get better Seen today, pleased to learn that she is likely getting into [facility]. Says she is is okay with Methadone rather than  Suboxone. Hopelessness and worthlessness is less. No suicidal thoughts. No craving for substances. No evidence of psychosis. No evidence of mania. Patient is pleasant and cooperative. She is aware that if she is still suicidal then [facility] will not accept her and she states agreement and understanding. She is started on Protonix for her GERD, Prazosin is held due to Propranolol and her low BP, and she is restarted on Naproxen and Robaxin. She has appropriate and pleasant affect, even while stating SI and she is laughing and joking with this Clinical research associate. Feel patient is seeking secondary gain due to having to go to a shelter before getting accepted to [facility]. I informed patient that she will be discharged in the next 1-2 days and she stated understanding and has been seen contacting shelters on the phone today. Patient is informed today that she was denied at [facility]. She is accepted at [facility] and has a bed. Upon discharge she states that she will refuse to discharge if she does not get her Prazosin and Clonidine. She is educated multiple times about her low BP and the  risks of the added medications and that she stabilized without them. Patient was given low dose of Prazosin prescription along with prescriptions of her other medications while admitted to the unit. Patient was agitated and cursing about her Clonidine, but calmed down and was ready for discharge for her ride to [employer] on time. Patient presented more with BPD behavior today than any other diagnosis.  Physical Findings: AIMS: Facial and Oral Movements Muscles of Facial Expression: None, normal Lips and Perioral Area: None, normal Jaw: None, normal Tongue: None, normal,Extremity Movements Upper (arms, wrists, hands, fingers): None, normal Lower (legs, knees, ankles, toes): None, normal, Trunk Movements Neck, shoulders, hips: None, normal, Overall Severity Severity of abnormal movements (highest score from questions above): None, normal Incapacitation due to abnormal movements: None, normal Patient's awareness of abnormal movements (rate only patient's report): No Awareness, Dental Status Current problems with teeth and/or dentures?: No Does patient usually wear dentures?: No  CIWA:  CIWA-Ar Total: 6 COWS:  COWS Total Score:  2  Musculoskeletal: Strength & Muscle Tone: within normal limits Gait & Station: normal Patient leans: N/A  Psychiatric Specialty Exam: Physical Exam  Nursing note and vitals reviewed. Constitutional: She is oriented to person, place, and time. She appears well-developed and well-nourished.  Cardiovascular: Normal rate.   Respiratory: Effort normal.  Musculoskeletal: Normal range of motion.  Neurological: She is alert and oriented to person, place, and time.  Skin: Skin is warm.    Review of Systems  Constitutional: Negative.   HENT: Negative.   Eyes: Negative.   Respiratory: Negative.   Cardiovascular: Negative.   Gastrointestinal: Negative.   Genitourinary: Negative.   Musculoskeletal: Negative.   Skin: Negative.   Neurological: Negative.    Endo/Heme/Allergies: Negative.     Blood pressure 96/76, pulse 90, temperature 98.2 F (36.8 C), temperature source Oral, resp. rate 18, height 5\' 1"  (1.549 m), weight 81.6 kg (179 lb 15.7 oz), last menstrual period 11/27/2015, SpO2 100 %.Body mass index is 34.01 kg/m.  General Appearance: Casual  Eye Contact:  Good  Speech:  Clear and Coherent and Normal Rate  Volume:  Increased and cursing when demanding medications  Mood:  Irritable and reports depression but seen euthymic in day room with other patients  Affect:  Flat and appropriate when interacting with peers  Thought Process:  Coherent and Descriptions of Associations: Intact  Orientation:  Full (Time, Place, and Person)  Thought Content:  WDL  Suicidal Thoughts:  States SI when told of discharge but then is ready to go to her new housing at [employer]  Homicidal Thoughts:  No  Memory:  Immediate;   Good Recent;   Good  Judgement:  Good  Insight:  Good  Psychomotor Activity:  Normal  Concentration:  Concentration: Good  Recall:  Good  Fund of Knowledge:  Good  Language:  Good  Akathisia:  No  Handed:  Right  AIMS (if indicated):     Assets:  Financial Resources/Insurance Housing Social Support  ADL's:  Intact  Cognition:  WNL  Sleep:  Number of Hours: 6.5     Have you used any form of tobacco in the last 30 days? (Cigarettes, Smokeless Tobacco, Cigars, and/or Pipes): Yes  Has this patient used any form of tobacco in the last 30 days? (Cigarettes, Smokeless Tobacco, Cigars, and/or Pipes) Yes, No  Blood Alcohol level:  Lab Results  Component Value Date   ETH <5 01/31/2017   ETH <5 04/09/2016    Metabolic Disorder Labs:  No results found for: HGBA1C, MPG No results found for: PROLACTIN No results found for: CHOL, TRIG, HDL, CHOLHDL, VLDL, LDLCALC  See Psychiatric Specialty Exam and Suicide Risk Assessment completed by Attending Physician prior to discharge.  Discharge destination:  Other:  [facility]  Is patient on multiple antipsychotic therapies at discharge:  No   Has Patient had three or more failed trials of antipsychotic monotherapy by history:  No  Recommended Plan for Multiple Antipsychotic Therapies: NA   Allergies as of 02/07/2017   No Known Allergies     Medication List    STOP taking these medications   cloNIDine 0.1 MG tablet Commonly known as:  CATAPRES   nicotine 21 mg/24hr patch Commonly known as:  NICODERM CQ - dosed in mg/24 hours     TAKE these medications     Indication  gabapentin 400 MG capsule Commonly known as:  NEURONTIN Take 1 capsule (400 mg total) by mouth 3 (three) times daily. What changed:  medication strength  how much to take  Indication:  Mood stability   hydrOXYzine 50 MG tablet Commonly known as:  ATARAX/VISTARIL Take 1 tablet (50 mg total) by mouth 3 (three) times daily as needed (anxiety/agitation or CIWA < or = 10). What changed:  when to take this  reasons to take this  Indication:  Feeling Anxious   pantoprazole 20 MG tablet Commonly known as:  PROTONIX Take 1 tablet (20 mg total) by mouth daily.  Indication:  Gastroesophageal Reflux Disease   PARoxetine 30 MG tablet Commonly known as:  PAXIL Take 2 tablets (60 mg total) by mouth daily. What changed:  medication strength  how much to take  when to take this  Indication:  Mood stability   prazosin 1 MG capsule Commonly known as:  MINIPRESS Take 1 capsule (1 mg total) by mouth at bedtime.  Indication:  PTSD   propranolol 20 MG tablet Commonly known as:  INDERAL Take 1 tablet (20 mg total) by mouth 3 (three) times daily.  Indication:  Mood stability   traZODone 50 MG tablet Commonly known as:  DESYREL Take 1 tablet (50 mg total) by mouth at bedtime as needed for sleep.  Indication:  Trouble Sleeping      Follow-up Information    [facility] Follow up.   Why:  Referral faxed: 02/02/17. Per Marylene Land, your  application has been denied. If you are still interested in residential treatment please present as a walk-in to [facility]. Contact information: [address] [place], [place] [address] Phone: [phone] Fax: [phone]       [facility] Follow up.   Specialty:  Behavioral Health Why:  Please follow-up within 7 days of discharge for outpatient mental health services and assessment for suboxone/methadone if interested. Walk in hours: Monday, Wednesday, Fridays from 12:30PM-3:00PM. (you are not eligible to return to [facility]). Contact information: [address] [place] [address] [phone]        [facility], [facility] Follow up.   Why:  If you are still interested in residential substance use treatment please present to [facility] as a walk-in for a screening assessment.  Contact information: [address] [place] [address] [phone]           Follow-up recommendations:  Continue activity as tolerated. Continue diet as recommended by your PCP. Ensure to keep all appointments with outpatient providers.  Comments:  Patient is instructed prior to discharge to: Take all medications as prescribed by his/her mental healthcare provider. Report any adverse effects and or reactions from the medicines to his/her outpatient provider promptly. Patient has been instructed & cautioned: To not engage in alcohol and or illegal drug use while on prescription medicines. In the event of worsening symptoms, patient is instructed to call the crisis hotline, 911 and or go to the nearest ED for appropriate evaluation and treatment of symptoms. To follow-up with his/her primary care provider for your other medical issues, concerns and or health care needs.    Signed: Gerlene Burdock Dangela How, FNP 02/07/2017, 11:30 AM

## 2018-01-17 DEATH — deceased

## 2018-02-06 IMAGING — CR DG CHEST 2V
2 series · 2 of 2 positions shown · non-contrast
Comparison: 05/12/2014

CLINICAL DATA: Short of breath

EXAM:
CHEST  2 VIEW

[w chest pa]
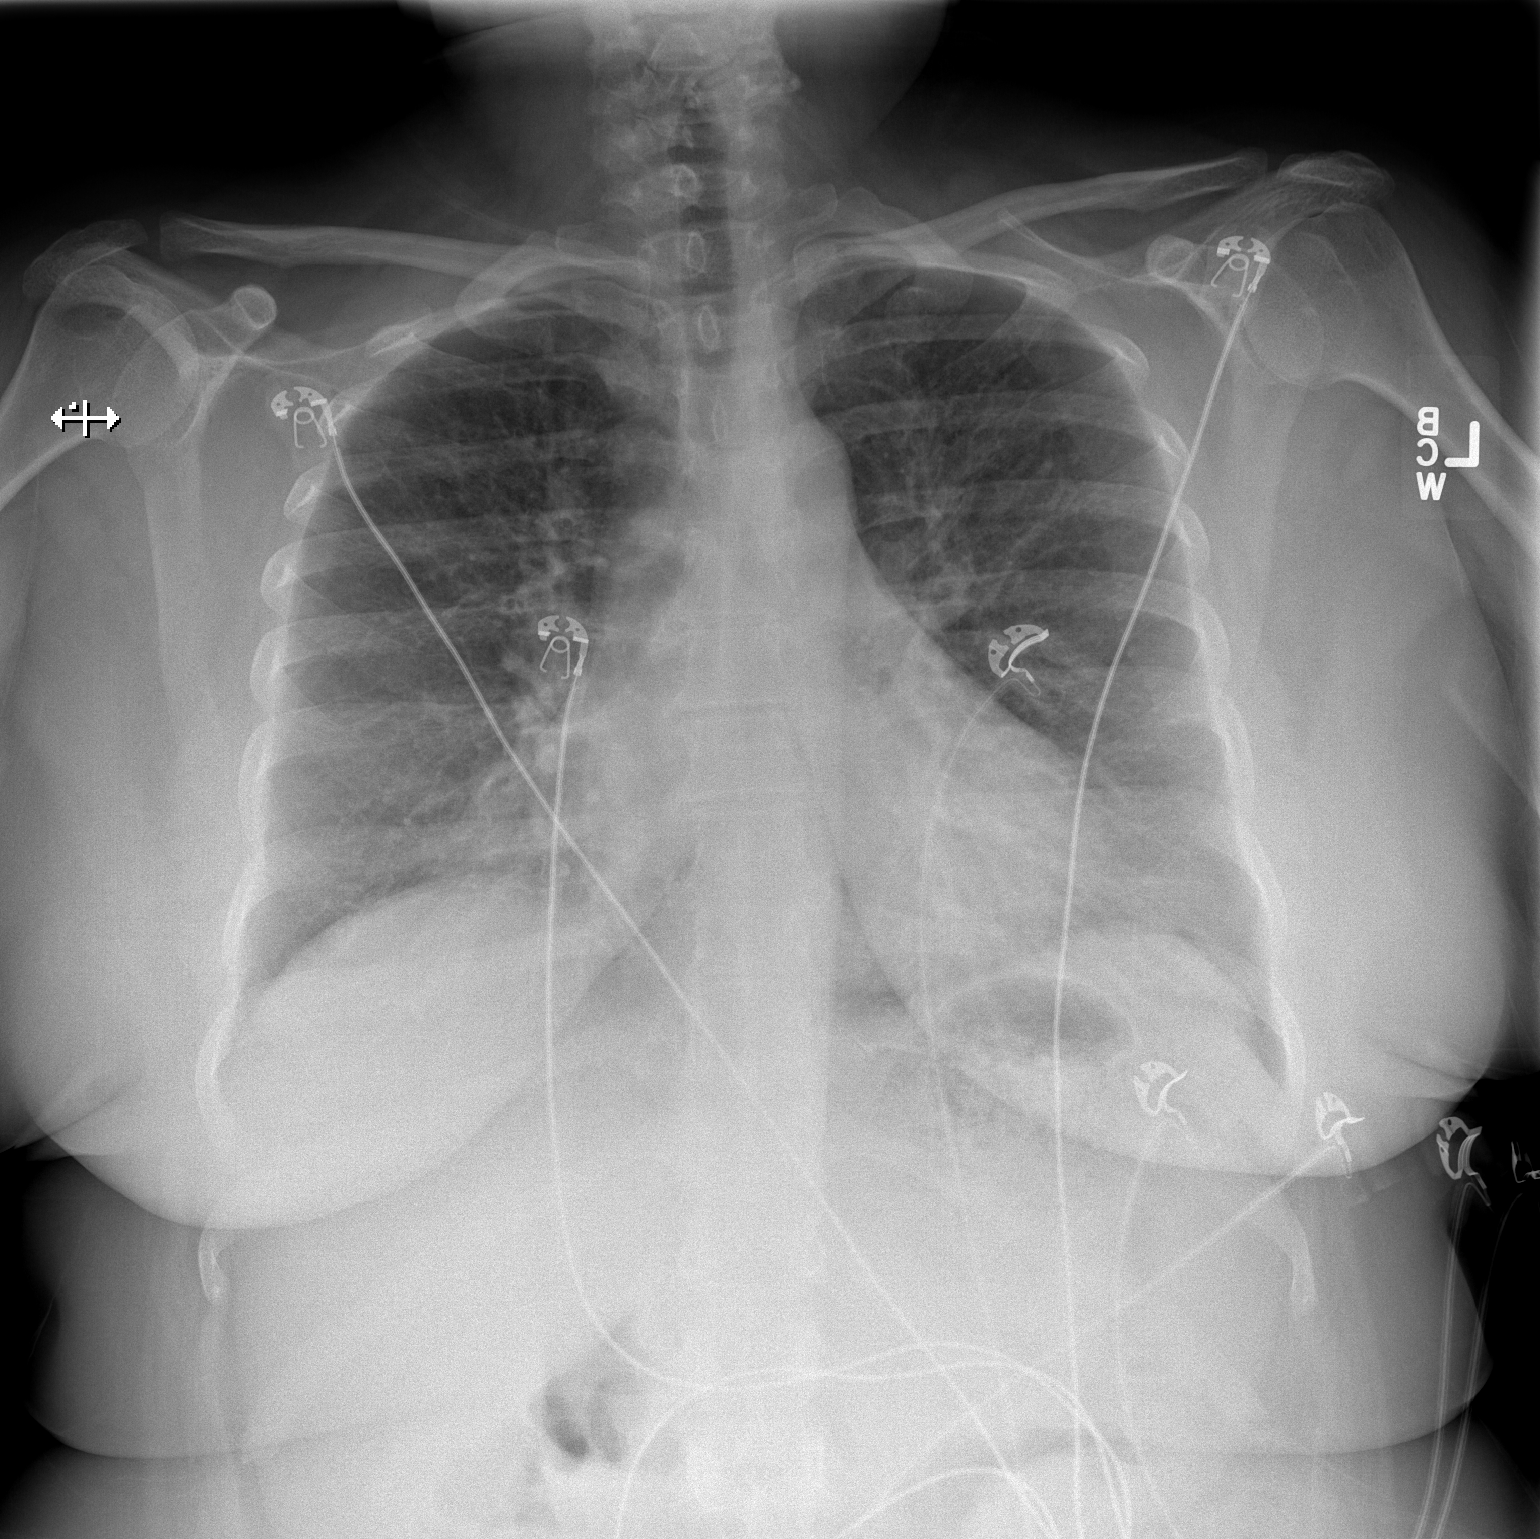

[w chest lat]
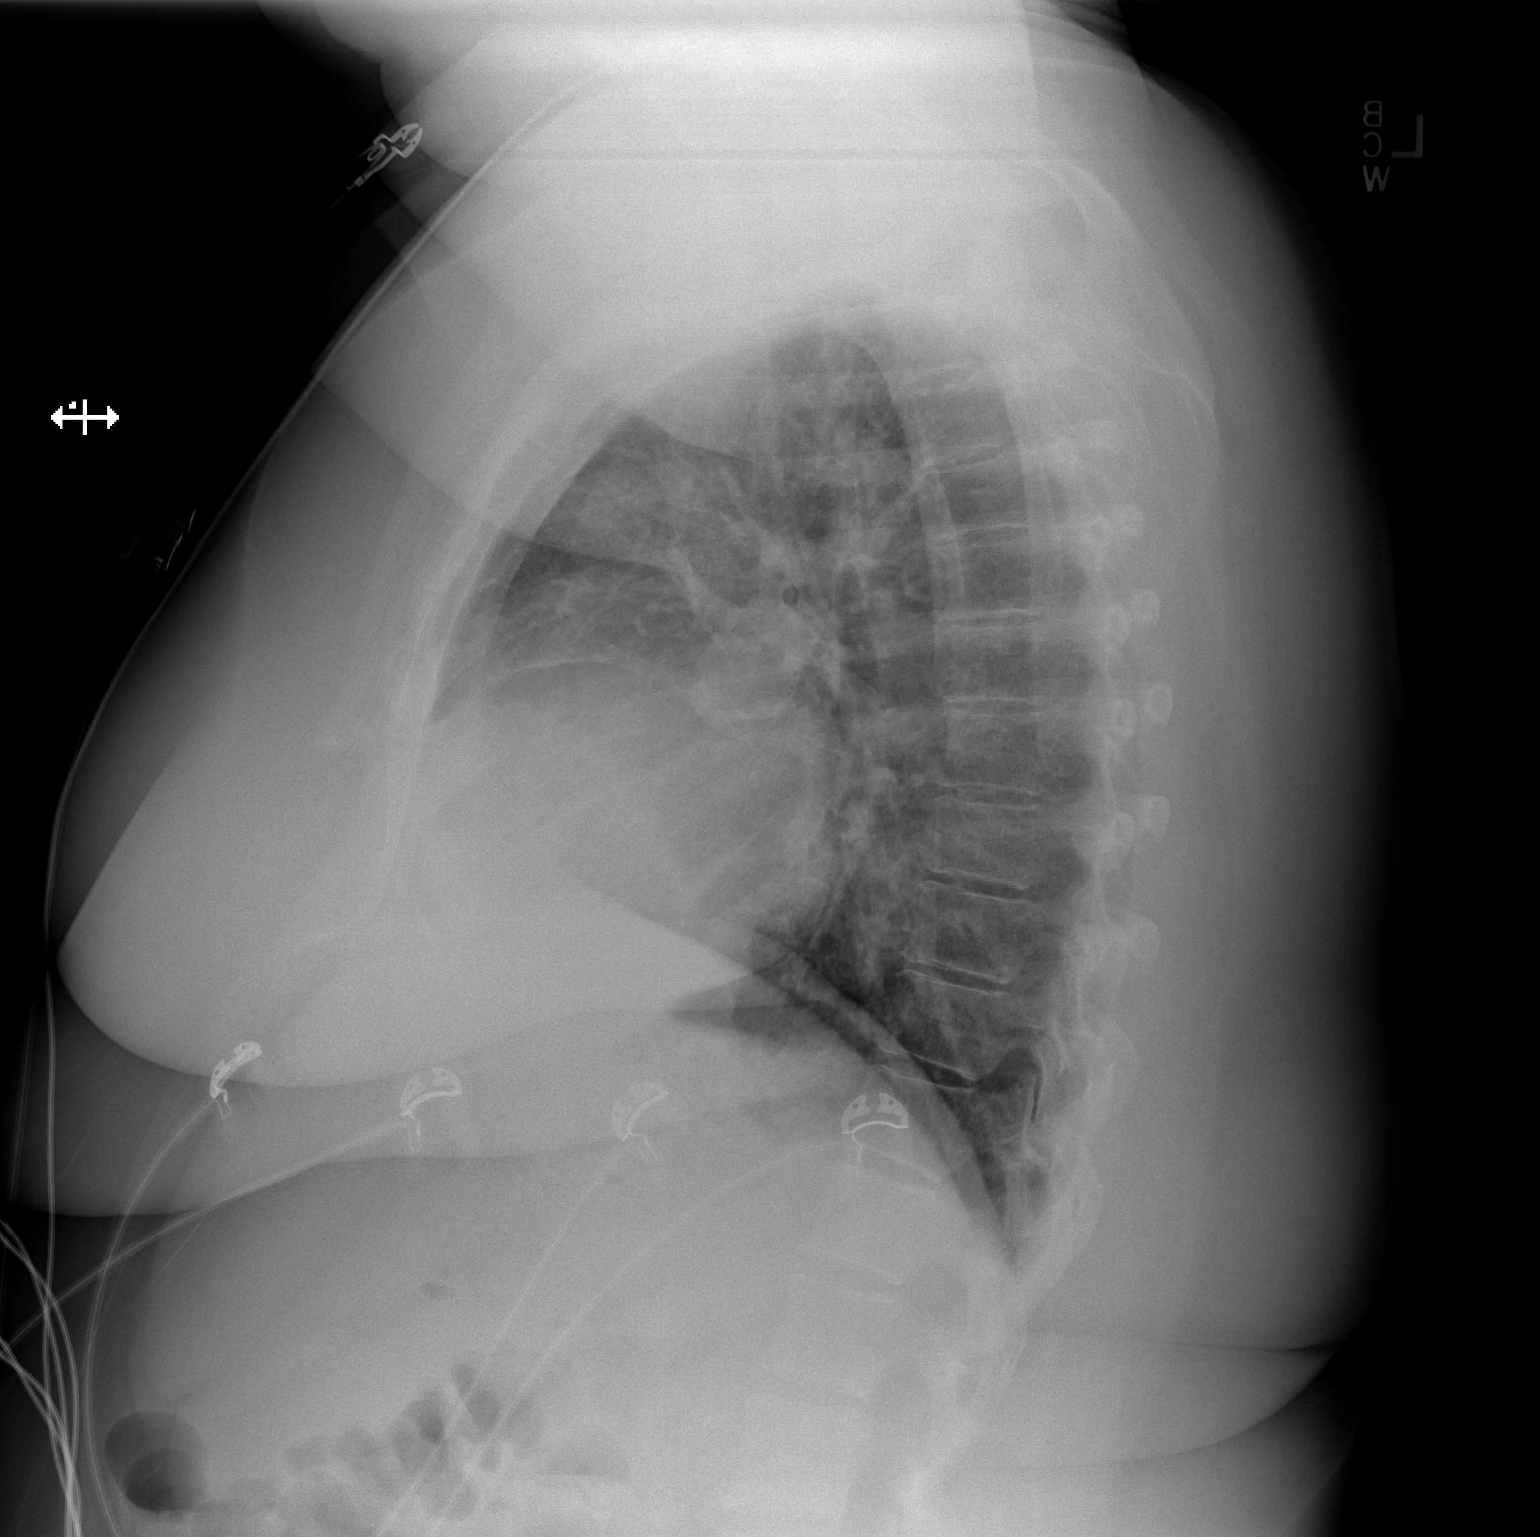

[2 of 2 positions shown; findings below may reference images not displayed]

FINDINGS: Normal heart size. Low lung volumes with bibasilar atelectasis. No
pneumothorax. No pleural effusion.
IMPRESSION: Bibasilar atelectasis.
